# Patient Record
Sex: Female | Born: 1945 | Race: White | Hispanic: No | Marital: Married | Smoking: Former smoker
Health system: Southern US, Community
[De-identification: ages and names within clinical notes are randomized; demographics above are authoritative.]

## PROBLEM LIST (undated history)

## (undated) DIAGNOSIS — C3491 Malignant neoplasm of unspecified part of right bronchus or lung: Secondary | ICD-10-CM

## (undated) DIAGNOSIS — M545 Low back pain, unspecified: Secondary | ICD-10-CM

## (undated) DIAGNOSIS — M1990 Unspecified osteoarthritis, unspecified site: Secondary | ICD-10-CM

## (undated) HISTORY — PX: NO PAST SURGERIES: SHX2092

## (undated) HISTORY — DX: Malignant neoplasm of unspecified part of right bronchus or lung: C34.91

---

## 2011-12-09 ENCOUNTER — Encounter (HOSPITAL_COMMUNITY): Payer: Self-pay

## 2011-12-09 ENCOUNTER — Emergency Department (HOSPITAL_COMMUNITY)
Admission: EM | Admit: 2011-12-09 | Discharge: 2011-12-09 | Disposition: A | Discharge status: Home / Self Care | Payer: Medicare Other | Attending: Emergency Medicine | Admitting: Emergency Medicine

## 2011-12-09 DIAGNOSIS — F17200 Nicotine dependence, unspecified, uncomplicated: Secondary | ICD-10-CM | POA: Insufficient documentation

## 2011-12-09 DIAGNOSIS — K0889 Other specified disorders of teeth and supporting structures: Secondary | ICD-10-CM

## 2011-12-09 DIAGNOSIS — K029 Dental caries, unspecified: Secondary | ICD-10-CM | POA: Insufficient documentation

## 2011-12-09 MED ORDER — OXYCODONE-ACETAMINOPHEN 5-325 MG PO TABS: 1.0000 | ORAL_TABLET | Freq: Four times a day (QID) | ORAL | Status: AC | PRN

## 2011-12-09 MED ORDER — PENICILLIN V POTASSIUM 500 MG PO TABS: 500.0000 mg | ORAL_TABLET | Freq: Three times a day (TID) | ORAL | Status: AC

## 2011-12-09 MED ORDER — IBUPROFEN 600 MG PO TABS: 600.0000 mg | ORAL_TABLET | Freq: Four times a day (QID) | ORAL | Status: AC | PRN

## 2011-12-09 NOTE — ED Notes (Signed)
Pt complains of dental pain sts broken left frontal tooth and now infected, occurred thursdya

## 2011-12-09 NOTE — ED Provider Notes (Signed)
History  Scribed for Ethelda Chick, MD, the patient was seen in room TR06C/TR06C. This chart was scribed by Candelaria Stagers. The patient's care started at 1:19 PM   CSN: [id number]  Arrival date & time 12/09/11  1254   First MD Initiated Contact with Patient 12/09/11 1312      Chief Complaint  Patient presents with  . Dental Pain    The history is provided by the patient.   Julia Nguyen is a 66 y.o. female who presents to the Emergency Department complaining of left sided dental pain that started three days ago.  Pt reports that the tooth is broken.  She denies fevers, trouble swallowing.  Nothing seems to make the pain better or worse.  Sh has tried tylenol prior to arrival that did not help with the pain.  There are no other associated systemic symptoms, there are no other alleviating or modifying factors.  No past medical history on file.  No past surgical history on file.  No family history on file.  History  Substance Use Topics  . Smoking status: Current Everyday Smoker  . Smokeless tobacco: Not on file  . Alcohol Use: No    OB History    Grav Para Term Preterm Abortions TAB SAB Ect Mult Living                  Review of Systems  HENT: Positive for dental problem. Negative for trouble swallowing and neck pain.   All other systems reviewed and are negative.    Allergies  Review of patient's allergies indicates no known allergies.  Home Medications   Current Outpatient Rx  Name Route Sig Dispense Refill  . ACETAMINOPHEN 500 MG PO TABS Oral Take 1,000 mg by mouth every 6 (six) hours as needed. pain    . IBUPROFEN 600 MG PO TABS Oral Take 1 tablet (600 mg total) by mouth every 6 (six) hours as needed for pain. 30 tablet 0  . OXYCODONE-ACETAMINOPHEN 5-325 MG PO TABS Oral Take 1-2 tablets by mouth every 6 (six) hours as needed for pain. 15 tablet 0  . PENICILLIN V POTASSIUM 500 MG PO TABS Oral Take 1 tablet (500 mg total) by mouth 3 (three) times daily. 30  tablet 0    BP 152/71  Pulse 93  Temp 98.5 F (36.9 C) (Oral)  SpO2 94%  Physical Exam  Nursing note and vitals reviewed. Constitutional: She is oriented to person, place, and time. She appears well-developed and well-nourished. No distress.  HENT:  Head: Normocephalic and atraumatic.       Left lower premolars eroded to the gumline with gingival swelling and buckle surface.  No swelling under the tongue.  Diffuse decay.    Eyes: Conjunctivae are normal.  Musculoskeletal: Normal range of motion. She exhibits no tenderness.  Neurological: She is alert and oriented to person, place, and time.  Skin: Skin is warm and dry. She is not diaphoretic.  Psychiatric: She has a normal mood and affect. Her behavior is normal.    ED Course  Procedures  DIAGNOSTIC STUDIES: Oxygen Saturation is 94% on room air, normal by my interpretation.    COORDINATION OF CARE:    Labs Reviewed - No data to display No results found.   1. Pain, dental       MDM  Pt presenting with dental pain, she has poor dentition and tooth causing pain is broken down to the gumline.  She is overall nontoxic and well hydrated  in appearance.  She was discharged with rx for pain meds, antibiotics.  Encouraged to arrange followup with dentist for definitive care.  Discharged with strict return precautions.  Pt agreeable with plan.   I personally performed the services described in this documentation, which was scribed in my presence. The recorded information has been reviewed and considered.        Ethelda Chick, MD 12/09/11 1352

## 2017-08-19 ENCOUNTER — Other Ambulatory Visit: Payer: Self-pay

## 2017-08-19 ENCOUNTER — Emergency Department (HOSPITAL_COMMUNITY)
Admission: EM | Admit: 2017-08-19 | Discharge: 2017-08-19 | Disposition: A | Discharge status: Home / Self Care | Payer: Medicare Other | Attending: Emergency Medicine | Admitting: Emergency Medicine

## 2017-08-19 DIAGNOSIS — F17200 Nicotine dependence, unspecified, uncomplicated: Secondary | ICD-10-CM | POA: Diagnosis not present

## 2017-08-19 DIAGNOSIS — M5432 Sciatica, left side: Secondary | ICD-10-CM

## 2017-08-19 DIAGNOSIS — M545 Low back pain: Secondary | ICD-10-CM | POA: Diagnosis present

## 2017-08-19 MED ORDER — TRAMADOL HCL 50 MG PO TABS: 50.0000 mg | ORAL_TABLET | Freq: Four times a day (QID) | ORAL | 0 refills | Status: DC | PRN

## 2017-08-19 MED ORDER — TRAMADOL HCL 50 MG PO TABS
50.0000 mg | ORAL_TABLET | Freq: Once | ORAL | Status: AC
  Administered 2017-08-19: 50 mg via ORAL
  Filled 2017-08-19: qty 1

## 2017-08-19 MED ORDER — METHOCARBAMOL 500 MG PO TABS: 500.0000 mg | ORAL_TABLET | Freq: Two times a day (BID) | ORAL | 0 refills | Status: DC

## 2017-08-19 NOTE — ED Triage Notes (Signed)
Patient here with ongoing back pain for approx the last five weeks; states she first felt a pull on her lower back when giving herself a pedicure.  Here today for a particularly painful back spasm last night  Denies numbness/tingling

## 2017-08-19 NOTE — ED Provider Notes (Signed)
[facility] Provider Note   CSN: [id number] Arrival date & time: 08/19/17  1353     History   Chief Complaint Chief Complaint  Patient presents with  . Back Pain    HPI Julia Nguyen is a 72 y.o. female.  HPI   72 year old female presents today with complaints of left lower back pain.  She notes a 5-week history of ongoing left lower back pain.  She notes that she had crossed her left leg over her right and wedged under table.  She notes feeling a "tearing sensation".  She notes some soreness after this but then resolved without comp occasion.  She notes several weeks later symptoms again returned with severe left lower back pain with radiation into the buttocks.  She denies any loss of distal sensation strength and motor function, normal bowel and bladder movements.  Patient reports using over-the-counter medications without symptom medic improvement.  She denies any fever or acute trauma.  No abdominal pain.  Patient notes she has had outpatient x-rays with no significant findings.  No past medical history on file.  There are no active problems to display for this patient.   OB History   None      Home Medications    Prior to Admission medications   Medication Sig Start Date End Date Taking? Authorizing Provider  acetaminophen (TYLENOL) 500 MG tablet Take 1,000 mg by mouth every 6 (six) hours as needed. pain    [provider]  methocarbamol (ROBAXIN) 500 MG tablet Take 1 tablet (500 mg total) by mouth 2 (two) times daily. 08/19/17   Cindel Daugherty, Dellis Filbert, PA-C  traMADol (ULTRAM) 50 MG tablet Take 1 tablet (50 mg total) by mouth every 6 (six) hours as needed. 08/19/17   Okey Regal, PA-C    Family History No family history on file.  Social History Social History   Tobacco Use  . Smoking status: Current Every Day Smoker  Substance Use Topics  . Alcohol use: No  . Drug use: No     Allergies   Patient has no known  allergies.   Review of Systems Review of Systems  All other systems reviewed and are negative.    Physical Exam Updated Vital Signs BP (!) 155/72   Pulse 85   Temp 98.5 F (36.9 C)   Resp 16   Ht 5\' 5"  (1.651 m)   Wt 68 kg (150 lb)   SpO2 99%   BMI 24.96 kg/m   Physical Exam  Constitutional: She is oriented to person, place, and time. She appears well-developed and well-nourished.  HENT:  Head: Normocephalic and atraumatic.  Eyes: Pupils are equal, round, and reactive to light. Conjunctivae are normal. Right eye exhibits no discharge. Left eye exhibits no discharge. No scleral icterus.  Neck: Normal range of motion. No JVD present. No tracheal deviation present.  Pulmonary/Chest: Effort normal. No stridor.  Abdominal: Soft. She exhibits no distension and no mass. There is no tenderness. There is no rebound and no guarding. No hernia.  Musculoskeletal:  No CT or L-spine tenderness palpation, tenderness palpation left lateral musculature, left posterior hip and gluteus-bilateral lower extremity sensation strength motor function intact  Neurological: She is alert and oriented to person, place, and time. Coordination normal.  Psychiatric: She has a normal mood and affect. Her behavior is normal. Judgment and thought content normal.  Nursing note and vitals reviewed.    ED Treatments / Results  Labs (all labs ordered are listed,  but only abnormal results are displayed) Labs Reviewed - No data to display  EKG None  Radiology No results found.  Procedures Procedures (including critical care time)  Medications Ordered in ED Medications  traMADol (ULTRAM) tablet 50 mg (50 mg Oral Given 08/19/17 1750)     Initial Impression / Assessment and Plan / ED Course  I have reviewed the triage vital signs and the nursing notes.  Pertinent labs & imaging results that were available during my care of the patient were reviewed by me and considered in my medical decision making  (see chart for details).      Final Clinical Impressions(s) / ED Diagnoses   Final diagnoses:  Sciatica of left side    Labs:   Imaging:  Consults:  Therapeutics:  Discharge Meds: Robaxin, Ultram  Assessment/Plan: 72 year old female presents today with likely sciatica.  Patient has outpatient imaging with no acute findings.  Continuation of symptoms.  Patient will be started on Ultram, Robaxin, and referred to neurosurgery for ongoing evaluation and management.  No red flags here today requiring further imaging.  She is given strict return precautions, she verbalized understanding and agreement to today's plan had no further questions or concerns.   ED Discharge Orders        Ordered    traMADol (ULTRAM) 50 MG tablet  Every 6 hours PRN     08/19/17 1743    methocarbamol (ROBAXIN) 500 MG tablet  2 times daily     08/19/17 1743       Francee Gentile 08/20/17 1312    Davonna Belling, MD 08/20/17 2214

## 2017-08-19 NOTE — Discharge Instructions (Addendum)
Please read attached information. If you experience any new or worsening signs or symptoms please return to the emergency room for evaluation. Please follow-up with your primary care provider or specialist as discussed. Please use medication prescribed only as directed and discontinue taking if you have any concerning signs or symptoms.   °

## 2017-08-19 NOTE — ED Triage Notes (Signed)
States feels "hot and cold" in right leg

## 2017-09-05 ENCOUNTER — Encounter (HOSPITAL_COMMUNITY): Payer: Self-pay | Admitting: Emergency Medicine

## 2017-09-05 ENCOUNTER — Other Ambulatory Visit: Payer: Self-pay

## 2017-09-05 ENCOUNTER — Emergency Department (HOSPITAL_COMMUNITY)
Admission: EM | Admit: 2017-09-05 | Discharge: 2017-09-05 | Disposition: A | Discharge status: Home / Self Care | Payer: Medicare Other | Attending: Emergency Medicine | Admitting: Emergency Medicine

## 2017-09-05 DIAGNOSIS — M5432 Sciatica, left side: Secondary | ICD-10-CM | POA: Insufficient documentation

## 2017-09-05 DIAGNOSIS — Z79899 Other long term (current) drug therapy: Secondary | ICD-10-CM | POA: Diagnosis not present

## 2017-09-05 DIAGNOSIS — M545 Low back pain: Secondary | ICD-10-CM | POA: Diagnosis present

## 2017-09-05 DIAGNOSIS — F17200 Nicotine dependence, unspecified, uncomplicated: Secondary | ICD-10-CM | POA: Diagnosis not present

## 2017-09-05 LAB — URINALYSIS, ROUTINE W REFLEX MICROSCOPIC
Bacteria, UA: NONE SEEN
Bilirubin Urine: NEGATIVE
Glucose, UA: NEGATIVE mg/dL
Ketones, ur: 20 mg/dL — AB
Nitrite: NEGATIVE
PH: 5 (ref 5.0–8.0)
PROTEIN: NEGATIVE mg/dL
Specific Gravity, Urine: 1.019 (ref 1.005–1.030)

## 2017-09-05 MED ORDER — LIDOCAINE 5 % EX PTCH: 1.0000 | MEDICATED_PATCH | CUTANEOUS | 0 refills | Status: DC

## 2017-09-05 MED ORDER — CEPHALEXIN 500 MG PO CAPS: 500.0000 mg | ORAL_CAPSULE | Freq: Four times a day (QID) | ORAL | 0 refills | Status: DC

## 2017-09-05 MED ORDER — METHOCARBAMOL 1000 MG/10ML IJ SOLN: 1000.0000 mg | Freq: Once | INTRAMUSCULAR | Status: DC

## 2017-09-05 MED ORDER — MINERAL OIL RE ENEM
1.0000 | ENEMA | Freq: Once | RECTAL | Status: AC
  Administered 2017-09-05: 1 via RECTAL
  Filled 2017-09-05: qty 1

## 2017-09-05 MED ORDER — METHOCARBAMOL 500 MG PO TABS
750.0000 mg | ORAL_TABLET | Freq: Once | ORAL | Status: AC
  Administered 2017-09-05: 750 mg via ORAL
  Filled 2017-09-05: qty 2

## 2017-09-05 MED ORDER — KETOROLAC TROMETHAMINE 60 MG/2ML IM SOLN
30.0000 mg | Freq: Once | INTRAMUSCULAR | Status: AC
  Administered 2017-09-05: 30 mg via INTRAMUSCULAR
  Filled 2017-09-05: qty 2

## 2017-09-05 MED ORDER — METHOCARBAMOL 500 MG PO TABS: 500.0000 mg | ORAL_TABLET | Freq: Three times a day (TID) | ORAL | 0 refills | Status: DC | PRN

## 2017-09-05 MED ORDER — LIDOCAINE 5 % EX PTCH
1.0000 | MEDICATED_PATCH | CUTANEOUS | Status: DC
Start: 2017-09-05 — End: 2017-09-06
  Administered 2017-09-05: 1 via TRANSDERMAL
  Filled 2017-09-05: qty 1

## 2017-09-05 NOTE — ED Triage Notes (Signed)
Pt to ED with c/o back pain.  Pt st's unable to sit.  Pt also st's pain meds she is taking are not helping.  Pt also c/o constipation

## 2017-09-05 NOTE — ED Triage Notes (Signed)
Pt st's she has sciatica on the left and is having a lot of pain.  St's she is unable to sit down.  Pt has appointment with Neurosurgery and [facility] on Monday but can't wait that long.  Pt also c/o constipation.

## 2017-09-05 NOTE — ED Notes (Signed)
Patient verbalized understanding of discharge instructions and denies any further needs or questions at this time. VS stable. Patient ambulatory with steady gait.  

## 2017-09-05 NOTE — ED Provider Notes (Signed)
[facility]PART[facility] Provider Note   CSN: [id number] Arrival date & time: 09/05/17  1941     History   Chief Complaint Chief Complaint  Patient presents with  . Back Pain    HPI Julia Nguyen is a 72 y.o. female.  Patient is a 72 year old female with no significant past medical history who is presenting today with 8 weeks of ongoing back pain that is not getting any better.  Patient states that the pain is in the lower back and radiates off to the left side causing severe spasm.  She has pain that shoots down into her knee and numbness and weakness in her left leg.  Patient states that this is not significantly different from when the pain started.  She is also having pain that radiates over into the right side as well.  She states she is having a large bouts of urination and intermittent incontinence which she states is not terribly different than her baseline.  However now she is also feeling constipated.  Patient's last bowel movement was on Tuesday if she feels that she can just not have a bowel movement despite trying to take some stool softeners.  She is having to strain in her back is so uncomfortable she is having a hard time doing that.  She denies fever, abdominal pain.  Patient has a planned appointment with neurosurgery on Monday and has not had any imaging thus far.  She has been using Aleve and Robaxin at home with only minimal improvement.  She tried steroids when symptoms initially started without improvement of symptoms.  The history is provided by the patient and the spouse.    History reviewed. No pertinent past medical history.  There are no active problems to display for this patient.   History reviewed. No pertinent surgical history.   OB History   None      Home Medications    Prior to Admission medications   Medication Sig Start Date End Date Taking? Authorizing Provider  Meclizine HCl (BONINE) 25 MG CHEW Chew 1 tablet by  mouth as needed.   Yes [provider]  methocarbamol (ROBAXIN) 500 MG tablet Take 1 tablet (500 mg total) by mouth 2 (two) times daily. 08/19/17  Yes Hedges, Dellis Filbert, PA-C  naproxen sodium (ALEVE) 220 MG tablet Take 440 mg by mouth.   Yes [provider]  traMADol (ULTRAM) 50 MG tablet Take 1 tablet (50 mg total) by mouth every 6 (six) hours as needed. Patient not taking: Reported on 09/05/2017 08/19/17   Okey Regal, PA-C    Family History No family history on file.  Social History Social History   Tobacco Use  . Smoking status: Current Every Day Smoker  . Smokeless tobacco: Never Used  Substance Use Topics  . Alcohol use: No  . Drug use: No     Allergies   Patient has no known allergies.   Review of Systems Review of Systems  Genitourinary:       Strong smelling urine  All other systems reviewed and are negative.    Physical Exam Updated Vital Signs BP (!) 136/45 (BP Location: Left Arm)   Pulse 77   Temp 98.3 F (36.8 C) (Oral)   Resp 16   Ht 5\' 5"  (1.651 m)   Wt 69.9 kg (154 lb)   SpO2 99%   BMI 25.63 kg/m   Physical Exam  Constitutional: She is oriented to person, place, and time. She appears well-developed  and well-nourished. She appears distressed.  Looks uncomfortable  HENT:  Head: Normocephalic and atraumatic.  Mouth/Throat: Oropharynx is clear and moist.  Eyes: Pupils are equal, round, and reactive to light. Conjunctivae and EOM are normal.  Neck: Normal range of motion. Neck supple.  Cardiovascular: Normal rate, regular rhythm and intact distal pulses.  No murmur heard. Pulmonary/Chest: Effort normal and breath sounds normal. No respiratory distress. She has no wheezes. She has no rales.  Abdominal: Soft. She exhibits no distension. There is no tenderness. There is no rebound and no guarding.  Musculoskeletal: She exhibits tenderness. She exhibits no edema.       Lumbar back: She exhibits decreased range of motion, tenderness,  pain and spasm. She exhibits normal pulse.       Back:  Neurological: She is alert and oriented to person, place, and time.  Decreased sensation in the left lower ext to pin prick.  5/5 foot dorsiflex/plantar flexion, 3/5 strength in the quadricep muscle.  No foot drop but gait is slow and limp with the left leg.  Rectal tone intact and minimal urine in her bladder after voiding.  Skin: Skin is warm and dry. No rash noted. No erythema.  Psychiatric: She has a normal mood and affect. Her behavior is normal.  Nursing note and vitals reviewed.    ED Treatments / Results  Labs (all labs ordered are listed, but only abnormal results are displayed) Labs Reviewed  URINALYSIS, ROUTINE W REFLEX MICROSCOPIC - Abnormal; Notable for the following components:      Result Value   Hgb urine dipstick MODERATE (*)    Ketones, ur 20 (*)    Leukocytes, UA LARGE (*)    Squamous Epithelial / LPF 0-5 (*)    All other components within normal limits  URINE CULTURE    EKG None  Radiology No results found.  Procedures Procedures (including critical care time)  Medications Ordered in ED Medications  mineral oil enema 1 enema (has no administration in time range)  lidocaine (LIDODERM) 5 % 1 patch (has no administration in time range)  methocarbamol (ROBAXIN) injection 1,000 mg (has no administration in time range)  ketorolac (TORADOL) injection 30 mg (30 mg Intramuscular Given 09/05/17 2220)     Initial Impression / Assessment and Plan / ED Course  I have reviewed the triage vital signs and the nursing notes.  Pertinent labs & imaging results that were available during my care of the patient were reviewed by me and considered in my medical decision making (see chart for details).     Pt with gradual onset of back pain suggestive of radiculopathy.  Some weakness in the left lower leg but unchanged from the last month and no incontinence with good rectal tone.  Pt has no infectious sx, hx of CA   or other red flags concerning for pathologic back pain.  Pt is able to ambulate but is painful.   Denies trauma.  Patient has follow-up with neurosurgery on Monday however because of the pain, constipation she states she just could not take it anymore.  Offered patient an MRI but she is refusing because she is claustrophobic.  Will treat pain with Toradol and Robaxin as she is refusing narcotics because they make her feel weird and do not help with the pain.  We will also try Lidoderm patch.  Feel that patient has a bulging disc and nerve impingement but is not displaying signs of cauda equina at this time.  Patient's vital signs are  reassuring and low suspicion for AAA. Will give pt pain control and to return for developement of above sx.  Pt is requesting discharge states she can't stay any longer  11:42 PM Patient's urine returned and questionable urinary tract infection with large leukocytes, 6-30 white blood cells and moderate hemoglobin.  Patient called in a prescription for Keflex and cultures sent.  Final Clinical Impressions(s) / ED Diagnoses   Final diagnoses:  Sciatica of left side    ED Discharge Orders        Ordered    lidocaine (LIDODERM) 5 %  Every 24 hours     09/05/17 2303    methocarbamol (ROBAXIN) 500 MG tablet  Every 8 hours PRN     09/05/17 2303    cephALEXin (KEFLEX) 500 MG capsule  4 times daily     09/05/17 2344       Blanchie Dessert, MD 09/05/17 2344

## 2017-09-05 NOTE — ED Notes (Signed)
Patient assisted to restroom in wheelchair for urine sample. Patient began having muscle spasm while attempting to sit on toilet. Provided heat pack for a few minutes until spasm resolved. Patient ambulated back to room with slow, steady gait.

## 2017-09-12 ENCOUNTER — Other Ambulatory Visit: Payer: Self-pay | Admitting: Neurosurgery

## 2017-09-12 DIAGNOSIS — M549 Dorsalgia, unspecified: Secondary | ICD-10-CM

## 2017-09-15 ENCOUNTER — Ambulatory Visit
Admission: RE | Admit: 2017-09-15 | Discharge: 2017-09-15 | Disposition: A | Discharge status: Home / Self Care | Payer: Medicare Other | Source: Ambulatory Visit | Attending: Neurosurgery | Admitting: Neurosurgery

## 2017-09-15 DIAGNOSIS — M549 Dorsalgia, unspecified: Secondary | ICD-10-CM

## 2017-09-17 ENCOUNTER — Inpatient Hospital Stay (HOSPITAL_COMMUNITY)
Admission: AD | Admit: 2017-09-17 | Discharge: 2017-09-30 | DRG: 478 | Disposition: A | Discharge status: Skilled Nursing Facility | Payer: Medicare Other | Source: Ambulatory Visit | Attending: Internal Medicine | Admitting: Internal Medicine

## 2017-09-17 ENCOUNTER — Encounter (HOSPITAL_COMMUNITY): Payer: Self-pay | Admitting: *Deleted

## 2017-09-17 ENCOUNTER — Observation Stay (HOSPITAL_COMMUNITY): Payer: Medicare Other

## 2017-09-17 DIAGNOSIS — N2889 Other specified disorders of kidney and ureter: Secondary | ICD-10-CM | POA: Diagnosis not present

## 2017-09-17 DIAGNOSIS — C3491 Malignant neoplasm of unspecified part of right bronchus or lung: Secondary | ICD-10-CM | POA: Diagnosis not present

## 2017-09-17 DIAGNOSIS — C3411 Malignant neoplasm of upper lobe, right bronchus or lung: Secondary | ICD-10-CM | POA: Diagnosis present

## 2017-09-17 DIAGNOSIS — C799 Secondary malignant neoplasm of unspecified site: Secondary | ICD-10-CM

## 2017-09-17 DIAGNOSIS — D497 Neoplasm of unspecified behavior of endocrine glands and other parts of nervous system: Secondary | ICD-10-CM | POA: Diagnosis not present

## 2017-09-17 DIAGNOSIS — C729 Malignant neoplasm of central nervous system, unspecified: Secondary | ICD-10-CM | POA: Diagnosis not present

## 2017-09-17 DIAGNOSIS — K5900 Constipation, unspecified: Secondary | ICD-10-CM | POA: Diagnosis present

## 2017-09-17 DIAGNOSIS — R011 Cardiac murmur, unspecified: Secondary | ICD-10-CM | POA: Diagnosis not present

## 2017-09-17 DIAGNOSIS — Z801 Family history of malignant neoplasm of trachea, bronchus and lung: Secondary | ICD-10-CM | POA: Diagnosis not present

## 2017-09-17 DIAGNOSIS — R590 Localized enlarged lymph nodes: Secondary | ICD-10-CM | POA: Diagnosis not present

## 2017-09-17 DIAGNOSIS — G893 Neoplasm related pain (acute) (chronic): Secondary | ICD-10-CM | POA: Diagnosis present

## 2017-09-17 DIAGNOSIS — L8990 Pressure ulcer of unspecified site, unspecified stage: Secondary | ICD-10-CM | POA: Diagnosis present

## 2017-09-17 DIAGNOSIS — S32001A Stable burst fracture of unspecified lumbar vertebra, initial encounter for closed fracture: Secondary | ICD-10-CM | POA: Diagnosis present

## 2017-09-17 DIAGNOSIS — C7949 Secondary malignant neoplasm of other parts of nervous system: Secondary | ICD-10-CM | POA: Diagnosis not present

## 2017-09-17 DIAGNOSIS — K5909 Other constipation: Secondary | ICD-10-CM

## 2017-09-17 DIAGNOSIS — N289 Disorder of kidney and ureter, unspecified: Secondary | ICD-10-CM | POA: Diagnosis not present

## 2017-09-17 DIAGNOSIS — R6881 Early satiety: Secondary | ICD-10-CM | POA: Diagnosis not present

## 2017-09-17 DIAGNOSIS — J439 Emphysema, unspecified: Secondary | ICD-10-CM | POA: Diagnosis present

## 2017-09-17 DIAGNOSIS — M8448XA Pathological fracture, other site, initial encounter for fracture: Secondary | ICD-10-CM | POA: Diagnosis present

## 2017-09-17 DIAGNOSIS — R42 Dizziness and giddiness: Secondary | ICD-10-CM | POA: Diagnosis present

## 2017-09-17 DIAGNOSIS — S32001D Stable burst fracture of unspecified lumbar vertebra, subsequent encounter for fracture with routine healing: Secondary | ICD-10-CM | POA: Diagnosis not present

## 2017-09-17 DIAGNOSIS — F17210 Nicotine dependence, cigarettes, uncomplicated: Secondary | ICD-10-CM | POA: Diagnosis present

## 2017-09-17 DIAGNOSIS — C3490 Malignant neoplasm of unspecified part of unspecified bronchus or lung: Secondary | ICD-10-CM

## 2017-09-17 DIAGNOSIS — K639 Disease of intestine, unspecified: Secondary | ICD-10-CM | POA: Diagnosis present

## 2017-09-17 DIAGNOSIS — D72829 Elevated white blood cell count, unspecified: Secondary | ICD-10-CM | POA: Diagnosis present

## 2017-09-17 DIAGNOSIS — R911 Solitary pulmonary nodule: Secondary | ICD-10-CM | POA: Diagnosis not present

## 2017-09-17 DIAGNOSIS — S32001S Stable burst fracture of unspecified lumbar vertebra, sequela: Secondary | ICD-10-CM

## 2017-09-17 DIAGNOSIS — K6389 Other specified diseases of intestine: Secondary | ICD-10-CM

## 2017-09-17 DIAGNOSIS — T380X5A Adverse effect of glucocorticoids and synthetic analogues, initial encounter: Secondary | ICD-10-CM | POA: Diagnosis present

## 2017-09-17 DIAGNOSIS — C7952 Secondary malignant neoplasm of bone marrow: Secondary | ICD-10-CM

## 2017-09-17 DIAGNOSIS — M8458XA Pathological fracture in neoplastic disease, other specified site, initial encounter for fracture: Principal | ICD-10-CM | POA: Diagnosis present

## 2017-09-17 DIAGNOSIS — M8448XP Pathological fracture, other site, subsequent encounter for fracture with malunion: Secondary | ICD-10-CM | POA: Diagnosis not present

## 2017-09-17 DIAGNOSIS — C7951 Secondary malignant neoplasm of bone: Secondary | ICD-10-CM

## 2017-09-17 HISTORY — DX: Low back pain: M54.5

## 2017-09-17 HISTORY — DX: Low back pain, unspecified: M54.50

## 2017-09-17 HISTORY — DX: Unspecified osteoarthritis, unspecified site: M19.90

## 2017-09-17 LAB — COMPREHENSIVE METABOLIC PANEL
ALBUMIN: 3.1 g/dL — AB (ref 3.5–5.0)
ALK PHOS: 71 U/L (ref 38–126)
ALT: 22 U/L (ref 14–54)
AST: 19 U/L (ref 15–41)
Anion gap: 10 (ref 5–15)
BUN: 16 mg/dL (ref 6–20)
CHLORIDE: 102 mmol/L (ref 101–111)
CO2: 24 mmol/L (ref 22–32)
Calcium: 9.3 mg/dL (ref 8.9–10.3)
Creatinine, Ser: 0.76 mg/dL (ref 0.44–1.00)
GFR calc non Af Amer: >60 mL/min (ref >60)
GLUCOSE: 140 mg/dL — AB (ref 65–99)
POTASSIUM: 4.2 mmol/L (ref 3.5–5.1)
SODIUM: 136 mmol/L (ref 135–145)
Total Bilirubin: 0.4 mg/dL (ref 0.3–1.2)
Total Protein: 6 g/dL — ABNORMAL LOW (ref 6.5–8.1)

## 2017-09-17 LAB — CBC
HCT: 46.4 % — ABNORMAL HIGH (ref 36.0–46.0)
HEMOGLOBIN: 15.8 g/dL — AB (ref 12.0–15.0)
MCH: 30.3 pg (ref 26.0–34.0)
MCHC: 34.1 g/dL (ref 30.0–36.0)
MCV: 88.9 fL (ref 78.0–100.0)
PLATELETS: 257 10*3/uL (ref 150–400)
RBC: 5.22 MIL/uL — ABNORMAL HIGH (ref 3.87–5.11)
RDW: 12.6 % (ref 11.5–15.5)
WBC: 14.3 10*3/uL — ABNORMAL HIGH (ref 4.0–10.5)

## 2017-09-17 LAB — MRSA PCR SCREENING: MRSA BY PCR: NEGATIVE

## 2017-09-17 MED ORDER — CYCLOBENZAPRINE HCL 10 MG PO TABS
10.0000 mg | ORAL_TABLET | Freq: Three times a day (TID) | ORAL | Status: DC | PRN
  Administered 2017-09-17 – 2017-09-25 (×7): 10 mg via ORAL
  Filled 2017-09-17 (×7): qty 1

## 2017-09-17 MED ORDER — OXYCODONE HCL 5 MG PO TABS
15.0000 mg | ORAL_TABLET | ORAL | Status: DC | PRN
  Administered 2017-09-19 – 2017-09-23 (×5): 15 mg via ORAL
  Filled 2017-09-17 (×5): qty 3

## 2017-09-17 MED ORDER — ONDANSETRON HCL 4 MG/2ML IJ SOLN: 4.0000 mg | Freq: Four times a day (QID) | INTRAMUSCULAR | Status: DC | PRN

## 2017-09-17 MED ORDER — CEPHALEXIN 500 MG PO CAPS: 500.0000 mg | ORAL_CAPSULE | Freq: Four times a day (QID) | ORAL | Status: DC

## 2017-09-17 MED ORDER — METHOCARBAMOL 500 MG PO TABS: 500.0000 mg | ORAL_TABLET | Freq: Three times a day (TID) | ORAL | Status: DC | PRN

## 2017-09-17 MED ORDER — PANTOPRAZOLE SODIUM 40 MG PO TBEC
80.0000 mg | DELAYED_RELEASE_TABLET | Freq: Two times a day (BID) | ORAL | Status: DC
  Administered 2017-09-17 – 2017-09-30 (×26): 80 mg via ORAL
  Filled 2017-09-17 (×27): qty 2

## 2017-09-17 MED ORDER — ACETAMINOPHEN 650 MG RE SUPP: 650.0000 mg | RECTAL | Status: DC | PRN

## 2017-09-17 MED ORDER — MECLIZINE HCL 25 MG PO TABS: 25.0000 mg | ORAL_TABLET | ORAL | Status: DC | PRN

## 2017-09-17 MED ORDER — FAMOTIDINE IN NACL 20-0.9 MG/50ML-% IV SOLN
20.0000 mg | Freq: Two times a day (BID) | INTRAVENOUS | Status: DC
  Administered 2017-09-17: 20 mg via INTRAVENOUS
  Filled 2017-09-17: qty 50

## 2017-09-17 MED ORDER — PHENOL 1.4 % MT LIQD
1.0000 | OROMUCOSAL | Status: DC | PRN
  Filled 2017-09-17: qty 177

## 2017-09-17 MED ORDER — TRAMADOL HCL 50 MG PO TABS
50.0000 mg | ORAL_TABLET | Freq: Four times a day (QID) | ORAL | Status: DC | PRN
  Administered 2017-09-17 – 2017-09-24 (×14): 50 mg via ORAL
  Filled 2017-09-17 (×14): qty 1

## 2017-09-17 MED ORDER — ALUM & MAG HYDROXIDE-SIMETH 200-200-20 MG/5ML PO SUSP: 30.0000 mL | Freq: Four times a day (QID) | ORAL | Status: DC | PRN

## 2017-09-17 MED ORDER — ONDANSETRON HCL 4 MG PO TABS: 4.0000 mg | ORAL_TABLET | Freq: Four times a day (QID) | ORAL | Status: DC | PRN

## 2017-09-17 MED ORDER — SODIUM CHLORIDE 0.9% FLUSH: 3.0000 mL | INTRAVENOUS | Status: DC | PRN

## 2017-09-17 MED ORDER — LIDOCAINE 5 % EX PTCH: 1.0000 | MEDICATED_PATCH | CUTANEOUS | Status: DC

## 2017-09-17 MED ORDER — SODIUM CHLORIDE 0.9% FLUSH
3.0000 mL | Freq: Two times a day (BID) | INTRAVENOUS | Status: DC
  Administered 2017-09-17 – 2017-09-30 (×25): 3 mL via INTRAVENOUS

## 2017-09-17 MED ORDER — NICOTINE 14 MG/24HR TD PT24
14.0000 mg | MEDICATED_PATCH | Freq: Every day | TRANSDERMAL | Status: DC
  Administered 2017-09-18 – 2017-09-30 (×13): 14 mg via TRANSDERMAL
  Filled 2017-09-17 (×13): qty 1

## 2017-09-17 MED ORDER — DEXAMETHASONE SODIUM PHOSPHATE 10 MG/ML IJ SOLN
10.0000 mg | Freq: Four times a day (QID) | INTRAMUSCULAR | Status: DC
  Administered 2017-09-17 – 2017-09-19 (×7): 10 mg via INTRAVENOUS
  Filled 2017-09-17 (×7): qty 1

## 2017-09-17 MED ORDER — IOPAMIDOL (ISOVUE-300) INJECTION 61%
INTRAVENOUS | Status: AC
  Administered 2017-09-17: 23:00:00
  Filled 2017-09-17: qty 100

## 2017-09-17 MED ORDER — IOPAMIDOL (ISOVUE-300) INJECTION 61%
100.0000 mL | Freq: Once | INTRAVENOUS | Status: AC | PRN
  Administered 2017-09-17: 100 mL via INTRAVENOUS

## 2017-09-17 MED ORDER — HYDROMORPHONE HCL 1 MG/ML IJ SOLN
1.0000 mg | INTRAMUSCULAR | Status: DC | PRN
  Administered 2017-09-20: 1 mg via INTRAVENOUS
  Filled 2017-09-17 (×2): qty 1

## 2017-09-17 MED ORDER — ACETAMINOPHEN 325 MG PO TABS
650.0000 mg | ORAL_TABLET | ORAL | Status: DC | PRN
  Administered 2017-09-27 – 2017-09-30 (×3): 650 mg via ORAL
  Filled 2017-09-17 (×4): qty 2

## 2017-09-17 MED ORDER — METHOCARBAMOL 500 MG PO TABS
500.0000 mg | ORAL_TABLET | Freq: Two times a day (BID) | ORAL | Status: DC
  Administered 2017-09-17 – 2017-09-30 (×26): 500 mg via ORAL
  Filled 2017-09-17 (×26): qty 1

## 2017-09-17 MED ORDER — ENSURE ENLIVE PO LIQD
237.0000 mL | Freq: Two times a day (BID) | ORAL | Status: DC
  Administered 2017-09-18 – 2017-09-30 (×12): 237 mL via ORAL

## 2017-09-17 MED ORDER — MENTHOL 3 MG MT LOZG
1.0000 | LOZENGE | OROMUCOSAL | Status: DC | PRN
  Filled 2017-09-17: qty 9

## 2017-09-17 MED ORDER — SODIUM CHLORIDE 0.9 % IV SOLN: 250.0000 mL | INTRAVENOUS | Status: DC

## 2017-09-17 NOTE — Consult Note (Addendum)
Date: 09/17/2017               Patient Name:  Julia Nguyen MRN: [id number]  DOB: [birth year] Age / Sex: 72 y.o., female   PCP: Patient, No Pcp Per         Requesting Physician: Dr. Kary Kos, MD    Consulting Reason:  Oncology workup     Chief Complaint: lower back pain  History of Present Illness: 72 yo female with PMH of vertigo since age 61. She presents to the neurosurgery service for management of L3 pathologic fracture 2/2 metastatic disease.  She had an outpatient MRI for lower back pain and this revealed metastatic disease with epidural tumor.  She states her pain began insidiously in the middle of March.  Difficulty sleeping due to extreme pain, constipation but no urinary symptoms.  Feels weak in her legs, especially the left.  She denies any vision changes reports no pain elsewhere.  She has not had any of her cancer screenings for at least the last 20 years or more.  She has never had a colonoscopy.  She has no history of much sun exposure.  She has an extensive smoking history and continues to smoke 2 PPD for the last 25 years, has smoked since age 74.  Pt says she performs self breast exams and has never felt any lumps.   FH:  Lung cancer: father, grandfather, brother ?Ovarian vs Uterine cancer: sister at young age, Mother   Meds: Current Facility-Administered Medications  Medication Dose Route Frequency Provider Last Rate Last Dose  . 0.9 %  sodium chloride infusion  250 mL Intravenous Continuous Kary Kos, MD      . acetaminophen (TYLENOL) tablet 650 mg  650 mg Oral Q4H PRN Kary Kos, MD       Or  . acetaminophen (TYLENOL) suppository 650 mg  650 mg Rectal Q4H PRN Kary Kos, MD      . alum & mag hydroxide-simeth (MAALOX/MYLANTA) 200-200-20 MG/5ML suspension 30 mL  30 mL Oral Q6H PRN Kary Kos, MD      . cyclobenzaprine (FLEXERIL) tablet 10 mg  10 mg Oral TID PRN Kary Kos, MD      . dexamethasone (DECADRON) injection 10 mg  10 mg Intravenous Q6H Kary Kos, MD        . famotidine (PEPCID) IVPB 20 mg premix  20 mg Intravenous Q12H Kary Kos, MD      . feeding supplement (ENSURE ENLIVE) (ENSURE ENLIVE) liquid 237 mL  237 mL Oral BID BM Kary Kos, MD      . HYDROmorphone (DILAUDID) injection 1 mg  1 mg Intravenous Q2H PRN Kary Kos, MD      . meclizine (ANTIVERT) tablet 25 mg  25 mg Oral PRN Kary Kos, MD      . menthol-cetylpyridinium (CEPACOL) lozenge 3 mg  1 lozenge Oral PRN Kary Kos, MD       Or  . phenol (CHLORASEPTIC) mouth spray 1 spray  1 spray Mouth/Throat PRN Kary Kos, MD      . methocarbamol (ROBAXIN) tablet 500 mg  500 mg Oral BID Kary Kos, MD      . ondansetron [facility]) tablet 4 mg  4 mg Oral Q6H PRN Kary Kos, MD       Or  . ondansetron [facility]) injection 4 mg  4 mg Intravenous Q6H PRN Kary Kos, MD      . oxyCODONE (Oxy IR/ROXICODONE) immediate release tablet 15 mg  15 mg Oral Q3H PRN  Kary Kos, MD      . pantoprazole (PROTONIX) EC tablet 80 mg  80 mg Oral BID Kary Kos, MD      . sodium chloride flush (NS) 0.9 % injection 3 mL  3 mL Intravenous Q12H Kary Kos, MD      . sodium chloride flush (NS) 0.9 % injection 3 mL  3 mL Intravenous PRN Kary Kos, MD      . traMADol Veat[facility] Bourbon) tablet 50 mg  50 mg Oral Q6H PRN Kary Kos, MD        Allergies: Allergies as of 09/17/2017  . (No Known Allergies)   History reviewed. No pertinent past medical history. History reviewed. No pertinent surgical history. History reviewed. No pertinent family history. Social History   Socioeconomic History  . Marital status: Married    Spouse name: Not on file  . Number of children: Not on file  . Years of education: Not on file  . Highest education level: Not on file  Occupational History  . Not on file  Social Needs  . Financial resource strain: Not on file  . Food insecurity:    Worry: Not on file    Inability: Not on file  . Transportation needs:    Medical: Not on file    Non-medical: Not on file  Tobacco Use  . Smoking status:  Current Every Day Smoker  . Smokeless tobacco: Never Used  Substance and Sexual Activity  . Alcohol use: No  . Drug use: No  . Sexual activity: Not on file  Lifestyle  . Physical activity:    Days per week: Not on file    Minutes per session: Not on file  . Stress: Not on file  Relationships  . Social connections:    Talks on phone: Not on file    Gets together: Not on file    Attends religious service: Not on file    Active member of club or organization: Not on file    Attends meetings of clubs or organizations: Not on file    Relationship status: Not on file  . Intimate partner violence:    Fear of current or ex partner: Not on file    Emotionally abused: Not on file    Physically abused: Not on file    Forced sexual activity: Not on file  Other Topics Concern  . Not on file  Social History Narrative  . Not on file    Review of Systems: Pertinent items noted in HPI and remainder of comprehensive ROS otherwise negative.  Physical Exam: There were no vitals taken for this visit. Physical Exam  Constitutional: She is oriented to person, place, and time. No distress.  Cardiovascular: Normal rate and regular rhythm. Exam reveals no gallop and no friction rub.  Murmur (2/6 systolic murmur likely AS) heard. Pulmonary/Chest: Effort normal and breath sounds normal. No respiratory distress. She has no wheezes. She has no rales. She exhibits no tenderness.  Abdominal: Soft. Bowel sounds are normal. She exhibits no distension and no mass. There is no tenderness. There is no rebound and no guarding.  Musculoskeletal:       Lumbar back: She exhibits tenderness (exquisitely tender).  Neurological: She is alert and oriented to person, place, and time.  Normal sensation in bilateral LE.  Subtle decrease in strength of LLE flexion>extension compared with right   Skin: She is not diaphoretic.  Psychiatric: She has a normal mood and affect.    Lab results:  Imaging results:  No  results found.  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  She had an outpatient MRI for lower back pain and this revealed metastatic disease with epidural tumor.  Currently Lung cancer is leading differential given extensive smoking history and family history of lung cancer.  Second would be breast cancer given family history and lack of screening.  Third would be a gynecological cancer ? Of ovarian cancer family history.  Also in the differential at this age would be multiple myeloma. Never had a colon cancer screening but less likely to be colon cancer with mets to bones  -agree with CT chest with contrast as place to start will likely give [place] the answer, radiology does not perform mammograms in the hospital as a general rule but if no answer on CT will explore avenues to get this done as this is second on our differential.   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

## 2017-09-17 NOTE — H&P (Signed)
Julia Nguyen is an 72 y.o. female.   Chief Complaint:   L3 pathologic fracture consistent with metastatic disease HPI:  72 year old female presented to the office doing a partners with back pain ordered an outpatient MRI scan.  Outpatient MRI came back showing probable metastatic disease to L3 with epidural tumor.  Patient has had severe unrelenting back pain unable to take care of herself at home so came to the office today to go over the MRI and I have elected to admitted from the office.  She does have pain that radiates from her back down the backs of her legs occasionally into her calves and leg.  She has some numbness that travels down the front of her leg denies any numbness around her whole leg denies any saddle anesthesia.  She has good strength in her legs most of it has been pain limited.  Says bladder is been working fine she has been constipated but she did have a bowel movement last night.  No past medical history on file.  No past surgical history on file.  No family history on file. Social History:  reports that she has been smoking.  She has never used smokeless tobacco. She reports that she does not drink alcohol or use drugs.  Allergies: No Known Allergies  Medications Prior to Admission  Medication Sig Dispense Refill  . cephALEXin (KEFLEX) 500 MG capsule Take 1 capsule (500 mg total) by mouth 4 (four) times daily. 20 capsule 0  . lidocaine (LIDODERM) 5 % Place 1 patch onto the skin daily. Remove & Discard patch within 12 hours or as directed by MD 30 patch 0  . Meclizine HCl (BONINE) 25 MG CHEW Chew 1 tablet by mouth as needed.    . methocarbamol (ROBAXIN) 500 MG tablet Take 1 tablet (500 mg total) by mouth 2 (two) times daily. 20 tablet 0  . methocarbamol (ROBAXIN) 500 MG tablet Take 1 tablet (500 mg total) by mouth every 8 (eight) hours as needed for muscle spasms. 20 tablet 0  . naproxen sodium (ALEVE) 220 MG tablet Take 440 mg by mouth.    . traMADol (ULTRAM) 50 MG  tablet Take 1 tablet (50 mg total) by mouth every 6 (six) hours as needed. (Patient not taking: Reported on 09/05/2017) 10 tablet 0    No results found for this or any previous visit (from the past 48 hour(s)). Mr Lumbar Spine Wo Contrast  Result Date: 09/15/2017 CLINICAL DATA:  Low back pain for 5 weeks, anterior leg pain. EXAM: MRI LUMBAR SPINE WITHOUT CONTRAST TECHNIQUE: Multiplanar, multisequence MR imaging of the lumbar spine was performed. No intravenous contrast was administered. COMPARISON:  Lumbar spine radiographs September 09, 2017 FINDINGS: SEGMENTATION: For the purposes of this report, the last well-formed intervertebral disc is reported as L5-S1. ALIGNMENT: Maintained lumbar lordosis. Minimal grade 1 L1-2 retrolisthesis. No spondylolysis. VERTEBRAE:Moderate L3 fracture approximate 50% central height loss. Diffusely low T1 signal L3 vertebral body extending into bilateral pedicles and LEFT transverse process. Similarly decreased signal 11 mm lesion inferior endplate of L2, 11 mm lesion superior endplate of L4 and 14 mm lesion RIGHT iliac bone. Mild T11-12, mild L4-5 and moderate L5-S1 disc height loss with desiccation and proportional chronic discogenic endplate changes. L2-3 and L3-4 disc edema. CONUS MEDULLARIS AND CAUDA EQUINA: Conus medullaris terminates at L2 and demonstrates normal morphology and signal characteristics. Cauda equina is normal. PARASPINAL AND OTHER SOFT TISSUES: Nonacute. Abnormal low T1, intermediate T2 11 mm mass LEFT paraspinal muscles at  L3, 8 mm mass LEFT paraspinal muscles at L1. DISC LEVELS: T12-L1: No disc bulge, canal stenosis nor neural foraminal narrowing. L1-2: Retrolisthesis. Small broad-based disc bulge. No canal stenosis or neural foraminal narrowing. L2-3: Moderate broad-based disc bulge. Tumoral extension through L3 vertebral body into ventral dural space measuring to 9 mm in AP dimension. Moderate canal stenosis at L3 with lateral recess effacement likely  affecting the traversing LEFT greater than RIGHT L3 nerves. No neural foraminal narrowing. L3-4: Small broad-based disc bulge. Tumoral expansion through LEFT vertebral body. Mild canal stenosis with narrowed LEFT lateral recess likely affecting the traversing LEFT L4 nerve. Moderate bilateral neural foraminal narrowing. Exited LEFT L3 nerve impingement. L4-5: Moderate broad-based disc bulge asymmetric to LEFT. Mild facet arthropathy and ligamentum flavum redundancy. Mild canal stenosis. LEFT annular fissure. Mild RIGHT, mild to moderate LEFT neural foraminal narrowing. L5-S1: Moderate broad-based disc bulge asymmetric to LEFT versus LEFT extraforaminal disc protrusion contacting the exited LEFT L5 nerve. Moderate to severe facet arthropathy without canal stenosis. Severe LEFT neural foraminal narrowing. IMPRESSION: 1. 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 2. Moderate L3 pathologic fracture. Tumoral extends into epidural space resulting in moderate canal stenosis. 3. Mild canal stenosis L3-4 and L4-5. Neural foraminal narrowing L3-4 through L5-S1: Severe on the LEFT at L5-S1. 4. These results will be called to the ordering clinician or representative by the Radiologist Assistant, and communication documented in the PACS or zVision Dashboard. Electronically Signed   By: Elon Alas M.D.   On: 09/15/2017 19:26    Review of Systems  Musculoskeletal: Positive for back pain and joint pain.  Neurological: Positive for tingling and sensory change.    There were no vitals taken for this visit. Physical Exam  Constitutional: She is oriented to person, place, and time. She appears well-developed and well-nourished.  HENT:  Head: Normocephalic.  Eyes: Pupils are equal, round, and reactive to light.  Neck: Normal range of motion.  Cardiovascular: Normal rate.  Respiratory: Effort normal and breath sounds normal.  GI: Soft. Bowel sounds are normal.  Musculoskeletal: Normal range of  motion.  Neurological: She is alert and oriented to person, place, and time. She has normal strength. GCS eye subscore is 4. GCS verbal subscore is 5. GCS motor subscore is 6.  Patient is awake and alert strength 5/5 iliopsoas, quads, hamstrings, gastrocs, and tibialis, and EHL.  Skin: Skin is warm and dry.     Assessment/Plan   73 year old female with new diagnosis of probable metastatic cancer to L3.  I have admitted the patient will obtain CT scans chest abdomen pelvis will order UPEP/SPEP and consult Medicine for further workup  Kimo Bancroft P, MD 09/17/2017, 3:57 PM

## 2017-09-18 ENCOUNTER — Encounter (HOSPITAL_COMMUNITY): Payer: Self-pay | Admitting: Student

## 2017-09-18 ENCOUNTER — Ambulatory Visit
Admit: 2017-09-18 | Discharge: 2017-09-18 | Disposition: A | Discharge status: Home / Self Care | Payer: Medicare Other | Source: Ambulatory Visit | Attending: Radiation Oncology | Admitting: Radiation Oncology

## 2017-09-18 DIAGNOSIS — N2889 Other specified disorders of kidney and ureter: Secondary | ICD-10-CM

## 2017-09-18 DIAGNOSIS — C729 Malignant neoplasm of central nervous system, unspecified: Secondary | ICD-10-CM

## 2017-09-18 DIAGNOSIS — M8458XA Pathological fracture in neoplastic disease, other specified site, initial encounter for fracture: Principal | ICD-10-CM

## 2017-09-18 DIAGNOSIS — L8990 Pressure ulcer of unspecified site, unspecified stage: Secondary | ICD-10-CM

## 2017-09-18 DIAGNOSIS — C3491 Malignant neoplasm of unspecified part of right bronchus or lung: Secondary | ICD-10-CM | POA: Insufficient documentation

## 2017-09-18 DIAGNOSIS — R42 Dizziness and giddiness: Secondary | ICD-10-CM

## 2017-09-18 DIAGNOSIS — C7949 Secondary malignant neoplasm of other parts of nervous system: Secondary | ICD-10-CM

## 2017-09-18 DIAGNOSIS — K5900 Constipation, unspecified: Secondary | ICD-10-CM

## 2017-09-18 DIAGNOSIS — S32001A Stable burst fracture of unspecified lumbar vertebra, initial encounter for closed fracture: Secondary | ICD-10-CM

## 2017-09-18 DIAGNOSIS — R011 Cardiac murmur, unspecified: Secondary | ICD-10-CM

## 2017-09-18 DIAGNOSIS — R590 Localized enlarged lymph nodes: Secondary | ICD-10-CM

## 2017-09-18 DIAGNOSIS — Z801 Family history of malignant neoplasm of trachea, bronchus and lung: Secondary | ICD-10-CM

## 2017-09-18 DIAGNOSIS — R911 Solitary pulmonary nodule: Secondary | ICD-10-CM

## 2017-09-18 DIAGNOSIS — K6389 Other specified diseases of intestine: Secondary | ICD-10-CM

## 2017-09-18 DIAGNOSIS — F17210 Nicotine dependence, cigarettes, uncomplicated: Secondary | ICD-10-CM

## 2017-09-18 LAB — PROTEIN ELECTROPHORESIS, SERUM
A/G RATIO SPE: 1 (ref 0.7–1.7)
ALBUMIN ELP: 3 g/dL (ref 2.9–4.4)
ALPHA-2-GLOBULIN: 1 g/dL (ref 0.4–1.0)
Alpha-1-Globulin: 0.4 g/dL (ref 0.0–0.4)
Beta Globulin: 0.9 g/dL (ref 0.7–1.3)
GLOBULIN, TOTAL: 3 g/dL (ref 2.2–3.9)
Gamma Globulin: 0.8 g/dL (ref 0.4–1.8)
Total Protein ELP: 6 g/dL (ref 6.0–8.5)

## 2017-09-18 LAB — PROTIME-INR
INR: 0.93
PROTHROMBIN TIME: 12.4 s (ref 11.4–15.2)

## 2017-09-18 NOTE — Consult Note (Signed)
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 female  Hx ongoing back pain x2-3 months. Admitted to [facility] 09/17/2017 for L3 pathologic fracture consistent with metastatic disease.  CT abdomen/pelvis/chest 09/17/2017: 1. Multiple bilateral pulmonary nodules are associated with necrotic and relatively bulky mediastinal and right hilar lymphadenopathy. These imaging features are consistent with metastatic disease in a patient with recently demonstrated osseous metastatic involvement. 2. 10 mm spiculated nodule in the peripheral right upper lobe is the dominant lung nodule on today's study. This could represent a primary malignancy and account for the metastatic disease in the lungs and mediastinum, but please see below. 3. 2.6 cm irregular peripherally enhancing lesion in the cecum, highly concerning for primary colorectal neoplasm. Small para cecal lymph nodes and borderline enlarged ileocolic lymph node are concerning for metastatic disease. 4. 2 cm lesion in the lower pole of the left kidney appears to have irregular peripheral low level enhancement. A similar but smaller 12 mm lesion is identified in the upper pole the right kidney. These could potentially represent complex cysts, apparent peripheral enhancement raises concern for neoplasm. While metastatic disease to the kidneys is not common, the bilateral involvement raises this question. Bilateral primary renal neoplasm also consideration. Dedicated abdominal MRI without and with contrast would likely prove helpful to further evaluate. 5. Low-density in the medial segment left liver adjacent to the falciform ligament is in a characteristic location for focal fatty deposition but the appearance is more focal and rounded than typically seen. This could  also be further assessed at the time of MRI. Small hypervascular lesion in the posterior right liver likely vascular malformation, but could also be further assessed at MRI. 6. Probable tiny bilateral adrenal nodules. 7.  Aortic Atherosclerois (ICD10-170.0) 8.  Emphysema. ([id number])  MRI lumbar spine 09/15/2017: 1. 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 2. Moderate L3 pathologic fracture. Tumoral extends into epidural space resulting in moderate canal stenosis. 3. Mild canal stenosis L3-4 and L4-5. Neural foraminal narrowing L3-4 through L5-S1: Severe on the LEFT at L5-S1. 4. These results will be called to the ordering clinician or representative by the Radiologist Assistant, and communication documented in the PACS or zVision Dashboard.  IR consulted by Dr. Saintclair Halsted for image-guided lumbar spinal tumor biopsy. Patient alert and awake laying in bed. Complains of back pain which has improved with medications. Denies fever, headache, chest pain, or shortness of breath.  History reviewed. No pertinent past medical history.  History reviewed. No pertinent surgical history.  Allergies: Patient has no known allergies.  Medications: Prior to Admission medications   Medication Sig Start Date End Date Taking? Authorizing Provider  diclofenac (VOLTAREN) 75 MG EC tablet Take 75 mg by mouth 2 (two) times daily.   Yes [provider]  lidocaine (LIDODERM) 5 % Place 1 patch onto the skin daily. Remove & Discard patch within 12 hours or as directed by MD 09/05/17  Yes Blanchie Dessert, MD  Meclizine HCl (BONINE) 25 MG CHEW Chew 1 tablet by mouth as needed (dizziness).    Yes [provider]  naproxen sodium (ALEVE) 220 MG tablet Take 440 mg by mouth daily as needed (pain).    Yes [provider]  cephALEXin (KEFLEX) 500 MG capsule Take 1 capsule (500 mg total) by mouth 4 (four) times daily. Patient not taking:  Reported on 09/18/2017 09/05/17   Blanchie Dessert, MD  methocarbamol (ROBAXIN) 500 MG tablet Take 1 tablet (500 mg total) by mouth every 8 (eight) hours as needed for muscle spasms. 09/05/17   Blanchie Dessert, MD  omeprazole (PRILOSEC) 40 MG capsule Take 40 mg by mouth daily.    [provider]  traMADol (ULTRAM) 50 MG tablet Take 1 tablet (50 mg total) by mouth every 6 (six) hours as needed. Patient not taking: Reported on 09/05/2017 08/19/17   Okey Regal, PA-C     History reviewed. No pertinent family history.  Social History   Socioeconomic History  . Marital status: Married    Spouse name: Not on file  . Number of children: Not on file  . Years of education: Not on file  . Highest education level: Not on file  Occupational History  . Not on file  Social Needs  . Financial resource strain: Not on file  . Food insecurity:    Worry: Not on file    Inability: Not on file  . Transportation needs:    Medical: Not on file    Non-medical: Not on file  Tobacco Use  . Smoking status: Current Every Day Smoker  . Smokeless tobacco: Never Used  Substance and Sexual Activity  . Alcohol use: No  . Drug use: No  . Sexual activity: Not on file  Lifestyle  . Physical activity:    Days per week: Not on file    Minutes per session: Not on file  . Stress: Not on file  Relationships  . Social connections:    Talks on phone: Not on file    Gets together: Not on file    Attends religious service: Not on file    Active member of club or organization: Not on file    Attends meetings of clubs or organizations: Not on file    Relationship status: Not on file  Other Topics Concern  . Not on file  Social History Narrative  . Not on file     Review of Systems: A 12 point ROS discussed and pertinent positives are indicated in the HPI above.  All other systems are negative.  Review of Systems  Constitutional: Negative for chills and fever.  Respiratory: Negative for shortness of breath and wheezing.     Cardiovascular: Negative for chest pain and palpitations.  Musculoskeletal: Positive for back pain.  Neurological: Negative for headaches.  Psychiatric/Behavioral: Negative for behavioral problems and confusion.    Vital Signs: BP (!) 149/69   Pulse 64   Temp 97.8 F (36.6 C) (Oral)   Resp 15   SpO2 96%   Physical Exam   MD Evaluation Airway: WNL Heart: WNL Abdomen: WNL Chest/ Lungs: WNL ASA  Classification: 3 Mallampati/Airway Score: One   Imaging: Ct Chest W Contrast  Result Date: 09/18/2017 CLINICAL DATA:  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 Evaluate for primary malignancy. EXAM: CT CHEST, ABDOMEN, AND PELVIS WITH CONTRAST TECHNIQUE: Multidetector CT imaging of the chest, abdomen and pelvis was performed following the standard protocol during bolus administration of intravenous contrast. CONTRAST:  121mL ISOVUE-300 IOPAMIDOL (ISOVUE-300) INJECTION 61% COMPARISON:  Lumbar spine MRI 09/15/2017 FINDINGS: CT CHEST FINDINGS Cardiovascular: The heart size is normal. No pericardial effusion. Coronary artery calcification is evident. Atherosclerotic calcification is noted in the wall of the thoracic aorta. Mediastinum/Nodes: 4.2 x 2.9 cm necrotic nodal mass identified in the precarinal station. 13 mm short axis subcarinal lymph  node evident. Necrotic right hilar lymphadenopathy measures 2 cm in short axis. 9 mm short axis right hilar lymph node evident. Tiny hiatal hernia. The esophagus has normal imaging features. There is no axillary lymphadenopathy. Multiple thyroid nodules are evident bilaterally. Lungs/Pleura: Moderate changes of emphysema noted with upper lobe predominance. 10 x 9 mm spiculated nodule identified in the right upper lobe (image 29/series 4). Additional (approximately 20) smaller nodules are identified in the lungs bilaterally, ranging in size from several mm up to about 5 mm. No focal airspace consolidation. No pleural  effusion. Musculoskeletal: No lytic or sclerotic osseous abnormality identified in the thorax. CT ABDOMEN PELVIS FINDINGS Hepatobiliary: 10 mm focal area of low attenuation is identified in the medial segment left liver along the falciform ligament. This is in a characteristic location for focal fatty deposition, but more rounded and focal than typically seen in the setting of focal fat. 16 mm hypervascular lesion in the posterior right liver (61/3) is associated with apparent early filling of a right hepatic vein branch. There is no evidence for gallstones, gallbladder wall thickening, or pericholecystic fluid. No intrahepatic or extrahepatic biliary dilation. Pancreas: No focal mass lesion. No dilatation of the main duct. No intraparenchymal cyst. No peripancreatic edema. Spleen: No splenomegaly. No focal mass lesion. Adrenals/Urinary Tract: Thickening of the adrenal glands noted bilaterally with potential tiny bilateral adrenal nodules measuring in the 7-8 mm size range. 12 mm lesion upper pole right kidney (6/8) has attenuation too high to be a simple cyst and has a suggestion of enhancement. 2 cm lesion in the lower pole the left kidney (70/3) has irregular margins and apparent irregular peripheral low level enhancement (see image 21/delayed series 8). No evidence for hydroureter. The urinary bladder appears normal for the degree of distention. Stomach/Bowel: Tiny hiatal hernia. Stomach otherwise unremarkable. Duodenum is normally positioned as is the ligament of Treitz. No small bowel wall thickening. No small bowel dilatation. The terminal ileum is normal. The appendix is normal. Cecal tip is directed cranially in the right lower quadrant with the tip of the cecum up under the right liver. 2.0 x 2.6 x 2.2 cm rim enhancing mass is identified in the wall of the cecum proximal to the ileocecal valve (although positioned cranially), see image 79/3 and image 43/coronal series 6). Diverticular changes are noted in  the left colon without evidence of diverticulitis. Vascular/Lymphatic: There is abdominal aortic atherosclerosis without aneurysm. No gastrohepatic ligament lymphadenopathy. 9 mm short axis lymph node in the hepatoduodenal ligament is upper normal. No para-aortic lymphadenopathy. No pelvic sidewall lymphadenopathy. Lymph nodes are identified adjacent to the cecum (image 81/series 3 and there is an 8 mm short axis lymph node in the ileocolic mesentery (93/2) worrisome for metastatic disease. Reproductive: Uterus unremarkable. 2.4 cm benign appearing cystic lesion identified in the left ovary. Right ovary unremarkable. Other: No intraperitoneal free fluid. Musculoskeletal: Left groin hernia contains only fat. Compression fracture at L3 again noted. Epidural and paraspinal tumor was better demonstrated on the previous lumbar spine MRI. IMPRESSION: 1. Multiple bilateral pulmonary nodules are associated with necrotic and relatively bulky mediastinal and right hilar lymphadenopathy. These imaging features are consistent with metastatic disease in a patient with recently demonstrated osseous metastatic involvement. 2. 10 mm spiculated nodule in the peripheral right upper lobe is the dominant lung nodule on today's study. This could represent a primary malignancy and account for the metastatic disease in the lungs and mediastinum, but please see below. 3. 2.6 cm irregular peripherally enhancing lesion in the  cecum, highly concerning for primary colorectal neoplasm. Small para cecal lymph nodes and borderline enlarged ileocolic lymph node are concerning for metastatic disease. 4. 2 cm lesion in the lower pole of the left kidney appears to have irregular peripheral low level enhancement. A similar but smaller 12 mm lesion is identified in the upper pole the right kidney. These could potentially represent complex cysts, apparent peripheral enhancement raises concern for neoplasm. While metastatic disease to the kidneys is not  common, the bilateral involvement raises this question. Bilateral primary renal neoplasm also consideration. Dedicated abdominal MRI without and with contrast would likely prove helpful to further evaluate. 5. Low-density in the medial segment left liver adjacent to the falciform ligament is in a characteristic location for focal fatty deposition but the appearance is more focal and rounded than typically seen. This could also be further assessed at the time of MRI. Small hypervascular lesion in the posterior right liver likely vascular malformation, but could also be further assessed at MRI. 6. Probable tiny bilateral adrenal nodules. 7.  Aortic Atherosclerois (ICD10-170.0) 8.  Emphysema. ([id number]) The findings of this exam were discussed with the Internal Medicine clinical team at the time of study interpretation. Electronically Signed   By: Misty Stanley M.D.   On: 09/18/2017 10:50   Mr Lumbar Spine Wo Contrast  Result Date: 09/15/2017 CLINICAL DATA:  Low back pain for 5 weeks, anterior leg pain. EXAM: MRI LUMBAR SPINE WITHOUT CONTRAST TECHNIQUE: Multiplanar, multisequence MR imaging of the lumbar spine was performed. No intravenous contrast was administered. COMPARISON:  Lumbar spine radiographs September 09, 2017 FINDINGS: SEGMENTATION: For the purposes of this report, the last well-formed intervertebral disc is reported as L5-S1. ALIGNMENT: Maintained lumbar lordosis. Minimal grade 1 L1-2 retrolisthesis. No spondylolysis. VERTEBRAE:Moderate L3 fracture approximate 50% central height loss. Diffusely low T1 signal L3 vertebral body extending into bilateral pedicles and LEFT transverse process. Similarly decreased signal 11 mm lesion inferior endplate of L2, 11 mm lesion superior endplate of L4 and 14 mm lesion RIGHT iliac bone. Mild T11-12, mild L4-5 and moderate L5-S1 disc height loss with desiccation and proportional chronic discogenic endplate changes. L2-3 and L3-4 disc edema. CONUS MEDULLARIS AND CAUDA  EQUINA: Conus medullaris terminates at L2 and demonstrates normal morphology and signal characteristics. Cauda equina is normal. PARASPINAL AND OTHER SOFT TISSUES: Nonacute. Abnormal low T1, intermediate T2 11 mm mass LEFT paraspinal muscles at L3, 8 mm mass LEFT paraspinal muscles at L1. DISC LEVELS: T12-L1: No disc bulge, canal stenosis nor neural foraminal narrowing. L1-2: Retrolisthesis. Small broad-based disc bulge. No canal stenosis or neural foraminal narrowing. L2-3: Moderate broad-based disc bulge. Tumoral extension through L3 vertebral body into ventral dural space measuring to 9 mm in AP dimension. Moderate canal stenosis at L3 with lateral recess effacement likely affecting the traversing LEFT greater than RIGHT L3 nerves. No neural foraminal narrowing. L3-4: Small broad-based disc bulge. Tumoral expansion through LEFT vertebral body. Mild canal stenosis with narrowed LEFT lateral recess likely affecting the traversing LEFT L4 nerve. Moderate bilateral neural foraminal narrowing. Exited LEFT L3 nerve impingement. L4-5: Moderate broad-based disc bulge asymmetric to LEFT. Mild facet arthropathy and ligamentum flavum redundancy. Mild canal stenosis. LEFT annular fissure. Mild RIGHT, mild to moderate LEFT neural foraminal narrowing. L5-S1: Moderate broad-based disc bulge asymmetric to LEFT versus LEFT extraforaminal disc protrusion contacting the exited LEFT L5 nerve. Moderate to severe facet arthropathy without canal stenosis. Severe LEFT neural foraminal narrowing. IMPRESSION: 1. 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 2. Moderate L3 pathologic  fracture. Tumoral extends into epidural space resulting in moderate canal stenosis. 3. Mild canal stenosis L3-4 and L4-5. Neural foraminal narrowing L3-4 through L5-S1: Severe on the LEFT at L5-S1. 4. These results will be called to the ordering clinician or representative by the Radiologist Assistant, and communication documented in the PACS or  zVision Dashboard. Electronically Signed   By: Elon Alas M.D.   On: 09/15/2017 19:26   Ct Abdomen Pelvis W Contrast  Result Date: 09/18/2017 CLINICAL DATA:  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 Evaluate for primary malignancy. EXAM: CT CHEST, ABDOMEN, AND PELVIS WITH CONTRAST TECHNIQUE: Multidetector CT imaging of the chest, abdomen and pelvis was performed following the standard protocol during bolus administration of intravenous contrast. CONTRAST:  153mL ISOVUE-300 IOPAMIDOL (ISOVUE-300) INJECTION 61% COMPARISON:  Lumbar spine MRI 09/15/2017 FINDINGS: CT CHEST FINDINGS Cardiovascular: The heart size is normal. No pericardial effusion. Coronary artery calcification is evident. Atherosclerotic calcification is noted in the wall of the thoracic aorta. Mediastinum/Nodes: 4.2 x 2.9 cm necrotic nodal mass identified in the precarinal station. 13 mm short axis subcarinal lymph node evident. Necrotic right hilar lymphadenopathy measures 2 cm in short axis. 9 mm short axis right hilar lymph node evident. Tiny hiatal hernia. The esophagus has normal imaging features. There is no axillary lymphadenopathy. Multiple thyroid nodules are evident bilaterally. Lungs/Pleura: Moderate changes of emphysema noted with upper lobe predominance. 10 x 9 mm spiculated nodule identified in the right upper lobe (image 29/series 4). Additional (approximately 20) smaller nodules are identified in the lungs bilaterally, ranging in size from several mm up to about 5 mm. No focal airspace consolidation. No pleural effusion. Musculoskeletal: No lytic or sclerotic osseous abnormality identified in the thorax. CT ABDOMEN PELVIS FINDINGS Hepatobiliary: 10 mm focal area of low attenuation is identified in the medial segment left liver along the falciform ligament. This is in a characteristic location for focal fatty deposition, but more rounded and focal than typically seen in the setting of focal  fat. 16 mm hypervascular lesion in the posterior right liver (61/3) is associated with apparent early filling of a right hepatic vein branch. There is no evidence for gallstones, gallbladder wall thickening, or pericholecystic fluid. No intrahepatic or extrahepatic biliary dilation. Pancreas: No focal mass lesion. No dilatation of the main duct. No intraparenchymal cyst. No peripancreatic edema. Spleen: No splenomegaly. No focal mass lesion. Adrenals/Urinary Tract: Thickening of the adrenal glands noted bilaterally with potential tiny bilateral adrenal nodules measuring in the 7-8 mm size range. 12 mm lesion upper pole right kidney (6/8) has attenuation too high to be a simple cyst and has a suggestion of enhancement. 2 cm lesion in the lower pole the left kidney (70/3) has irregular margins and apparent irregular peripheral low level enhancement (see image 21/delayed series 8). No evidence for hydroureter. The urinary bladder appears normal for the degree of distention. Stomach/Bowel: Tiny hiatal hernia. Stomach otherwise unremarkable. Duodenum is normally positioned as is the ligament of Treitz. No small bowel wall thickening. No small bowel dilatation. The terminal ileum is normal. The appendix is normal. Cecal tip is directed cranially in the right lower quadrant with the tip of the cecum up under the right liver. 2.0 x 2.6 x 2.2 cm rim enhancing mass is identified in the wall of the cecum proximal to the ileocecal valve (although positioned cranially), see image 79/3 and image 43/coronal series 6). Diverticular changes are noted in the left colon without evidence of diverticulitis. Vascular/Lymphatic: There is  abdominal aortic atherosclerosis without aneurysm. No gastrohepatic ligament lymphadenopathy. 9 mm short axis lymph node in the hepatoduodenal ligament is upper normal. No para-aortic lymphadenopathy. No pelvic sidewall lymphadenopathy. Lymph nodes are identified adjacent to the cecum (image 81/series 3  and there is an 8 mm short axis lymph node in the ileocolic mesentery (58/8) worrisome for metastatic disease. Reproductive: Uterus unremarkable. 2.4 cm benign appearing cystic lesion identified in the left ovary. Right ovary unremarkable. Other: No intraperitoneal free fluid. Musculoskeletal: Left groin hernia contains only fat. Compression fracture at L3 again noted. Epidural and paraspinal tumor was better demonstrated on the previous lumbar spine MRI. IMPRESSION: 1. Multiple bilateral pulmonary nodules are associated with necrotic and relatively bulky mediastinal and right hilar lymphadenopathy. These imaging features are consistent with metastatic disease in a patient with recently demonstrated osseous metastatic involvement. 2. 10 mm spiculated nodule in the peripheral right upper lobe is the dominant lung nodule on today's study. This could represent a primary malignancy and account for the metastatic disease in the lungs and mediastinum, but please see below. 3. 2.6 cm irregular peripherally enhancing lesion in the cecum, highly concerning for primary colorectal neoplasm. Small para cecal lymph nodes and borderline enlarged ileocolic lymph node are concerning for metastatic disease. 4. 2 cm lesion in the lower pole of the left kidney appears to have irregular peripheral low level enhancement. A similar but smaller 12 mm lesion is identified in the upper pole the right kidney. These could potentially represent complex cysts, apparent peripheral enhancement raises concern for neoplasm. While metastatic disease to the kidneys is not common, the bilateral involvement raises this question. Bilateral primary renal neoplasm also consideration. Dedicated abdominal MRI without and with contrast would likely prove helpful to further evaluate. 5. Low-density in the medial segment left liver adjacent to the falciform ligament is in a characteristic location for focal fatty deposition but the appearance is more focal and  rounded than typically seen. This could also be further assessed at the time of MRI. Small hypervascular lesion in the posterior right liver likely vascular malformation, but could also be further assessed at MRI. 6. Probable tiny bilateral adrenal nodules. 7.  Aortic Atherosclerois (ICD10-170.0) 8.  Emphysema. ([id number]) The findings of this exam were discussed with the Internal Medicine clinical team at the time of study interpretation. Electronically Signed   By: Misty Stanley M.D.   On: 09/18/2017 10:50    Labs:  CBC: Recent Labs    09/17/17 1928  WBC 14.3*  HGB 15.8*  HCT 46.4*  PLT 257    COAGS: No results for input(s): INR, APTT in the last 8760 hours.  BMP: Recent Labs    09/17/17 1928  NA 136  K 4.2  CL 102  CO2 24  GLUCOSE 140*  BUN 16  CALCIUM 9.3  CREATININE 0.76  GFRNONAA >60  GFRAA >60    LIVER FUNCTION TESTS: Recent Labs    09/17/17 1928  BILITOT 0.4  AST 19  ALT 22  ALKPHOS 71  PROT 6.0*  ALBUMIN 3.1*    TUMOR MARKERS: No results for input(s): AFPTM, CEA, CA199, CHROMGRNA in the last 8760 hours.  Assessment and Plan:  Lumbar spinal tumor. Plan for image-guided lumbar spinal tumor biopsy tomorrow with Dr. Vernard Gambles. Patient at this afternoon- she will be NPO at midnight. She does not take blood thinners. INR pending.  Risks and benefits discussed with the patient including, but not limited to bleeding, infection, damage to adjacent structures or low yield requiring additional  tests. All of the patient's questions were answered, patient is agreeable to proceed. Consent signed and in chart.  Thank you for this interesting consult.  I greatly enjoyed meeting Julia Nguyen and look forward to participating in their care.  A copy of this report was sent to the requesting provider on this date.  Electronically Signed: Earley Abide, PA-C 09/18/2017, 2:10 PM   I spent a total of 30 minutes in face to face in clinical consultation, greater  than 50% of which was counseling/coordinating care for lumbar spinal tumor.

## 2017-09-18 NOTE — Progress Notes (Signed)
Orthopedic Tech Progress Note Patient Details:  Julia Nguyen [birth year] [id number] Brace completed by [employer]. Patient ID: Julia Nguyen, female   DOB: 02-25-1946, 72 y.o.   MRN: [id number]   Braulio Bosch 09/18/2017, 6:09 PM

## 2017-09-18 NOTE — Progress Notes (Signed)
Inpatient Rehabilitation  Per PT, OT recommendations patient was screened by Gunnar Fusi for appropriateness for an Inpatient Acute Rehab consult.  At this time we are recommending an Inpatient Rehab consult.  Please order if you are agreeable.    Carmelia Roller., CCC/SLP Admission Coordinator  [facility]  Cell [phone]

## 2017-09-18 NOTE — Progress Notes (Signed)
Subjective: Patient reports Patient reports pain is some better still difficult for patient aligned and positioned prone  Objective: Vital signs in last 24 hours: Temp:  [97.8 F (36.6 C)-99.1 F (37.3 C)] 97.8 F (36.6 C) (04/24 0753) Pulse Rate:  [64-88] 65 (04/24 0753) Resp:  [12-22] 15 (04/24 0753) BP: (132-161)/(61-83) 149/69 (04/24 0753) SpO2:  [92 %-99 %] 96 % (04/24 0753)  Intake/Output from previous day: 04/23 0701 - 04/24 0700 In: 170 [Nguyen.O.:120; IV Piggyback:50] Out: 1 [Urine:1] Intake/Output this shift: No intake/output data recorded.  Neurologically stable no changes in strength or legs do think it somewhat pain limited but isolate muscle testing was 5 out of 5 yesterday and stable this morning  Lab Results: Recent Labs    09/17/17 1928  WBC 14.3*  HGB 15.8*  HCT 46.4*  PLT 257   BMET Recent Labs    09/17/17 1928  NA 136  K 4.2  CL 102  CO2 24  GLUCOSE 140*  BUN 16  CREATININE 0.76  CALCIUM 9.3    Studies/Results: No results found.  Assessment/Plan: Continue metastatic workup consider CT-guided aspiration of vertebral body if no other lesions picked up. CT scans of chest abdomen pelvis have been performed they have not been officially read yet however they do suggest possibility of a right-sided lung cancer.  LOS: 1 day     Julia Nguyen 09/18/2017, 10:12 AM

## 2017-09-18 NOTE — Progress Notes (Signed)
Orthopedic Tech Progress Note Patient Details:  Quaniya Damas Quarry [birth year] [id number]  Patient ID: Milas Gain Stonesifer, female   DOB: 12/09/1945, 72 y.o.   MRN: [id number]   Hildred Priest 09/18/2017, 9:25 AM Called in bio-tech brace order; spoke with Bella Kennedy

## 2017-09-18 NOTE — Evaluation (Signed)
Occupational Therapy Evaluation Patient Details Name: Julia Nguyen MRN: [id number] DOB: 1945-06-17 Today's Date: 09/18/2017    History of Present Illness pt is a 72 y/o female with no significant medical hx except vertigo, admitted under neruosurgery serv[facility] for management of L3 pathological fracture due to metastatic disease, MRI showing epidural tumor.  Work up to determine primary site for CA pending.   Clinical Impression   Patient is s/p L 3 pathological fx surgery resulting in functional limitations due to the deficits listed below (see OT problem list). Pt currently requires prone position for all tasks due to least amount of pain. Pt positions herself in a plank like position for long periods of time and requires dressing on her elbows to prevent skin break down. Pt currently with bladder and bowel changes with this course of events. Pt requires extensive (A) for Adls at this time. Pt very engaged and attempting all task asked of her during eval. Pt does achieve upright position in standing only.   Patient will benefit from skilled OT acutely to increase independence and safety with ADLS to allow discharge CIR.     Follow Up Recommendations  CIR    Equipment Recommendations  3 in 1 bedside commode    Recommendations for Other Services Rehab consult(pastoral care)     Precautions / Restrictions Precautions Precautions: Back Required Braces or Orthoses: Spinal Brace Spinal Brace: Lumbar corset;Applied in supine position Restrictions Weight Bearing Restrictions: No      Mobility Bed Mobility Overal bed mobility: Needs Assistance Bed Mobility: Rolling Rolling: Modified independent (Device/Increase time)         General bed mobility comments: Pt supine on arrival. pt exiting the bed this session in prone position. Pt complete the transfer this same method with PT per PT KEN nad patient. Pt with brace don and sliding feet off teh bed in prone. OT elevating the bed slowly so  that patient can slide feet onto the floor and walk her UB backward on hands to a full upright position. This method avoids hip flexion position for patient. Pt able to static standing with BIL UE on bed surface and bed elevated above patients waist height  Transfers Overall transfer level: Needs assistance Equipment used: Rolling walker (2 wheeled) Transfers: Sit to/from Stand Sit to Stand: Min assist         General transfer comment: pt requires bil LE block a patients heels to prevent patient from sliding as the bed was eelvated. pt completed entire transfer posterior in prone    Balance Overall balance assessment: Needs assistance     Sitting balance - Comments: unable attempt at this time   Standing balance support: Bilateral upper extremity supported Standing balance-Leahy Scale: Poor Standing balance comment: heavy reliance on bil Ue                           ADL either performed or assessed with clinical judgement   ADL Overall ADL's : Needs assistance/impaired Eating/Feeding: Minimal assistance Eating/Feeding Details (indicate cue type and reason): pt is eating laying on her side in the bed because she not sit in a chair or lay on her back at this tim edue to spasms      Upper Body Bathing: Moderate assistance   Lower Body Bathing: Maximal assistance   Upper Body Dressing : Moderate assistance Upper Body Dressing Details (indicate cue type and reason): pt is in a prone position on arrival. pt pushing herself  up on elbos in a "plank" position to have the brace slid under her and placed.  Lower Body Dressing: Maximal assistance     Toilet Transfer Details (indicate cue type and reason): pt currently with brief and purewick in place. pt reports having incontience of bladder/ bowel with the onset of this current medical dx. pt reports "before this i went to the toilet like everyeone else. I dont want to use these diaper. I am not choosing to pee myself it just  comes out and i can't help it" Pt also reports inability to tolerate sitting on commode prior to surgery due to back pain           General ADL Comments: pt reports pain in bil thighs with static standing     Vision         Perception     Praxis      Pertinent Vitals/Pain Pain Assessment: 0-10 Pain Score: 3  Pain Location: back in standing position Pain Descriptors / Indicators: Sore Pain Intervention(s): Monitored during session;Premedicated before session;Repositioned     Hand Dominance Right   Extremity/Trunk Assessment Upper Extremity Assessment Upper Extremity Assessment: Overall WFL for tasks assessed   Lower Extremity Assessment Lower Extremity Assessment: Defer to PT evaluation;LLE deficits/detail LLE Deficits / Details: unable to left and place on bed surace. pt states " you have to pick up that L leg"   Cervical / Trunk Assessment Cervical / Trunk Assessment: Other exceptions Cervical / Trunk Exceptions: s/p surg   Communication Communication Communication: No difficulties   Cognition Arousal/Alertness: Awake/alert Behavior During Therapy: WFL for tasks assessed/performed Overall Cognitive Status: Within Functional Limits for tasks assessed                                     General Comments  educated on goals and purpose of OT pt agreeable and reports "i feel like everyday i will be able to get more done. I told my husband i can understand why a person would jump off a bridge now. I am not going to do that and i would never. I just mean living in pain and it being so constant. I just understand how they could start to think that way. " --- OT ot recommend pastoral care.     Exercises     Shoulder Instructions      Home Living Family/patient expects to be discharged to:: Private residence Living Arrangements: Spouse/significant other Available Help at Discharge: Family;Available 24 hours/day Type of Home: House Home Access: Stairs  to enter [employer] of Steps: several Entrance Stairs-Rails: Right;Left Home Layout: One level               Home Equipment: None          Prior Functioning/Environment Level of Independence: Independent        Comments: active in/around the home, drives        OT Problem List: Decreased activity tolerance;Decreased knowledge of precautions;Decreased knowledge of use of DME or AE;Decreased safety awareness;Decreased strength;Pain;Decreased range of motion;Impaired balance (sitting and/or standing)      OT Treatment/Interventions: Self-care/ADL training;Therapeutic activities;Therapeutic exercise;Neuromuscular education;DME and/or AE instruction;Energy conservation;Patient/family education;Balance training    OT Goals(Current goals can be found in the care plan section) Acute Rehab OT Goals Patient Stated Goal: pt wants to get back to being active and being able to walk without pain OT Goal Formulation: With patient  Time For Goal Achievement: 10/02/17 Potential to Achieve Goals: Good  OT Frequency: Min 3X/week   Barriers to D/C:            Co-evaluation              AM-PAC PT "6 Clicks" Daily Activity     Outcome Measure Help from another person eating meals?: A Little Help from another person taking care of personal grooming?: A Lot Help from another person toileting, which includes using toliet, bedpan, or urinal?: A Lot Help from another person bathing (including washing, rinsing, drying)?: A Lot Help from another person to put on and taking off regular upper body clothing?: A Lot Help from another person to put on and taking off regular lower body clothing?: Total 6 Click Score: 12   End of Session Equipment Utilized During Treatment: Back brace Nurse Communication: Mobility status;Precautions  Activity Tolerance: Patient tolerated treatment well Patient left: in bed;with call bell/phone within reach;with family/visitor present  OT  Visit Diagnosis: Unsteadiness on feet (R26.81);Muscle weakness (generalized) (M62.81)                Time: 1916-6060 OT Time Calculation (min): 21 min Charges:  OT General Charges $OT Visit: 1 Visit OT Evaluation $OT Eval Moderate Complexity: 1 Mod G-Codes:      Jeri Modena   OTR/L Pager: [phone] Office: [phone] .   Parke Poisson B 09/18/2017, 3:11 PM

## 2017-09-18 NOTE — Progress Notes (Signed)
Initial Nutrition Assessment  DOCUMENTATION CODES:   Not applicable  INTERVENTION:  Continue Ensure Enlive po BID, each supplement provides 350 kcal and 20 grams of protein.  Encourage adequate PO intake.   Recommend obtaining new weight to fully assess weight trends.   NUTRITION DIAGNOSIS:   Inadequate oral intake related to poor appetite as evidenced by per patient/family report.  GOAL:   Patient will meet greater than or equal to 90% of their needs  MONITOR:   PO intake, Supplement acceptance, Labs, Weight trends, I & O's, Skin  REASON FOR ASSESSMENT:   Malnutrition Screening Tool    ASSESSMENT:   72 year old female with new diagnosis of probable metastatic cancer to L3.   Meal completion has been 50%. Pt reports having a decreased appetite which has been ongoing over the past 1 month. Pt reports she still consumes small meals frequently throughout the day and tries to ensure adequate nutrition met. Pt currently has Ensure ordered and has been consuming them. RD to continue with current orders to aid in caloric and protein needs. Noted no new weight recorded. Recommend obtaining new weight to fully assess weight trends. Pt does report she feels she has lost 2 lbs over the past 4 weeks.   Labs and medications reviewed.   Limited physical exam performed as pt reports pain.    NUTRITION - FOCUSED PHYSICAL EXAM:    Most Recent Value  Orbital Region  Unable to assess  Upper Arm Region  Unable to assess  Thoracic and Lumbar Region  Unable to assess  Buccal Region  Unable to assess  Temple Region  Unable to assess  Clavicle Bone Region  Unable to assess  Clavicle and Acromion Bone Region  Unable to assess  Scapular Bone Region  Unable to assess  Dorsal Hand  Unable to assess  Patellar Region  No depletion  Anterior Thigh Region  No depletion  Posterior Calf Region  No depletion  Edema (RD Assessment)  None  Hair  Reviewed  Eyes  Reviewed  Mouth  Reviewed  Skin   Reviewed  Nails  Reviewed       Diet Order:  Precautions:  No bending, arching, or twisting Diet regular Room service appropriate? Yes; Fluid consistency: Thin Diet NPO time specified Except for: Sips with Meds  EDUCATION NEEDS:   Not appropriate for education at this time  Skin:  Skin Assessment: Skin Integrity Issues: Skin Integrity Issues:: Stage I Stage I: knees  Last BM:  4/23  Height:   Ht Readings from Last 1 Encounters:  09/05/17 5' 5"  (1.651 m)    Weight:   Wt Readings from Last 1 Encounters:  09/05/17 154 lb (69.9 kg)    Ideal Body Weight:  56.8 kg  BMI:  There is no height or weight on file to calculate BMI.  Estimated Nutritional Needs:   Kcal:  1800-2000  Protein:  80-90 grams  Fluid:  1.8 - 2 L/day    Corrin Parker, MS, RD, LDN Pager # [phone] After hours/ weekend pager # [phone]

## 2017-09-18 NOTE — Progress Notes (Signed)
   Subjective: Pt states she is feeling okay today.  Continues to have to lay on her stomach due to lower back discomfort.  Denies any SOB, n/v  Objective:  Vital signs in last 24 hours: Vitals:   09/18/17 0350 09/18/17 0400 09/18/17 0753 09/18/17 0754  BP:  137/61 (!) 149/69 (!) 149/69  Pulse:  64 65 64  Resp:  16 15 15   Temp: 98 F (36.7 C)  97.8 F (36.6 C)   TempSrc: Oral  Oral   SpO2:  92% 96% 96%   Physical Exam  Constitutional: She is oriented to person, place, and time. No distress.  Cardiovascular: Normal rate and regular rhythm. Exam reveals no gallop and no friction rub.  Murmur (2/6 systolic murmur likely AS) heard. Pulmonary/Chest: Effort normal and breath sounds normal. No respiratory distress. She has no wheezes. She has no rales. She exhibits no tenderness.  Abdominal: Soft. Bowel sounds are normal. She exhibits no distension and no mass. There is no tenderness. There is no rebound and no guarding.  Musculoskeletal:       Lumbar back: She exhibits tenderness (exquisitely tender).  Neurological: She is alert and oriented to person, place, and time.  Normal sensation in bilateral LE.  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  She had an outpatient MRI for lower back pain and this revealed metastatic disease with epidural tumor. CT results confirm suspicion of likely primary lung source.  Other potential sources found being colon and kidney but less likely.    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

## 2017-09-18 NOTE — Progress Notes (Addendum)
Physical Therapy Treatment Patient Details Name: Julia Nguyen MRN: [id number] DOB: 03/12/1946 Today's Date: 09/18/2017    History of Present Illness pt is a 72 y/o female with no significant medical hx except vertigo, admitted under neruosurgery [facility] for management of L3 pathological fracture due to metastatic disease, MRI showing epidural tumor.  Work up to determine primary site for CA pending.    PT Comments    Pt admitted with/for pain due to L3 pathological fracture.  Pt currently limited functionally due to the problems listed below.  (see problems list.)  Pt will benefit from PT to maximize function and safety to be able to get home safely with available assist .    Follow Up Recommendations  Supervision/Assistance - 24 hour;CIR     Equipment Recommendations  Other (comment)(TBA)    Recommendations for Other Services       Precautions / Restrictions Precautions Precautions: Back Required Braces or Orthoses: Spinal Brace Spinal Brace: Lumbar corset;Applied in supine position Restrictions Weight Bearing Restrictions: No    Mobility  Bed Mobility Overal bed mobility: Needs Assistance Bed Mobility: Rolling Rolling: Modified independent (Device/Increase time)         General bed mobility comments: cued to minimize the twisting, but pt otherwise moves effortlessly.  Transfers Overall transfer level: Needs assistance   Transfers: Sit to/from Stand Sit to Stand: Min guard         General transfer comment: pt stood by back off the bed in prone until her feet touched the floor then completed a push up to stand upright.  Ambulation/Gait Ambulation/Gait assistance: Min guard Ambulation Distance (Feet): 14 Feet(to extent of the purewick and limited by start of pain.) Assistive device: Rolling walker (2 wheeled) Gait Pattern/deviations: Step-through pattern     General Gait Details: generally steady considering the pt has been off her feet for 2 weeks.  It was  appropriate to use a RW today for mild weakness   Stairs             Wheelchair Mobility    Modified Rankin (Stroke Patients Only)       Balance                                            Cognition Arousal/Alertness: Awake/alert Behavior During Therapy: WFL for tasks assessed/performed Overall Cognitive Status: Within Functional Limits for tasks assessed                                        Exercises      General Comments General comments (skin integrity, edema, etc.): Initiated back care education       Pertinent Vitals/Pain Pain Assessment: 0-10 Pain Score: 5  Pain Location: back  in standing Pain Descriptors / Indicators: Sore Pain Intervention(s): Monitored during session    Home Living Family/patient expects to be discharged to:: Private residence Living Arrangements: Spouse/significant other Available Help at Discharge: Family;Available 24 hours/day Type of Home: House Home Access: Stairs to enter Entrance Stairs-Rails: Right;Left Home Layout: One level Home Equipment: None      Prior Function Level of Independence: Independent      Comments: active in/around the home, drives   PT Goals (current goals can now be found in the care plan section) Acute Rehab PT Goals Patient  Stated Goal: independent and active.  Figure out what we're going to do. PT Goal Formulation: With patient Time For Goal Achievement: 10/02/17 Potential to Achieve Goals: Good    Frequency    Min 5X/week      PT Plan      Co-evaluation              AM-PAC PT "6 Clicks" Daily Activity  Outcome Measure  Difficulty turning over in bed (including adjusting bedclothes, sheets and blankets)?: None Difficulty moving from lying on back to sitting on the side of the bed? : None Difficulty sitting down on and standing up from a chair with arms (e.g., wheelchair, bedside commode, etc,.)?: A Little Help needed moving to and from a  bed to chair (including a wheelchair)?: A Little Help needed walking in hospital room?: A Little Help needed climbing 3-5 steps with a railing? : A Lot 6 Click Score: 19    End of Session   Activity Tolerance: Patient tolerated treatment well Patient left: in bed;with call bell/phone within reach Nurse Communication: Mobility status PT Visit Diagnosis: Other abnormalities of gait and mobility (R26.89);Muscle weakness (generalized) (M62.81)     Time: 9702-6378 PT Time Calculation (min) (ACUTE ONLY): 36 min  Charges:  $Gait Training: 8-22 mins                    G Codes:       09-19-2017  Donnella Sham, PT [phone]  (pager)   Julia Nguyen 19-Sep-2017, 2:51 PM

## 2017-09-18 NOTE — Progress Notes (Signed)
OT NOTE  Pt likes to lay in prone due to pain management. Pt could benefit from staff helping to position bed at an angle to allow watch TV or the doorway instead of the wall.    Jeri Modena   OTR/L Pager: [phone] Office: [phone] .

## 2017-09-19 ENCOUNTER — Inpatient Hospital Stay (HOSPITAL_COMMUNITY): Payer: Medicare Other

## 2017-09-19 ENCOUNTER — Other Ambulatory Visit: Payer: Self-pay

## 2017-09-19 ENCOUNTER — Encounter (HOSPITAL_COMMUNITY): Payer: Self-pay | Admitting: General Practice

## 2017-09-19 DIAGNOSIS — D497 Neoplasm of unspecified behavior of endocrine glands and other parts of nervous system: Secondary | ICD-10-CM

## 2017-09-19 DIAGNOSIS — R6881 Early satiety: Secondary | ICD-10-CM

## 2017-09-19 DIAGNOSIS — C799 Secondary malignant neoplasm of unspecified site: Secondary | ICD-10-CM

## 2017-09-19 LAB — GLUCOSE, CAPILLARY: GLUCOSE-CAPILLARY: 108 mg/dL — AB (ref 65–99)

## 2017-09-19 MED ORDER — DEXAMETHASONE SODIUM PHOSPHATE 10 MG/ML IJ SOLN
6.0000 mg | Freq: Four times a day (QID) | INTRAMUSCULAR | Status: DC
  Administered 2017-09-19 – 2017-09-28 (×37): 6 mg via INTRAVENOUS
  Filled 2017-09-19 (×38): qty 1

## 2017-09-19 MED ORDER — MIDAZOLAM HCL 2 MG/2ML IJ SOLN
INTRAMUSCULAR | Status: AC
  Filled 2017-09-19: qty 4

## 2017-09-19 MED ORDER — FENTANYL CITRATE (PF) 100 MCG/2ML IJ SOLN
INTRAMUSCULAR | Status: AC
  Filled 2017-09-19: qty 2

## 2017-09-19 MED ORDER — LIDOCAINE HCL 1 % IJ SOLN
INTRAMUSCULAR | Status: AC
  Filled 2017-09-19: qty 20

## 2017-09-19 MED ORDER — FENTANYL CITRATE (PF) 100 MCG/2ML IJ SOLN
INTRAMUSCULAR | Status: AC | PRN
  Administered 2017-09-19: 25 ug via INTRAVENOUS
  Administered 2017-09-19: 50 ug via INTRAVENOUS
  Administered 2017-09-19: 25 ug via INTRAVENOUS

## 2017-09-19 MED ORDER — MIDAZOLAM HCL 2 MG/2ML IJ SOLN
INTRAMUSCULAR | Status: AC | PRN
  Administered 2017-09-19 (×2): 0.5 mg via INTRAVENOUS
  Administered 2017-09-19: 1 mg via INTRAVENOUS

## 2017-09-19 NOTE — Consult Note (Addendum)
Radiation Oncology         [id number]) [phone] ________________________________  Name: Julia Nguyen        MRN: [id number]  Date of Service: 09/19/17 DOB: [birth year]  WU:[id number], No Pcp Per  No ref. provider found     REFERRING PHYSICIAN: Dr. Saintclair Halsted  DIAGNOSIS: The primary encounter diagnosis was Closed burst fracture of lumbar vertebra, sequela. Diagnoses of Cancer associated pain and Metastatic cancer [facility]) were also pertinent to this visit.   HISTORY OF PRESENT ILLNESS: Julia Nguyen is a 72 y.o. female seen at the request of Dr. Saintclair Halsted for newly noted disease in the lumbar spine concerning for metastatic disease, and imaging pattern concerning for primarycolon cancer. The patient reports recent abrupt onset of low back pain, primarily on the left that progressed in the last two weeks, leading to weakness and inability to stand due to the weakness as well as pain. She has had progressive constipation in the last few months and had gone almost 8 days between movements prior to her hospitalization. 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 She was admitted and metastatic work up has revealed a 10 x 9 mm spiculated nodule in the right upper lobe, with multiple smaller bilateral nodules and 9 mm right hilar node. She has a 10 mm focal low attenuation lesion in the left liver, and hypervascular lesion consistent with vascular lesion, She did however have a 2 x 2.6 x 2.2 cm mass in the RLQ along the cecum which enhances proximal to the ileocecal valve.  A 9 mm hepatoduodenal ligament node was seen and there were several nodes adjacent to the cecum, worrisome for metastatic disease. She is to  undergo CT guided biopsy of her Lumbar disease today. We're asked to see the patient to consider palliative radiotherapay to the lumbar spine if she does not end up having decompression surgery.    PREVIOUS RADIATION THERAPY: No   PAST MEDICAL HISTORY:  Past Medical History:  Diagnosis Date  . Arthritis    "possibly in some joints" (09/19/2017)  . Lower back pain        PAST SURGICAL HISTORY: Past Surgical History:  Procedure Laterality Date  . NO PAST SURGERIES       FAMILY HISTORY: History reviewed. No pertinent family history.   SOCIAL HISTORY:  reports that she has been smoking cigarettes.  She has a 92.00 pack-year smoking history. She has never used smokeless tobacco. She reports that she does not drink alcohol or use drugs.   ALLERGIES: Patient has no known allergies.   MEDICATIONS:  Current Facility-Administered Medications  Medication Dose Route Frequency Provider Last Rate Last Dose  . 0.9 %  sodium chloride infusion  250 mL Intravenous Continuous Kary Kos, MD      . acetaminophen (TYLENOL) tablet 650 mg  650 mg Oral Q4H PRN Kary Kos, MD       Or  . acetaminophen (TYLENOL) suppository 650 mg  650 mg Rectal Q4H PRN Kary Kos, MD      . alum & mag hydroxide-simeth (MAALOX/MYLANTA) 200-200-20 MG/5ML suspension 30 mL  30 mL Oral Q6H PRN Kary Kos, MD      . cyclobenzaprine (FLEXERIL) tablet 10 mg  10 mg Oral TID PRN Kary Kos, MD   10 mg at 09/17/17 1719  . dexamethasone (DECADRON) injection 6 mg  6 mg Intravenous Q6H Meyran, Ocie Cornfield, NP      . feeding supplement (ENSURE ENLIVE) (ENSURE ENLIVE) liquid 237 mL  237 mL Oral BID BM Kary Kos, MD   237 mL at 09/18/17 1657  . HYDROmorphone (DILAUDID) injection 1 mg  1 mg Intravenous Q2H PRN Kary Kos, MD      . meclizine (ANTIVERT) tablet 25 mg  25 mg Oral PRN Kary Kos, MD      . menthol-cetylpyridinium (CEPACOL) lozenge 3 mg  1 lozenge Oral PRN Kary Kos, MD       Or  . phenol (CHLORASEPTIC) mouth  spray 1 spray  1 spray Mouth/Throat PRN Kary Kos, MD      . methocarbamol (ROBAXIN) tablet 500 mg  500 mg Oral BID Kary Kos, MD   500 mg at 09/19/17 1016  . nicotine (NICODERM CQ - dosed in mg/24 hours) patch 14 mg  14 mg Transdermal Daily Hoffman, Jessica Ratliff, DO   14 mg at 09/19/17 1020  . ondansetron (ZOFRAN) tablet 4 mg  4 mg Oral Q6H PRN Kary Kos, MD       Or  . ondansetron [facility]) injection 4 mg  4 mg Intravenous Q6H PRN Kary Kos, MD      . oxyCODONE (Oxy IR/ROXICODONE) immediate release tablet 15 mg  15 mg Oral Q3H PRN Kary Kos, MD   15 mg at 09/19/17 0544  . pantoprazole (PROTONIX) EC tablet 80 mg  80 mg Oral BID Kary Kos, MD   80 mg at 09/19/17 1016  . sodium chloride flush (NS) 0.9 % injection 3 mL  3 mL Intravenous Q12H Kary Kos, MD   3 mL at 09/19/17 1017  . sodium chloride flush (NS) 0.9 % injection 3 mL  3 mL Intravenous PRN Kary Kos, MD      . traMADol Veatrice Bourbon) tablet 50 mg  50 mg Oral Q6H PRN Kary Kos, MD   50 mg at 09/18/17 2122     REVIEW OF SYSTEMS: On review of systems, the patient reports that She is doing okay but states she can only get comfortable laying prone. She denies any chest pain, shortness of breath, cough, fevers, chills, night sweats, unintended weight changes. She reports her bowels haven't moved since coming to the hospital. 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 She reports her back pain is significant and keeps her from being able to sit up or lay on her back, and that walking is very diffiuclty. She feels stronger since she's been on steroids. She has previously had episodes of abdominal pain in the right abdomen but reports she thought this was due to constipation in the last two months. Today, she denies abdominal pain, nausea or vomiting. She denies any additional musculoskeletal or joint aches or pains. A complete review of systems is obtained and is otherwise  negative.     PHYSICAL EXAM:  Wt Readings from Last 3 Encounters:  09/05/17 154 lb (69.9 kg)  08/19/17 150 lb (68 kg)   Temp Readings from Last 3 Encounters:  09/19/17 98.2 F (36.8 C) (Oral)  09/05/17 98.3 F (36.8 C) (Oral)  08/19/17 98.5 F (36.9 C)   BP Readings from Last 3 Encounters:  09/19/17 (!) 150/63  09/05/17 (!) 128/59  08/19/17 (!) 155/72   Pulse Readings  from Last 3 Encounters:  09/19/17 68  09/05/17 75  08/19/17 85   Pain Assessment Pain Score: 0-No pain/10  In general this is a tired appearing caucasian female in no acute distress. She is alert and oriented x4 and appropriate throughout the examination. She is lying prone on the hospital bed. HEENT reveals that the patient is normocephalic, atraumatic. EOMs are intact. PERRLA. Skin is intact without any evidence of gross lesions. Cardiopulmonary assessment is negative for acute distress and She exhibits normal effort. Lower extremities are negative for pretibial pitting edema, deep calf tenderness, cyanosis or clubbing. She has 5/5 strength of bilateral lower extremities and her lower extremities are intact to light touch.   ECOG = 4  0 - Asymptomatic (Fully active, able to carry on all predisease activities without restriction)  1 - Symptomatic but completely ambulatory (Restricted in physically strenuous activity but ambulatory and able to carry out work of a light or sedentary nature. For example, light housework, office work)  2 - Symptomatic, <50% in bed during the day (Ambulatory and capable of all self care but unable to carry out any work activities. Up and about more than 50% of waking hours)  3 - Symptomatic, >50% in bed, but not bedbound (Capable of only limited self-care, confined to bed or chair 50% or more of waking hours)  4 - Bedbound (Completely disabled. Cannot carry on any self-care. Totally confined to bed or chair)  5 - Death   Eustace Pen MM, Creech RH, Tormey DC, et al. [phone]). "Toxicity  and response criteria of the [facility]". [place] Oncol. 5 (6): 649-55    LABORATORY DATA:  Lab Results  Component Value Date   WBC 14.3 (H) 09/17/2017   HGB 15.8 (H) 09/17/2017   HCT 46.4 (H) 09/17/2017   MCV 88.9 09/17/2017   PLT 257 09/17/2017   Lab Results  Component Value Date   NA 136 09/17/2017   K 4.2 09/17/2017   CL 102 09/17/2017   CO2 24 09/17/2017   Lab Results  Component Value Date   ALT 22 09/17/2017   AST 19 09/17/2017   ALKPHOS 71 09/17/2017   BILITOT 0.4 09/17/2017      RADIOGRAPHY: Ct Chest W Contrast  Result Date: 09/18/2017 CLINICAL DATA:  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 Evaluate for primary malignancy. EXAM: CT CHEST, ABDOMEN, AND PELVIS WITH CONTRAST TECHNIQUE: Multidetector CT imaging of the chest, abdomen and pelvis was performed following the standard protocol during bolus administration of intravenous contrast. CONTRAST:  159mL ISOVUE-300 IOPAMIDOL (ISOVUE-300) INJECTION 61% COMPARISON:  Lumbar spine MRI 09/15/2017 FINDINGS: CT CHEST FINDINGS Cardiovascular: The heart size is normal. No pericardial effusion. Coronary artery calcification is evident. Atherosclerotic calcification is noted in the wall of the thoracic aorta. Mediastinum/Nodes: 4.2 x 2.9 cm necrotic nodal mass identified in the precarinal station. 13 mm short axis subcarinal lymph node evident. Necrotic right hilar lymphadenopathy measures 2 cm in short axis. 9 mm short axis right hilar lymph node evident. Tiny hiatal hernia. The esophagus has normal imaging features. There is no axillary lymphadenopathy. Multiple thyroid nodules are evident bilaterally. Lungs/Pleura: Moderate changes of emphysema noted with upper lobe predominance. 10 x 9 mm spiculated nodule identified in the right upper lobe (image 29/series 4). Additional (approximately 20) smaller nodules are identified in the lungs bilaterally, ranging in size from  several mm up to about 5 mm. No focal airspace consolidation. No pleural effusion. Musculoskeletal: No lytic  or sclerotic osseous abnormality identified in the thorax. CT ABDOMEN PELVIS FINDINGS Hepatobiliary: 10 mm focal area of low attenuation is identified in the medial segment left liver along the falciform ligament. This is in a characteristic location for focal fatty deposition, but more rounded and focal than typically seen in the setting of focal fat. 16 mm hypervascular lesion in the posterior right liver (61/3) is associated with apparent early filling of a right hepatic vein branch. There is no evidence for gallstones, gallbladder wall thickening, or pericholecystic fluid. No intrahepatic or extrahepatic biliary dilation. Pancreas: No focal mass lesion. No dilatation of the main duct. No intraparenchymal cyst. No peripancreatic edema. Spleen: No splenomegaly. No focal mass lesion. Adrenals/Urinary Tract: Thickening of the adrenal glands noted bilaterally with potential tiny bilateral adrenal nodules measuring in the 7-8 mm size range. 12 mm lesion upper pole right kidney (6/8) has attenuation too high to be a simple cyst and has a suggestion of enhancement. 2 cm lesion in the lower pole the left kidney (70/3) has irregular margins and apparent irregular peripheral low level enhancement (see image 21/delayed series 8). No evidence for hydroureter. The urinary bladder appears normal for the degree of distention. Stomach/Bowel: Tiny hiatal hernia. Stomach otherwise unremarkable. Duodenum is normally positioned as is the ligament of Treitz. No small bowel wall thickening. No small bowel dilatation. The terminal ileum is normal. The appendix is normal. Cecal tip is directed cranially in the right lower quadrant with the tip of the cecum up under the right liver. 2.0 x 2.6 x 2.2 cm rim enhancing mass is identified in the wall of the cecum proximal to the ileocecal valve (although positioned cranially), see  image 79/3 and image 43/coronal series 6). Diverticular changes are noted in the left colon without evidence of diverticulitis. Vascular/Lymphatic: There is abdominal aortic atherosclerosis without aneurysm. No gastrohepatic ligament lymphadenopathy. 9 mm short axis lymph node in the hepatoduodenal ligament is upper normal. No para-aortic lymphadenopathy. No pelvic sidewall lymphadenopathy. Lymph nodes are identified adjacent to the cecum (image 81/series 3 and there is an 8 mm short axis lymph node in the ileocolic mesentery (07/3) worrisome for metastatic disease. Reproductive: Uterus unremarkable. 2.4 cm benign appearing cystic lesion identified in the left ovary. Right ovary unremarkable. Other: No intraperitoneal free fluid. Musculoskeletal: Left groin hernia contains only fat. Compression fracture at L3 again noted. Epidural and paraspinal tumor was better demonstrated on the previous lumbar spine MRI. IMPRESSION: 1. Multiple bilateral pulmonary nodules are associated with necrotic and relatively bulky mediastinal and right hilar lymphadenopathy. These imaging features are consistent with metastatic disease in a patient with recently demonstrated osseous metastatic involvement. 2. 10 mm spiculated nodule in the peripheral right upper lobe is the dominant lung nodule on today's study. This could represent a primary malignancy and account for the metastatic disease in the lungs and mediastinum, but please see below. 3. 2.6 cm irregular peripherally enhancing lesion in the cecum, highly concerning for primary colorectal neoplasm. Small para cecal lymph nodes and borderline enlarged ileocolic lymph node are concerning for metastatic disease. 4. 2 cm lesion in the lower pole of the left kidney appears to have irregular peripheral low level enhancement. A similar but smaller 12 mm lesion is identified in the upper pole the right kidney. These could potentially represent complex cysts, apparent peripheral enhancement  raises concern for neoplasm. While metastatic disease to the kidneys is not common, the bilateral involvement raises this question. Bilateral primary renal neoplasm also consideration. Dedicated abdominal MRI without and  with contrast would likely prove helpful to further evaluate. 5. Low-density in the medial segment left liver adjacent to the falciform ligament is in a characteristic location for focal fatty deposition but the appearance is more focal and rounded than typically seen. This could also be further assessed at the time of MRI. Small hypervascular lesion in the posterior right liver likely vascular malformation, but could also be further assessed at MRI. 6. Probable tiny bilateral adrenal nodules. 7.  Aortic Atherosclerois (ICD10-170.0) 8.  Emphysema. ([id number]-W10.9) The findings of this exam were discussed with the Internal Medicine clinical team at the time of study interpretation. Electronically Signed   By: Misty Stanley M.D.   On: 09/18/2017 10:50   Mr Lumbar Spine Wo Contrast  Result Date: 09/15/2017 CLINICAL DATA:  Low back pain for 5 weeks, anterior leg pain. EXAM: MRI LUMBAR SPINE WITHOUT CONTRAST TECHNIQUE: Multiplanar, multisequence MR imaging of the lumbar spine was performed. No intravenous contrast was administered. COMPARISON:  Lumbar spine radiographs September 09, 2017 FINDINGS: SEGMENTATION: For the purposes of this report, the last well-formed intervertebral disc is reported as L5-S1. ALIGNMENT: Maintained lumbar lordosis. Minimal grade 1 L1-2 retrolisthesis. No spondylolysis. VERTEBRAE:Moderate L3 fracture approximate 50% central height loss. Diffusely low T1 signal L3 vertebral body extending into bilateral pedicles and LEFT transverse process. Similarly decreased signal 11 mm lesion inferior endplate of L2, 11 mm lesion superior endplate of L4 and 14 mm lesion RIGHT iliac bone. Mild T11-12, mild L4-5 and moderate L5-S1 disc height loss with desiccation and proportional chronic  discogenic endplate changes. L2-3 and L3-4 disc edema. CONUS MEDULLARIS AND CAUDA EQUINA: Conus medullaris terminates at L2 and demonstrates normal morphology and signal characteristics. Cauda equina is normal. PARASPINAL AND OTHER SOFT TISSUES: Nonacute. Abnormal low T1, intermediate T2 11 mm mass LEFT paraspinal muscles at L3, 8 mm mass LEFT paraspinal muscles at L1. DISC LEVELS: T12-L1: No disc bulge, canal stenosis nor neural foraminal narrowing. L1-2: Retrolisthesis. Small broad-based disc bulge. No canal stenosis or neural foraminal narrowing. L2-3: Moderate broad-based disc bulge. Tumoral extension through L3 vertebral body into ventral dural space measuring to 9 mm in AP dimension. Moderate canal stenosis at L3 with lateral recess effacement likely affecting the traversing LEFT greater than RIGHT L3 nerves. No neural foraminal narrowing. L3-4: Small broad-based disc bulge. Tumoral expansion through LEFT vertebral body. Mild canal stenosis with narrowed LEFT lateral recess likely affecting the traversing LEFT L4 nerve. Moderate bilateral neural foraminal narrowing. Exited LEFT L3 nerve impingement. L4-5: Moderate broad-based disc bulge asymmetric to LEFT. Mild facet arthropathy and ligamentum flavum redundancy. Mild canal stenosis. LEFT annular fissure. Mild RIGHT, mild to moderate LEFT neural foraminal narrowing. L5-S1: Moderate broad-based disc bulge asymmetric to LEFT versus LEFT extraforaminal disc protrusion contacting the exited LEFT L5 nerve. Moderate to severe facet arthropathy without canal stenosis. Severe LEFT neural foraminal narrowing. IMPRESSION: 1. 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 2. Moderate L3 pathologic fracture. Tumoral extends into epidural space resulting in moderate canal stenosis. 3. Mild canal stenosis L3-4 and L4-5. Neural foraminal narrowing L3-4 through L5-S1: Severe on the LEFT at L5-S1. 4. These results will be called to the ordering clinician or  representative by the Radiologist Assistant, and communication documented in the PACS or zVision Dashboard. Electronically Signed   By: Elon Alas M.D.   On: 09/15/2017 19:26   Ct Abdomen Pelvis W Contrast  Result Date: 09/18/2017 CLINICAL DATA:  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 Evaluate for primary  malignancy. EXAM: CT CHEST, ABDOMEN, AND PELVIS WITH CONTRAST TECHNIQUE: Multidetector CT imaging of the chest, abdomen and pelvis was performed following the standard protocol during bolus administration of intravenous contrast. CONTRAST:  139mL ISOVUE-300 IOPAMIDOL (ISOVUE-300) INJECTION 61% COMPARISON:  Lumbar spine MRI 09/15/2017 FINDINGS: CT CHEST FINDINGS Cardiovascular: The heart size is normal. No pericardial effusion. Coronary artery calcification is evident. Atherosclerotic calcification is noted in the wall of the thoracic aorta. Mediastinum/Nodes: 4.2 x 2.9 cm necrotic nodal mass identified in the precarinal station. 13 mm short axis subcarinal lymph node evident. Necrotic right hilar lymphadenopathy measures 2 cm in short axis. 9 mm short axis right hilar lymph node evident. Tiny hiatal hernia. The esophagus has normal imaging features. There is no axillary lymphadenopathy. Multiple thyroid nodules are evident bilaterally. Lungs/Pleura: Moderate changes of emphysema noted with upper lobe predominance. 10 x 9 mm spiculated nodule identified in the right upper lobe (image 29/series 4). Additional (approximately 20) smaller nodules are identified in the lungs bilaterally, ranging in size from several mm up to about 5 mm. No focal airspace consolidation. No pleural effusion. Musculoskeletal: No lytic or sclerotic osseous abnormality identified in the thorax. CT ABDOMEN PELVIS FINDINGS Hepatobiliary: 10 mm focal area of low attenuation is identified in the medial segment left liver along the falciform ligament. This is in a characteristic location for focal  fatty deposition, but more rounded and focal than typically seen in the setting of focal fat. 16 mm hypervascular lesion in the posterior right liver (61/3) is associated with apparent early filling of a right hepatic vein branch. There is no evidence for gallstones, gallbladder wall thickening, or pericholecystic fluid. No intrahepatic or extrahepatic biliary dilation. Pancreas: No focal mass lesion. No dilatation of the main duct. No intraparenchymal cyst. No peripancreatic edema. Spleen: No splenomegaly. No focal mass lesion. Adrenals/Urinary Tract: Thickening of the adrenal glands noted bilaterally with potential tiny bilateral adrenal nodules measuring in the 7-8 mm size range. 12 mm lesion upper pole right kidney (6/8) has attenuation too high to be a simple cyst and has a suggestion of enhancement. 2 cm lesion in the lower pole the left kidney (70/3) has irregular margins and apparent irregular peripheral low level enhancement (see image 21/delayed series 8). No evidence for hydroureter. The urinary bladder appears normal for the degree of distention. Stomach/Bowel: Tiny hiatal hernia. Stomach otherwise unremarkable. Duodenum is normally positioned as is the ligament of Treitz. No small bowel wall thickening. No small bowel dilatation. The terminal ileum is normal. The appendix is normal. Cecal tip is directed cranially in the right lower quadrant with the tip of the cecum up under the right liver. 2.0 x 2.6 x 2.2 cm rim enhancing mass is identified in the wall of the cecum proximal to the ileocecal valve (although positioned cranially), see image 79/3 and image 43/coronal series 6). Diverticular changes are noted in the left colon without evidence of diverticulitis. Vascular/Lymphatic: There is abdominal aortic atherosclerosis without aneurysm. No gastrohepatic ligament lymphadenopathy. 9 mm short axis lymph node in the hepatoduodenal ligament is upper normal. No para-aortic lymphadenopathy. No pelvic  sidewall lymphadenopathy. Lymph nodes are identified adjacent to the cecum (image 81/series 3 and there is an 8 mm short axis lymph node in the ileocolic mesentery (16/9) worrisome for metastatic disease. Reproductive: Uterus unremarkable. 2.4 cm benign appearing cystic lesion identified in the left ovary. Right ovary unremarkable. Other: No intraperitoneal free fluid. Musculoskeletal: Left groin hernia contains only fat. Compression fracture at L3 again noted. Epidural and paraspinal tumor  was better demonstrated on the previous lumbar spine MRI. IMPRESSION: 1. Multiple bilateral pulmonary nodules are associated with necrotic and relatively bulky mediastinal and right hilar lymphadenopathy. These imaging features are consistent with metastatic disease in a patient with recently demonstrated osseous metastatic involvement. 2. 10 mm spiculated nodule in the peripheral right upper lobe is the dominant lung nodule on today's study. This could represent a primary malignancy and account for the metastatic disease in the lungs and mediastinum, but please see below. 3. 2.6 cm irregular peripherally enhancing lesion in the cecum, highly concerning for primary colorectal neoplasm. Small para cecal lymph nodes and borderline enlarged ileocolic lymph node are concerning for metastatic disease. 4. 2 cm lesion in the lower pole of the left kidney appears to have irregular peripheral low level enhancement. A similar but smaller 12 mm lesion is identified in the upper pole the right kidney. These could potentially represent complex cysts, apparent peripheral enhancement raises concern for neoplasm. While metastatic disease to the kidneys is not common, the bilateral involvement raises this question. Bilateral primary renal neoplasm also consideration. Dedicated abdominal MRI without and with contrast would likely prove helpful to further evaluate. 5. Low-density in the medial segment left liver adjacent to the falciform ligament  is in a characteristic location for focal fatty deposition but the appearance is more focal and rounded than typically seen. This could also be further assessed at the time of MRI. Small hypervascular lesion in the posterior right liver likely vascular malformation, but could also be further assessed at MRI. 6. Probable tiny bilateral adrenal nodules. 7.  Aortic Atherosclerois (ICD10-170.0) 8.  Emphysema. ([id number]-S06.9) The findings of this exam were discussed with the Internal Medicine clinical team at the time of study interpretation. Electronically Signed   By: Misty Stanley M.D.   On: 09/18/2017 10:50       IMPRESSION/PLAN: 1. Metastatic lesions to the lumbar spine with clinical picture concerning for metastatic carcinoma of either lung or GI primary. I spent time meeting with the patient today to review her MRI results. Dr. Windy Carina plan is to proceed with IR biopsy of the vertebral body today and I've asked pathology to put a rush on the read. We hope to know more about the histology by tomorrow afternoon. If she does not have a radiosensitive tumor, Dr. Saintclair Halsted [place] consider decompression and stabilization, however if she does have a radiosensitive cancer, we would anticipate a course of palliative radiotherapy to the lower lumbar spine. Her symptoms appear to be confined mainly to the left L4 site, and she continues to lay prone for comfort. She continues to be neurologically intact to sensory and motor function assessment. She will continue her steroids and pain medication. Hopefully her biopsy will identify the source of her presumed cancer diagnosis. If not, she is aware that this could lead to additional tissue sampling. Regarding radiotherapy, we discussed risks, benefits, short, and long term effects of treatment and would anticipate a course of 2 weeks to the lumbar spine. She will meet with medical oncology as well once her primary is known. I anticipate simulation tomorrow and we will bring her by  [facility]. She is tentatively scheduled for this at 10 am tomorrow. If she will proceed with radiotherapy, we would anticipate this starting next Monday at [facility]. She could certainly transfer to [facility] at that point if she is not having a neursurgical procedure. She will continue her current steroid regimen per Dr. Saintclair Halsted.      Carola Rhine,  PAC

## 2017-09-19 NOTE — Procedures (Signed)
  Procedure: CT core bx L3 lesion 18g x4 EBL:   minimal Complications:  none immediate  See full dictation in [facility].  Dillard Cannon MD Main # [phone] Pager  [phone]

## 2017-09-19 NOTE — Progress Notes (Signed)
Subjective: Patient reports pain has improved some, still lying prone.  Objective: Vital signs in last 24 hours: Temp:  [97.7 F (36.5 C)-98.2 F (36.8 C)] 98.2 F (36.8 C) (04/25 0750) Pulse Rate:  [65-82] 68 (04/25 0800) Resp:  [11-21] 21 (04/25 0800) BP: (130-160)/(63-72) 150/63 (04/25 0750) SpO2:  [93 %-96 %] 93 % (04/25 0800)  Intake/Output from previous day: 04/24 0701 - 04/25 0700 In: 480 [P.O.:480] Out: 200 [Urine:200] Intake/Output this shift: Total I/O In: 3 [I.V.:3] Out: 550 [Urine:550]  Neurologic: Grossly normal  Lab Results: Lab Results  Component Value Date   WBC 14.3 (H) 09/17/2017   HGB 15.8 (H) 09/17/2017   HCT 46.4 (H) 09/17/2017   MCV 88.9 09/17/2017   PLT 257 09/17/2017   Lab Results  Component Value Date   INR 0.93 09/18/2017   BMET Lab Results  Component Value Date   NA 136 09/17/2017   K 4.2 09/17/2017   CL 102 09/17/2017   CO2 24 09/17/2017   GLUCOSE 140 (H) 09/17/2017   BUN 16 09/17/2017   CREATININE 0.76 09/17/2017   CALCIUM 9.3 09/17/2017    Studies/Results: Ct Chest W Contrast  Result Date: 09/18/2017 CLINICAL DATA:  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 Evaluate for primary malignancy. EXAM: CT CHEST, ABDOMEN, AND PELVIS WITH CONTRAST TECHNIQUE: Multidetector CT imaging of the chest, abdomen and pelvis was performed following the standard protocol during bolus administration of intravenous contrast. CONTRAST:  180mL ISOVUE-300 IOPAMIDOL (ISOVUE-300) INJECTION 61% COMPARISON:  Lumbar spine MRI 09/15/2017 FINDINGS: CT CHEST FINDINGS Cardiovascular: The heart size is normal. No pericardial effusion. Coronary artery calcification is evident. Atherosclerotic calcification is noted in the wall of the thoracic aorta. Mediastinum/Nodes: 4.2 x 2.9 cm necrotic nodal mass identified in the precarinal station. 13 mm short axis subcarinal lymph node evident. Necrotic right hilar lymphadenopathy measures 2  cm in short axis. 9 mm short axis right hilar lymph node evident. Tiny hiatal hernia. The esophagus has normal imaging features. There is no axillary lymphadenopathy. Multiple thyroid nodules are evident bilaterally. Lungs/Pleura: Moderate changes of emphysema noted with upper lobe predominance. 10 x 9 mm spiculated nodule identified in the right upper lobe (image 29/series 4). Additional (approximately 20) smaller nodules are identified in the lungs bilaterally, ranging in size from several mm up to about 5 mm. No focal airspace consolidation. No pleural effusion. Musculoskeletal: No lytic or sclerotic osseous abnormality identified in the thorax. CT ABDOMEN PELVIS FINDINGS Hepatobiliary: 10 mm focal area of low attenuation is identified in the medial segment left liver along the falciform ligament. This is in a characteristic location for focal fatty deposition, but more rounded and focal than typically seen in the setting of focal fat. 16 mm hypervascular lesion in the posterior right liver (61/3) is associated with apparent early filling of a right hepatic vein branch. There is no evidence for gallstones, gallbladder wall thickening, or pericholecystic fluid. No intrahepatic or extrahepatic biliary dilation. Pancreas: No focal mass lesion. No dilatation of the main duct. No intraparenchymal cyst. No peripancreatic edema. Spleen: No splenomegaly. No focal mass lesion. Adrenals/Urinary Tract: Thickening of the adrenal glands noted bilaterally with potential tiny bilateral adrenal nodules measuring in the 7-8 mm size range. 12 mm lesion upper pole right kidney (6/8) has attenuation too high to be a simple cyst and has a suggestion of enhancement. 2 cm lesion in the lower pole the left kidney (70/3) has irregular margins and apparent irregular peripheral low level enhancement (see  image 21/delayed series 8). No evidence for hydroureter. The urinary bladder appears normal for the degree of distention. Stomach/Bowel:  Tiny hiatal hernia. Stomach otherwise unremarkable. Duodenum is normally positioned as is the ligament of Treitz. No small bowel wall thickening. No small bowel dilatation. The terminal ileum is normal. The appendix is normal. Cecal tip is directed cranially in the right lower quadrant with the tip of the cecum up under the right liver. 2.0 x 2.6 x 2.2 cm rim enhancing mass is identified in the wall of the cecum proximal to the ileocecal valve (although positioned cranially), see image 79/3 and image 43/coronal series 6). Diverticular changes are noted in the left colon without evidence of diverticulitis. Vascular/Lymphatic: There is abdominal aortic atherosclerosis without aneurysm. No gastrohepatic ligament lymphadenopathy. 9 mm short axis lymph node in the hepatoduodenal ligament is upper normal. No para-aortic lymphadenopathy. No pelvic sidewall lymphadenopathy. Lymph nodes are identified adjacent to the cecum (image 81/series 3 and there is an 8 mm short axis lymph node in the ileocolic mesentery (19/3) worrisome for metastatic disease. Reproductive: Uterus unremarkable. 2.4 cm benign appearing cystic lesion identified in the left ovary. Right ovary unremarkable. Other: No intraperitoneal free fluid. Musculoskeletal: Left groin hernia contains only fat. Compression fracture at L3 again noted. Epidural and paraspinal tumor was better demonstrated on the previous lumbar spine MRI. IMPRESSION: 1. Multiple bilateral pulmonary nodules are associated with necrotic and relatively bulky mediastinal and right hilar lymphadenopathy. These imaging features are consistent with metastatic disease in a patient with recently demonstrated osseous metastatic involvement. 2. 10 mm spiculated nodule in the peripheral right upper lobe is the dominant lung nodule on today's study. This could represent a primary malignancy and account for the metastatic disease in the lungs and mediastinum, but please see below. 3. 2.6 cm irregular  peripherally enhancing lesion in the cecum, highly concerning for primary colorectal neoplasm. Small para cecal lymph nodes and borderline enlarged ileocolic lymph node are concerning for metastatic disease. 4. 2 cm lesion in the lower pole of the left kidney appears to have irregular peripheral low level enhancement. A similar but smaller 12 mm lesion is identified in the upper pole the right kidney. These could potentially represent complex cysts, apparent peripheral enhancement raises concern for neoplasm. While metastatic disease to the kidneys is not common, the bilateral involvement raises this question. Bilateral primary renal neoplasm also consideration. Dedicated abdominal MRI without and with contrast would likely prove helpful to further evaluate. 5. Low-density in the medial segment left liver adjacent to the falciform ligament is in a characteristic location for focal fatty deposition but the appearance is more focal and rounded than typically seen. This could also be further assessed at the time of MRI. Small hypervascular lesion in the posterior right liver likely vascular malformation, but could also be further assessed at MRI. 6. Probable tiny bilateral adrenal nodules. 7.  Aortic Atherosclerois (ICD10-170.0) 8.  Emphysema. ([id number]-O97.9) The findings of this exam were discussed with the Internal Medicine clinical team at the time of study interpretation. Electronically Signed   By: Misty Stanley M.D.   On: 09/18/2017 10:50   Ct Abdomen Pelvis W Contrast  Result Date: 09/18/2017 CLINICAL DATA:  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 Evaluate for primary malignancy. EXAM: CT CHEST, ABDOMEN, AND PELVIS WITH CONTRAST TECHNIQUE: Multidetector CT imaging of the chest, abdomen and pelvis was performed following the standard protocol during bolus administration of intravenous contrast. CONTRAST:  167mL ISOVUE-300 IOPAMIDOL (ISOVUE-300) INJECTION  61% COMPARISON:   Lumbar spine MRI 09/15/2017 FINDINGS: CT CHEST FINDINGS Cardiovascular: The heart size is normal. No pericardial effusion. Coronary artery calcification is evident. Atherosclerotic calcification is noted in the wall of the thoracic aorta. Mediastinum/Nodes: 4.2 x 2.9 cm necrotic nodal mass identified in the precarinal station. 13 mm short axis subcarinal lymph node evident. Necrotic right hilar lymphadenopathy measures 2 cm in short axis. 9 mm short axis right hilar lymph node evident. Tiny hiatal hernia. The esophagus has normal imaging features. There is no axillary lymphadenopathy. Multiple thyroid nodules are evident bilaterally. Lungs/Pleura: Moderate changes of emphysema noted with upper lobe predominance. 10 x 9 mm spiculated nodule identified in the right upper lobe (image 29/series 4). Additional (approximately 20) smaller nodules are identified in the lungs bilaterally, ranging in size from several mm up to about 5 mm. No focal airspace consolidation. No pleural effusion. Musculoskeletal: No lytic or sclerotic osseous abnormality identified in the thorax. CT ABDOMEN PELVIS FINDINGS Hepatobiliary: 10 mm focal area of low attenuation is identified in the medial segment left liver along the falciform ligament. This is in a characteristic location for focal fatty deposition, but more rounded and focal than typically seen in the setting of focal fat. 16 mm hypervascular lesion in the posterior right liver (61/3) is associated with apparent early filling of a right hepatic vein branch. There is no evidence for gallstones, gallbladder wall thickening, or pericholecystic fluid. No intrahepatic or extrahepatic biliary dilation. Pancreas: No focal mass lesion. No dilatation of the main duct. No intraparenchymal cyst. No peripancreatic edema. Spleen: No splenomegaly. No focal mass lesion. Adrenals/Urinary Tract: Thickening of the adrenal glands noted bilaterally with potential tiny bilateral adrenal nodules measuring  in the 7-8 mm size range. 12 mm lesion upper pole right kidney (6/8) has attenuation too high to be a simple cyst and has a suggestion of enhancement. 2 cm lesion in the lower pole the left kidney (70/3) has irregular margins and apparent irregular peripheral low level enhancement (see image 21/delayed series 8). No evidence for hydroureter. The urinary bladder appears normal for the degree of distention. Stomach/Bowel: Tiny hiatal hernia. Stomach otherwise unremarkable. Duodenum is normally positioned as is the ligament of Treitz. No small bowel wall thickening. No small bowel dilatation. The terminal ileum is normal. The appendix is normal. Cecal tip is directed cranially in the right lower quadrant with the tip of the cecum up under the right liver. 2.0 x 2.6 x 2.2 cm rim enhancing mass is identified in the wall of the cecum proximal to the ileocecal valve (although positioned cranially), see image 79/3 and image 43/coronal series 6). Diverticular changes are noted in the left colon without evidence of diverticulitis. Vascular/Lymphatic: There is abdominal aortic atherosclerosis without aneurysm. No gastrohepatic ligament lymphadenopathy. 9 mm short axis lymph node in the hepatoduodenal ligament is upper normal. No para-aortic lymphadenopathy. No pelvic sidewall lymphadenopathy. Lymph nodes are identified adjacent to the cecum (image 81/series 3 and there is an 8 mm short axis lymph node in the ileocolic mesentery (67/2) worrisome for metastatic disease. Reproductive: Uterus unremarkable. 2.4 cm benign appearing cystic lesion identified in the left ovary. Right ovary unremarkable. Other: No intraperitoneal free fluid. Musculoskeletal: Left groin hernia contains only fat. Compression fracture at L3 again noted. Epidural and paraspinal tumor was better demonstrated on the previous lumbar spine MRI. IMPRESSION: 1. Multiple bilateral pulmonary nodules are associated with necrotic and relatively bulky mediastinal and  right hilar lymphadenopathy. These imaging features are consistent with metastatic disease in a  patient with recently demonstrated osseous metastatic involvement. 2. 10 mm spiculated nodule in the peripheral right upper lobe is the dominant lung nodule on today's study. This could represent a primary malignancy and account for the metastatic disease in the lungs and mediastinum, but please see below. 3. 2.6 cm irregular peripherally enhancing lesion in the cecum, highly concerning for primary colorectal neoplasm. Small para cecal lymph nodes and borderline enlarged ileocolic lymph node are concerning for metastatic disease. 4. 2 cm lesion in the lower pole of the left kidney appears to have irregular peripheral low level enhancement. A similar but smaller 12 mm lesion is identified in the upper pole the right kidney. These could potentially represent complex cysts, apparent peripheral enhancement raises concern for neoplasm. While metastatic disease to the kidneys is not common, the bilateral involvement raises this question. Bilateral primary renal neoplasm also consideration. Dedicated abdominal MRI without and with contrast would likely prove helpful to further evaluate. 5. Low-density in the medial segment left liver adjacent to the falciform ligament is in a characteristic location for focal fatty deposition but the appearance is more focal and rounded than typically seen. This could also be further assessed at the time of MRI. Small hypervascular lesion in the posterior right liver likely vascular malformation, but could also be further assessed at MRI. 6. Probable tiny bilateral adrenal nodules. 7.  Aortic Atherosclerois (ICD10-170.0) 8.  Emphysema. ([id number]-L24.9) The findings of this exam were discussed with the Internal Medicine clinical team at the time of study interpretation. Electronically Signed   By: Misty Stanley M.D.   On: 09/18/2017 10:50    Assessment/Plan: Continue pain management and  therapies. Plan for biopsy in IR today   LOS: 2 days    Ocie Cornfield [facility] 09/19/2017, 11:11 AM

## 2017-09-19 NOTE — Progress Notes (Signed)
   Subjective: Pt reports improvement in pain.  She reports she is not eating well due to early satiety.  No shortness of breath, nausea or vomiting. Has not had a bowel movement yet but does not feel discomfort, having flatus.    Objective:  Vital signs in last 24 hours: Vitals:   09/19/17 0407 09/19/17 0750 09/19/17 0800 09/19/17 1150  BP: (!) 158/68 (!) 150/63  134/62  Pulse: 71 65 68 64  Resp: 11 16 (!) 21 12  Temp: 97.8 F (36.6 C) 98.2 F (36.8 C)  97.9 F (36.6 C)  TempSrc: Oral Oral  Oral  SpO2: 94% 94% 93% 96%   Physical Exam  Constitutional: She is oriented to person, place, and time. No distress.  Cardiovascular: Normal rate and regular rhythm. Exam reveals no gallop and no friction rub.  Murmur (2/6 systolic murmur likely AS) heard. Pulmonary/Chest: Effort normal and breath sounds normal. No respiratory distress. She has no wheezes. She has no rales. She exhibits no tenderness.  Abdominal: Soft. Bowel sounds are normal. She exhibits no distension and no mass. There is no tenderness. There is no rebound and no guarding.  Musculoskeletal:       Lumbar back: She exhibits tenderness (exquisitely tender).  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  She had an outpatient MRI for lower back pain and this revealed metastatic disease with epidural tumor. CT results confirm suspicion of likely primary lung source. Other potential sources found being colon and kidney but less likely.     -IR CT guided biopsy today -radiation oncology consulted  -will need biopsy results to determine surgical vs radiation for palliation -will check daily cbg and keep an eye on blood glucose as pt on decadron   Dispo: Anticipated discharge pending results  Katherine Roan, MD 09/19/2017, 11:55 AM Vickki Muff  MD PGY-1 Internal Medicine Pager # [phone]

## 2017-09-19 NOTE — Progress Notes (Signed)
PT Cancellation Note  Patient Details Name: Julia Nguyen MRN: [id number] DOB: 1946-01-22   Cancelled Treatment:    Reason Eval/Treat Not Completed: Patient declined, no reason specified.  Pt going sometime for biopsy, she is not in the right frame of mind to do anything now.  Will check back as able. 09/19/2017  Julia Nguyen, Ruth [phone]  (pager)   Julia Nguyen 09/19/2017, 10:20 AM

## 2017-09-19 NOTE — Progress Notes (Signed)
Spoke with RN Olivia Mackie to coordinate the patients visit to [facility] on 09/20/2017.  I informed the RN that the patient needs to be here at 9:45 am for a 10:00 am CT simulation.  She voiced understanding and she will pass the information on to the Education officer, museum.  I left a call back number of [phone] should any questions or concerns arise.  Will continue to follow as necessary.  Cori Razor, RN

## 2017-09-20 ENCOUNTER — Ambulatory Visit
Admit: 2017-09-20 | Discharge: 2017-09-20 | Disposition: A | Discharge status: Home / Self Care | Payer: Medicare Other | Attending: Radiation Oncology | Admitting: Radiation Oncology

## 2017-09-20 DIAGNOSIS — C7951 Secondary malignant neoplasm of bone: Secondary | ICD-10-CM

## 2017-09-20 DIAGNOSIS — C7952 Secondary malignant neoplasm of bone marrow: Principal | ICD-10-CM

## 2017-09-20 LAB — BASIC METABOLIC PANEL
Anion gap: 9 (ref 5–15)
BUN: 27 mg/dL — AB (ref 6–20)
CHLORIDE: 103 mmol/L (ref 101–111)
CO2: 26 mmol/L (ref 22–32)
CREATININE: 0.74 mg/dL (ref 0.44–1.00)
Calcium: 9.1 mg/dL (ref 8.9–10.3)
GFR calc Af Amer: >60 mL/min (ref >60)
GFR calc non Af Amer: >60 mL/min (ref >60)
Glucose, Bld: 110 mg/dL — ABNORMAL HIGH (ref 65–99)
POTASSIUM: 4.3 mmol/L (ref 3.5–5.1)
Sodium: 138 mmol/L (ref 135–145)

## 2017-09-20 LAB — CBC
HCT: 46.8 % — ABNORMAL HIGH (ref 36.0–46.0)
HEMOGLOBIN: 15.6 g/dL — AB (ref 12.0–15.0)
MCH: 30 pg (ref 26.0–34.0)
MCHC: 33.3 g/dL (ref 30.0–36.0)
MCV: 90 fL (ref 78.0–100.0)
Platelets: 304 10*3/uL (ref 150–400)
RBC: 5.2 MIL/uL — AB (ref 3.87–5.11)
RDW: 12.8 % (ref 11.5–15.5)
WBC: 13.4 10*3/uL — ABNORMAL HIGH (ref 4.0–10.5)

## 2017-09-20 NOTE — Progress Notes (Signed)
Physical Therapy Treatment Patient Details Name: Julia Nguyen MRN: [id number] DOB: Pelissier 12, 1947 Today's Date: 09/20/2017    History of Present Illness pt is a 72 y/o female with no significant medical hx except vertigo, admitted under neruosurgery servica for management of L3 pathological fracture due to metastatic disease, MRI showing epidural tumor.  Work up to determine primary site for CA pending.    PT Comments    Pt more limited by pain in standing today.  Unable to ambulate and the burning pain, radicular to L LE remained after she returned to prone.   Follow Up Recommendations  Supervision/Assistance - 24 hour;CIR     Equipment Recommendations  Other (comment)(TBA)    Recommendations for Other Services       Precautions / Restrictions Precautions Precautions: Back Required Braces or Orthoses: Spinal Brace Spinal Brace: Lumbar corset;Applied in sitting position    Mobility  Bed Mobility Overal bed mobility: Needs Assistance Bed Mobility: Rolling Rolling: Modified independent (Device/Increase time)         General bed mobility comments: Pt in prone position,  Requires min A to back off EOB and move into standing with +2 for safety.   Pt requires assist to lift LEs back onto bed when returning to prone   Transfers Overall transfer level: Needs assistance   Transfers: (prone <> standing )           General transfer comment: requires min A to move backwards into standing.  Attained full upright stance, but unable to talerate pain to try ambulation  Ambulation/Gait                 Stairs             Wheelchair Mobility    Modified Rankin (Stroke Patients Only)       Balance Overall balance assessment: Needs assistance     Sitting balance - Comments: unable attempt at this time   Standing balance support: Bilateral upper extremity supported Standing balance-Leahy Scale: Poor Standing balance comment: reliant on bil. UE support due to  increased pain                             Cognition Arousal/Alertness: Awake/alert Behavior During Therapy: Anxious Overall Cognitive Status: Within Functional Limits for tasks assessed                                        Exercises      General Comments General comments (skin integrity, edema, etc.): total A to don/doff brace.  Pt very anxious due to increased pain       Pertinent Vitals/Pain Pain Assessment: 0-10 Pain Score: 10-Worst pain ever Pain Location: back radiating anteriorly and into thigh  Pain Descriptors / Indicators: Burning;Grimacing;Guarding;Restless;Spasm Pain Intervention(s): Monitored during session;Repositioned;Premedicated before session;Heat applied;Limited activity within patient's tolerance    Home Living                      Prior Function            PT Goals (current goals can now be found in the care plan section) Acute Rehab PT Goals Patient Stated Goal: pt wants to get back to being active and being able to walk without pain PT Goal Formulation: With patient Time For Goal Achievement: 10/02/17 Potential to Achieve Goals: Good Progress towards  PT goals: Not progressing toward goals - comment(pain very limiting to mobility)    Frequency    Min 5X/week      PT Plan Current plan remains appropriate    Co-evaluation PT/OT/SLP Co-Evaluation/Treatment: Yes Reason for Co-Treatment: For patient/therapist safety PT goals addressed during session: Mobility/safety with mobility OT goals addressed during session: Strengthening/ROM;ADL's and self-care      AM-PAC PT "6 Clicks" Daily Activity  Outcome Measure  Difficulty turning over in bed (including adjusting bedclothes, sheets and blankets)?: None Difficulty moving from lying on back to sitting on the side of the bed? : None Difficulty sitting down on and standing up from a chair with arms (e.g., wheelchair, bedside commode, etc,.)?: A Little Help  needed moving to and from a bed to chair (including a wheelchair)?: A Little Help needed walking in hospital room?: A Little Help needed climbing 3-5 steps with a railing? : A Lot 6 Click Score: 19    End of Session Equipment Utilized During Treatment: Back brace Activity Tolerance: Patient limited by pain Patient left: in bed;with call bell/phone within reach Nurse Communication: Mobility status PT Visit Diagnosis: Other abnormalities of gait and mobility (R26.89);Muscle weakness (generalized) (M62.81)     Time: 2761-4709 PT Time Calculation (min) (ACUTE ONLY): 14 min  Charges:  $Therapeutic Activity: 8-22 mins                    G Codes:       10-04-17  Donnella Sham, PT [phone]  (pager)   Julia Nguyen 10, 2019, 5:34 PM

## 2017-09-20 NOTE — Progress Notes (Signed)
   Subjective: Pt reports improvement in pain. Discomfort overnight but pain regimen overall working well.  No shortness of breath, no chest pain  Objective:  Vital signs in last 24 hours: Vitals:   09/20/17 0750 09/20/17 0900 09/20/17 1000 09/20/17 1213  BP:  105/70 118/73 (!) 132/58  Pulse:  75 80 78  Resp:  11 18   Temp: 98 F (36.7 C)   98.8 F (37.1 C)  TempSrc: Oral   Oral  SpO2:  96% 96% 92%   Physical Exam  Constitutional: She is oriented to person, place, and time. No distress.  Cardiovascular: Normal rate and regular rhythm. Exam reveals no gallop and no friction rub.  Murmur (2/6 systolic murmur likely AS) heard. Pulmonary/Chest: Effort normal and breath sounds normal. No respiratory distress. She has no wheezes. She has no rales. She exhibits no tenderness.  Abdominal: Soft. Bowel sounds are normal. She exhibits no distension and no mass. There is no tenderness. There is no rebound and no guarding.  Musculoskeletal:       Lumbar back: She exhibits tenderness (exquisitely tender).  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  She had an outpatient MRI for lower back pain and this revealed metastatic disease with epidural tumor. CT results confirm suspicion of likely primary lung source. Other potential sources found being colon and kidney but less likely.     -biopsy results pending -will need biopsy results to determine surgical vs radiation for palliation -pt will likely be transferred to [facility] -blood glucose, hgb, renal function and electrolytes stable  Dispo: Anticipated discharge pending results  Katherine Roan, MD 09/20/2017, 2:15 PM Vickki Muff MD PGY-1 Internal Medicine Pager # [phone]

## 2017-09-20 NOTE — Progress Notes (Signed)
Occupational Therapy Treatment Patient Details Name: Julia Nguyen MRN: [id number] DOB: 11-04-1945 Today's Date: 09/20/2017    History of present illness pt is a 72 y/o female with no significant medical hx except vertigo, admitted under neruosurgery servica for management of L3 pathological fracture due to metastatic disease, MRI showing epidural tumor.  Work up to determine primary site for CA pending.   OT comments  Pt seen with PT today as pt requires +2 assist for safety due to severity of pain.  She was only able to tolerate moving prone to standing.  Attempted to adjust brace in standing, but pt unable to tolerate continued standing due to radiating back pain and returned to prone with min A +2.   Once pain is controlled and she is better able to tolerate activity , Aul benefit from CIR.   Follow Up Recommendations  CIR    Equipment Recommendations  3 in 1 bedside commode    Recommendations for Other Services Rehab consult    Precautions / Restrictions Precautions Precautions: Back Required Braces or Orthoses: Spinal Brace Spinal Brace: Lumbar corset;Applied in sitting position       Mobility Bed Mobility Overal bed mobility: Needs Assistance Bed Mobility: Rolling Rolling: Modified independent (Device/Increase time)         General bed mobility comments: Pt in prone position,  Requires min A to back off EOB and move into standing with +2 for safety.   Pt requires assist to lift LEs back onto bed when returning to prone   Transfers Overall transfer level: Needs assistance   Transfers: (prone <> standing )           General transfer comment: requires min A to move backwards into standing.      Balance Overall balance assessment: Needs assistance     Sitting balance - Comments: unable attempt at this time   Standing balance support: Bilateral upper extremity supported Standing balance-Leahy Scale: Poor Standing balance comment: reliant on bil. UE support  due to increased pain                            ADL either performed or assessed with clinical judgement   ADL Overall ADL's : Needs assistance/impaired                     Lower Body Dressing: Total assistance;Sit to/from stand Lower Body Dressing Details (indicate cue type and reason): Pt unable to attempt due to severity of back pain              Functional mobility during ADLs: Minimal assistance;+2 for physical assistance;+2 for safety/equipment General ADL Comments: limited to prone <>  standing only due to severity of pain      Vision       Perception     Praxis      Cognition Arousal/Alertness: Awake/alert Behavior During Therapy: Anxious Overall Cognitive Status: Within Functional Limits for tasks assessed                                          Exercises     Shoulder Instructions       General Comments total A to don/doff brace.  Pt very anxious due to increased pain     Pertinent Vitals/ Pain       Pain Assessment: 0-10 Pain  Score: 10-Worst pain ever Pain Location: back radiating anteriorly and into thigh  Pain Descriptors / Indicators: Burning;Grimacing;Guarding;Restless;Spasm Pain Intervention(s): Repositioned;Heat applied;Premedicated before session;Limited activity within patient's tolerance  Home Living                                          Prior Functioning/Environment              Frequency           Progress Toward Goals  OT Goals(current goals can now be found in the care plan section)  Progress towards OT goals: Not progressing toward goals - comment(due to increased pain )     Plan Discharge plan remains appropriate    Co-evaluation    PT/OT/SLP Co-Evaluation/Treatment: Yes Reason for Co-Treatment: For patient/therapist safety;To address functional/ADL transfers   OT goals addressed during session: Strengthening/ROM;ADL's and self-care      AM-PAC PT "6  Clicks" Daily Activity     Outcome Measure   Help from another person eating meals?: A Little Help from another person taking care of personal grooming?: A Lot Help from another person toileting, which includes using toliet, bedpan, or urinal?: Total Help from another person bathing (including washing, rinsing, drying)?: A Lot Help from another person to put on and taking off regular upper body clothing?: Total Help from another person to put on and taking off regular lower body clothing?: Total 6 Click Score: 10    End of Session Equipment Utilized During Treatment: Back brace  OT Visit Diagnosis: Unsteadiness on feet (R26.81);Muscle weakness (generalized) (M62.81)   Activity Tolerance Patient limited by pain   Patient Left in bed;with call bell/phone within reach   Nurse Communication Mobility status;Patient requests pain meds        Time: 1443-1457 OT Time Calculation (min): 14 min  Charges: OT General Charges $OT Visit: 1 Visit OT Evaluation $OT Eval Moderate Complexity: (no OT charge due to pt required +2 assist)  Lucille Passy, OTR/L [phone]    Lucille Passy M 09/20/2017, 3:40 PM

## 2017-09-20 NOTE — Progress Notes (Signed)
Subjective: Patient reports Overall patient doing better radicular pain much better already and is centralized to her back now she did have the biopsy yesterday and tolerated procedure well. She had a rough night with pain management but seems her back on track this morning.  Objective: Vital signs in last 24 hours: Temp:  [97.9 F (36.6 C)-99.3 F (37.4 C)] 98 F (36.7 C) (04/26 0750) Pulse Rate:  [59-78] 75 (04/26 0900) Resp:  [11-18] 11 (04/26 0900) BP: (105-166)/(57-83) 105/70 (04/26 0900) SpO2:  [91 %-99 %] 96 % (04/26 0900)  Intake/Output from previous day: 04/25 0701 - 04/26 0700 In: 363 [P.O.:360; I.V.:3] Out: 1150 [Urine:1150] Intake/Output this shift: Total I/O In: 360 [P.O.:360] Out: -   Neurologically nonfocal  Lab Results: Recent Labs    09/17/17 1928 09/20/17 0409  WBC 14.3* 13.4*  HGB 15.8* 15.6*  HCT 46.4* 46.8*  PLT 257 304   BMET Recent Labs    09/17/17 1928 09/20/17 0409  NA 136 138  K 4.2 4.3  CL 102 103  CO2 24 26  GLUCOSE 140* 110*  BUN 16 27*  CREATININE 0.76 0.74  CALCIUM 9.3 9.1    Studies/Results: No results found.  Assessment/Plan: Status post biopsy await luminary results to see if this is a chemotherapy or radiosensitive mass. If patient's back pain persists certainly could be considered for a kyphotic vertebroplasty however I would hold off until we have tissue diagnosis to ensure that we do not need to do a more aggressive operation. Patient should be continue to mobilize with physical outpatient therapy I think it's okay to transition the patient to [facility] at any point, with improvement of radicular pain I think is looking less likely that we'll have to do a decompressive operation.  LOS: 3 days     Damarri Rampy P 09/20/2017, 9:25 AM

## 2017-09-21 MED ORDER — SENNOSIDES-DOCUSATE SODIUM 8.6-50 MG PO TABS
1.0000 | ORAL_TABLET | Freq: Every evening | ORAL | Status: DC | PRN
  Administered 2017-09-22: 1 via ORAL
  Filled 2017-09-21: qty 1

## 2017-09-21 MED ORDER — LACTULOSE 10 GM/15ML PO SOLN
30.0000 g | Freq: Once | ORAL | Status: AC | PRN
  Administered 2017-09-24: 30 g via ORAL
  Filled 2017-09-21: qty 45

## 2017-09-21 MED ORDER — POLYETHYLENE GLYCOL 3350 17 G PO PACK
17.0000 g | PACK | Freq: Every day | ORAL | Status: DC | PRN
  Administered 2017-09-23: 17 g via ORAL
  Filled 2017-09-21 (×2): qty 1

## 2017-09-21 NOTE — Progress Notes (Signed)
   Subjective: Pt reports good pain control this am.  Still lying in prone position. Difficult night overnight but pain regimen overall working well.  No shortness of breath, no chest pain.  Spent some time expressing her frustrations with the medical system as a whole.  Objective:  Vital signs in last 24 hours: Vitals:   09/21/17 0000 09/21/17 0345 09/21/17 0755 09/21/17 1134  BP:  98/69 (!) 152/65 132/72  Pulse:  69 (!) 59 77  Resp:  19    Temp:  98.4 F (36.9 C) 97.8 F (36.6 C) 98.1 F (36.7 C)  TempSrc: Oral Oral Oral Oral  SpO2:  90% 94% 94%   Physical Exam  Constitutional: She is oriented to person, place, and time. No distress.  Cardiovascular: Normal rate and regular rhythm. Exam reveals no gallop and no friction rub.  Murmur (2/6 systolic murmur likely AS) heard. Pulmonary/Chest: Effort normal and breath sounds normal. No respiratory distress. She has no wheezes. She has no rales. She exhibits no tenderness.  Abdominal: Soft. Bowel sounds are normal. She exhibits no distension and no mass. There is no tenderness. There is no rebound and no guarding.  Musculoskeletal:       Lumbar back: She exhibits tenderness (exquisitely tender).  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  She had an outpatient MRI for lower back pain and this revealed metastatic disease with epidural tumor. CT results confirm suspicion of likely primary lung source. Other potential sources found being colon and kidney but less likely.     -biopsy results pending -will need biopsy results to determine surgical vs radiation for palliation -pt will need to be transferred to [facility] for further management.  Will help in any way we can.   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

## 2017-09-21 NOTE — Progress Notes (Signed)
Patient ID: Julia Nguyen, female   DOB: Jan 28, 1946, 72 y.o.   MRN: [id number] Overall stable, much less pain, lying on her stomach, moves her legs well, transferred for XRT to start next week. Will ask medicine to take on their service to help arrange transfer to [facility] ( nursing suggests this is the appropriate measure)

## 2017-09-21 NOTE — Progress Notes (Signed)
Asked by Dr. Ronnald Ramp, Neurosurgery, to help assist with transfer of patient to [facility] for further care in radiation treatment. Hospitalist at [facility] accepted the patient earlier today.  Bed request was made. This was discussed with the patient and patient's nurse. Transfer orders were placed.  Nurse to call [facility] when bed available.

## 2017-09-21 NOTE — Discharge Summary (Signed)
Name: Julia Nguyen MRN: [id number] DOB: Swopes 28, 1947 72 y.o. PCP: Patient, No Pcp Per  Date of Admission: 09/17/2017  3:42 PM Date of Discharge: transferred to [facility] on 09/21/17 Attending Physician: Eugenie Filler, MD  Discharge Diagnosis:  Principal Problem:   Pathological fracture of lumbar vertebra Active Problems:   Lumbar burst fracture (HCC)   Pressure injury of skin   Metastatic cancer ([place])   Constipation   Leukocytosis   Colonic mass   Kidney lesion   Discharge Medications:   Disposition and follow-up:   Julia Nguyen was discharged from [facility] in stable condition.  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  Labs / imaging needed at time of follow-up: none  3.  Pending labs/ test needing follow-up: none  Follow-up Appointments:   Hospital Course by problem list: Principal Problem:   Pathological fracture of lumbar vertebra Active Problems:   Lumbar burst fracture (HCC)   Pressure injury of skin   Metastatic cancer ([place])   Constipation   Leukocytosis   Colonic mass   Kidney lesion   Patient was initially admitted by Dr. Saintclair Halsted her neurosurgeon after finding a spinal mass.  We were consulted to help identify the primary source as the spinal tumor was felt to be metastatic disease.  Given the patient's history there is a strong suspicion for a lung primary.  Other considerations were breast, colon, multiple myeloma.  CT chest abdomen pelvis with contrast demonstrated a small circular nodule consistent with a lung primary tumor.  Several other incidental neoplasms were found on the scans including a colon mass, renal well-circumscribed density.  Ultimately he was still felt that the lung was the most likely source of the patient's primary cancer.  Interventional radiology was consulted to perform a biopsy of  the spinal tumor.  Neurosurgery felt that this tumor would be best palliated by radiation.  It was then that we were asked to help transfer the patient [facility] and continue her treatment with radiation oncology, continuing to further identify the tumor and what chemotherapy it [place] best respond to.  The patient was transferred to [facility] long for further treatment.   Discharge Vitals:   BP (!) 136/56 (BP Location: Left Arm)   Pulse 65   Temp 98.4 F (36.9 C) (Oral)   Resp 20   Ht 5' 5"  (1.651 m)   Wt 150 lb (68 kg)   SpO2 96%   BMI 24.96 kg/m   Pertinent Labs, Studies, and Procedures:  CLINICAL DATA:  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 Evaluate for primary malignancy.   EXAM: CT CHEST, ABDOMEN, AND PELVIS WITH CONTRAST   TECHNIQUE: Multidetector CT imaging of the chest, abdomen and pelvis was performed following the standard protocol during bolus administration of intravenous contrast.   CONTRAST:  139m ISOVUE-300 IOPAMIDOL (ISOVUE-300) INJECTION 61%   COMPARISON:  Lumbar spine MRI 09/15/2017   FINDINGS: CT CHEST FINDINGS   Cardiovascular: The heart size is normal. No pericardial effusion. Coronary artery calcification is evident. Atherosclerotic calcification is noted in the wall of the thoracic aorta.   Mediastinum/Nodes: 4.2 x 2.9 cm necrotic nodal mass identified in the precarinal station. 13 mm short axis subcarinal lymph node evident. Necrotic right hilar lymphadenopathy measures 2 cm in short axis. 9 mm short axis right hilar lymph node evident. Tiny hiatal  hernia. The esophagus has normal imaging features. There is no axillary lymphadenopathy. Multiple thyroid nodules are evident bilaterally.   Lungs/Pleura: Moderate changes of emphysema noted with upper lobe predominance. 10 x 9 mm spiculated nodule identified in the right upper lobe (image 29/series 4). Additional (approximately 20) smaller nodules are identified  in the lungs bilaterally, ranging in size from several mm up to about 5 mm. No focal airspace consolidation. No pleural effusion.   Musculoskeletal: No lytic or sclerotic osseous abnormality identified in the thorax.   CT ABDOMEN PELVIS FINDINGS   Hepatobiliary: 10 mm focal area of low attenuation is identified in the medial segment left liver along the falciform ligament. This is in a characteristic location for focal fatty deposition, but more rounded and focal than typically seen in the setting of focal fat. 16 mm hypervascular lesion in the posterior right liver (61/3) is associated with apparent early filling of a right hepatic vein branch. There is no evidence for gallstones, gallbladder wall thickening, or pericholecystic fluid. No intrahepatic or extrahepatic biliary dilation.   Pancreas: No focal mass lesion. No dilatation of the main duct. No intraparenchymal cyst. No peripancreatic edema.   Spleen: No splenomegaly. No focal mass lesion.   Adrenals/Urinary Tract: Thickening of the adrenal glands noted bilaterally with potential tiny bilateral adrenal nodules measuring in the 7-8 mm size range.   12 mm lesion upper pole right kidney (6/8) has attenuation too high to be a simple cyst and has a suggestion of enhancement. 2 cm lesion in the lower pole the left kidney (70/3) has irregular margins and apparent irregular peripheral low level enhancement (see image 21/delayed series 8). No evidence for hydroureter. The urinary bladder appears normal for the degree of distention.   Stomach/Bowel: Tiny hiatal hernia. Stomach otherwise unremarkable. Duodenum is normally positioned as is the ligament of Treitz. No small bowel wall thickening. No small bowel dilatation. The terminal ileum is normal. The appendix is normal. Cecal tip is directed cranially in the right lower quadrant with the tip of the cecum up under the right liver. 2.0 x 2.6 x 2.2 cm rim enhancing mass  is identified in the wall of the cecum proximal to the ileocecal valve (although positioned cranially), see image 79/3 and image 43/coronal series 6). Diverticular changes are noted in the left colon without evidence of diverticulitis.   Vascular/Lymphatic: There is abdominal aortic atherosclerosis without aneurysm. No gastrohepatic ligament lymphadenopathy. 9 mm short axis lymph node in the hepatoduodenal ligament is upper normal. No para-aortic lymphadenopathy. No pelvic sidewall lymphadenopathy. Lymph nodes are identified adjacent to the cecum (image 81/series 3 and there is an 8 mm short axis lymph node in the ileocolic mesentery (54/6) worrisome for metastatic disease.   Reproductive: Uterus unremarkable. 2.4 cm benign appearing cystic lesion identified in the left ovary. Right ovary unremarkable.   Other: No intraperitoneal free fluid.   Musculoskeletal: Left groin hernia contains only fat. Compression fracture at L3 again noted. Epidural and paraspinal tumor was better demonstrated on the previous lumbar spine MRI.   IMPRESSION: 1. Multiple bilateral pulmonary nodules are associated with necrotic and relatively bulky mediastinal and right hilar lymphadenopathy. These imaging features are consistent with metastatic disease in a patient with recently demonstrated osseous metastatic involvement. 2. 10 mm spiculated nodule in the peripheral right upper lobe is the dominant lung nodule on today's study. This could represent a primary malignancy and account for the metastatic disease in the lungs and mediastinum, but please see below. 3. 2.6 cm irregular  peripherally enhancing lesion in the cecum, highly concerning for primary colorectal neoplasm. Small para cecal lymph nodes and borderline enlarged ileocolic lymph node are concerning for metastatic disease. 4. 2 cm lesion in the lower pole of the left kidney appears to have irregular peripheral low level enhancement. A similar  but smaller 12 mm lesion is identified in the upper pole the right kidney. These could potentially represent complex cysts, apparent peripheral enhancement raises concern for neoplasm. While metastatic disease to the kidneys is not common, the bilateral involvement raises this question. Bilateral primary renal neoplasm also consideration. Dedicated abdominal MRI without and with contrast would likely prove helpful to further evaluate. 5. Low-density in the medial segment left liver adjacent to the falciform ligament is in a characteristic location for focal fatty deposition but the appearance is more focal and rounded than typically seen. This could also be further assessed at the time of MRI. Small hypervascular lesion in the posterior right liver likely vascular malformation, but could also be further assessed at MRI. 6. Probable tiny bilateral adrenal nodules. 7.  Aortic Atherosclerois (ICD10-170.0) 8.  Emphysema. ([id number]-E32.9)   The findings of this exam were discussed with the Internal Medicine clinical team at the time of study interpretation.     Electronically Signed   By: Misty Stanley M.D.   On: 09/18/2017 10:50   Discharge Instructions:   Signed: Katherine Roan, MD 09/25/2017, 8:31 PM

## 2017-09-22 DIAGNOSIS — S32001S Stable burst fracture of unspecified lumbar vertebra, sequela: Secondary | ICD-10-CM

## 2017-09-22 NOTE — Progress Notes (Signed)
[facility] Progress Note  Patient: Julia Nguyen XLK:[id number]   PCP: Patient, No Pcp Per DOB: 04/16/1946   DOA: 09/17/2017   DOS: 09/22/2017   Date of Service: the patient was seen and examined on 09/22/2017  Subjective: Pain is well controlled.  No nausea no vomiting.  No fever no chills.  Constipated  Brief hospital course: Pt. with PMH of vertigo; admitted on 09/17/2017, presented with complaint of back pain, was found to have L3 pathological fracture secondary to metastatic disease with epidural tumor.  Underwent surgical biopsy and after consultation with radiation oncology patient was transferred to [facility] for radiation. Currently further plan is follow-up on recommendation from radiation oncology.  Assessment and Plan: 1.  Metastatic disease of the lumbar spine. L3 pathological fracture. IR guided CT core biopsy of the L3 lesion. Surgical pathology currently pending. The abdomen pelvis chest shows multiple bilateral pulmonary nodules with possibly primary lung malignancy on the right upper lobe. Also possibility of primary colorectal neoplasm in the cecum. Radiation oncology was consulted for palliative radiotherapy to the lumbar spine. We will also consult hematology for the patient tomorrow. Currently performing prone position only due to severe back pain in any other position. Currently on pain regimen with IV Dilaudid, Decadron, Flexeril, Robaxin, Hoxie. We will try to minimize medication after receiving radiation.  2.  Constipation. Continue bowel regimen.  Diet: regular diet DVT Prophylaxis: subcutaneous Heparin  Advance goals of care discussion: full code  Family Communication: no family was present at bedside, at the time of interview.  Disposition:  Discharge to be determined.  Consultants: Primary admission with neurosurgery, IR, and IM residency, radiation oncology Procedures: CT-guided core biopsy of the L3  lesion  Antibiotics: Anti-infectives (From admission, onward)   Start     Dose/Rate Route Frequency Ordered Stop   09/17/17 1800  cephALEXin (KEFLEX) capsule 500 mg  Status:  Discontinued     500 mg Oral 4 times daily 09/17/17 1609 09/17/17 1621       Objective: Physical Exam: Vitals:   09/21/17 1629 09/21/17 2040 09/22/17 0552 09/22/17 1407  BP: (!) 141/63 128/68 127/67 (!) 146/65  Pulse: (!) 44 64 (!) 40 71  Resp:   14   Temp: 97.8 F (36.6 C) 98 F (36.7 C) 98.7 F (37.1 C) 98.4 F (36.9 C)  TempSrc: Oral Oral Oral Oral  SpO2: 92% 94% 95% 93%  Weight:  68 kg (150 lb)    Height:  5\' 5"  (1.651 m)      Intake/Output Summary (Last 24 hours) at 09/22/2017 1932 Last data filed at 09/22/2017 1835 Gross per 24 hour  Intake 504 ml  Output 1495 ml  Net -991 ml   Filed Weights   09/21/17 2040  Weight: 68 kg (150 lb)   General: Alert, Awake and Oriented to Time, Place and Person. Appear in mild distress, affect appropriate Eyes: PERRL, Conjunctiva normal ENT: ral Mucosa clear moist Neck: difficult to assess JVD, no Abnormal Mass Or lumps Cardiovascular: S1 and S2 Present, no Murmur, Peripheral Pulses Present Respiratory: normal respiratory effort, Bilateral Air entry equal and Decreased, no use of accessory muscle, Clear to Auscultation, no Crackles, no wheezes Abdomen: Bowel Sound present, Soft and no tenderness, no hernia Skin: no redness, no Rash, no induration Extremities: no Pedal edema, no calf tenderness Neurologic: Grossly no focal neuro deficit. Bilaterally Equal motor strength  Data Reviewed: CBC: Recent Labs  Lab 09/17/17 1928 09/20/17 0409  WBC 14.3* 13.4*  HGB 15.8* 15.6*  HCT 46.4* 46.8*  MCV 88.9 90.0  PLT 257 983   Basic Metabolic Panel: Recent Labs  Lab 09/17/17 1928 09/20/17 0409  NA 136 138  K 4.2 4.3  CL 102 103  CO2 24 26  GLUCOSE 140* 110*  BUN 16 27*  CREATININE 0.76 0.74  CALCIUM 9.3 9.1    Liver Function Tests: Recent Labs   Lab 09/17/17 1928  AST 19  ALT 22  ALKPHOS 71  BILITOT 0.4  PROT 6.0*  ALBUMIN 3.1*   No results for input(s): LIPASE, AMYLASE in the last 168 hours. No results for input(s): AMMONIA in the last 168 hours. Coagulation Profile: Recent Labs  Lab 09/18/17 1450  INR 0.93   Cardiac Enzymes: No results for input(s): CKTOTAL, CKMB, CKMBINDEX, TROPONINI in the last 168 hours. BNP (last 3 results) No results for input(s): PROBNP in the last 8760 hours. CBG: Recent Labs  Lab 09/19/17 1650  GLUCAP 108*   Studies: No results found.  Scheduled Meds: . dexamethasone  6 mg Intravenous Q6H  . feeding supplement (ENSURE ENLIVE)  237 mL Oral BID BM  . methocarbamol  500 mg Oral BID  . nicotine  14 mg Transdermal Daily  . pantoprazole  80 mg Oral BID  . sodium chloride flush  3 mL Intravenous Q12H   Continuous Infusions: . sodium chloride     PRN Meds: acetaminophen **OR** acetaminophen, alum & mag hydroxide-simeth, cyclobenzaprine, HYDROmorphone (DILAUDID) injection, lactulose, meclizine, menthol-cetylpyridinium **OR** phenol, ondansetron **OR** ondansetron (ZOFRAN) IV, oxyCODONE, polyethylene glycol, senna-docusate, sodium chloride flush, traMADol  Time spent: 30 minutes  Author: Berle Mull, MD [facility] Pager: [phone] 09/22/2017 7:32 PM  If 7PM-7AM, please contact night-coverage at [email or web address], password [facility]

## 2017-09-23 ENCOUNTER — Encounter: Payer: Self-pay | Admitting: Internal Medicine

## 2017-09-23 ENCOUNTER — Ambulatory Visit
Admit: 2017-09-23 | Discharge: 2017-09-23 | Disposition: A | Discharge status: Home / Self Care | Payer: Medicare Other | Attending: Radiation Oncology | Admitting: Radiation Oncology

## 2017-09-23 LAB — CBC WITH DIFFERENTIAL/PLATELET
Basophils Absolute: 0 10*3/uL (ref 0.0–0.1)
Basophils Relative: 0 %
Eosinophils Absolute: 0 10*3/uL (ref 0.0–0.7)
Eosinophils Relative: 0 %
HEMATOCRIT: 50.6 % — AB (ref 36.0–46.0)
HEMOGLOBIN: 16.9 g/dL — AB (ref 12.0–15.0)
LYMPHS ABS: 1.6 10*3/uL (ref 0.7–4.0)
Lymphocytes Relative: 8 %
MCH: 30 pg (ref 26.0–34.0)
MCHC: 33.4 g/dL (ref 30.0–36.0)
MCV: 89.9 fL (ref 78.0–100.0)
MONO ABS: 0.6 10*3/uL (ref 0.1–1.0)
Monocytes Relative: 3 %
NEUTROS ABS: 17.6 10*3/uL — AB (ref 1.7–7.7)
NEUTROS PCT: 89 %
Platelets: 305 10*3/uL (ref 150–400)
RBC: 5.63 MIL/uL — ABNORMAL HIGH (ref 3.87–5.11)
RDW: 12.5 % (ref 11.5–15.5)
WBC: 19.8 10*3/uL — ABNORMAL HIGH (ref 4.0–10.5)

## 2017-09-23 LAB — COMPREHENSIVE METABOLIC PANEL
ALK PHOS: 69 U/L (ref 38–126)
ALT: 41 U/L (ref 14–54)
ANION GAP: 10 (ref 5–15)
AST: 21 U/L (ref 15–41)
Albumin: 3 g/dL — ABNORMAL LOW (ref 3.5–5.0)
BILIRUBIN TOTAL: 0.6 mg/dL (ref 0.3–1.2)
BUN: 26 mg/dL — ABNORMAL HIGH (ref 6–20)
CALCIUM: 9.4 mg/dL (ref 8.9–10.3)
CO2: 26 mmol/L (ref 22–32)
Chloride: 101 mmol/L (ref 101–111)
Creatinine, Ser: 0.7 mg/dL (ref 0.44–1.00)
GLUCOSE: 164 mg/dL — AB (ref 65–99)
POTASSIUM: 4.2 mmol/L (ref 3.5–5.1)
Sodium: 137 mmol/L (ref 135–145)
Total Protein: 6 g/dL — ABNORMAL LOW (ref 6.5–8.1)

## 2017-09-23 NOTE — Progress Notes (Signed)
OT Cancellation Note  Patient Details Name: Emiliana Blaize Kaiser MRN: [id number] DOB: [birth year]   Cancelled Treatment:    Reason Eval/Treat Not Completed: Pain limiting ability to participate  Pt agreed to therapy returning next day. Kari Baars, [place]  Payton Mccallum D 09/23/2017, 10:26 AM

## 2017-09-23 NOTE — Progress Notes (Signed)
PT Cancellation Note  Patient Details Name: Daneka Lantigua Labarge MRN: [id number] DOB: 06-Sokolowski-1947   Cancelled Treatment:    Reason Eval/Treat Not Completed: Pain limiting ability to participate, requesting to start radiation and to try OOB tomorrow,   Claretha Cooper 09/23/2017, 9:29 AM Tresa Endo PT [phone]

## 2017-09-23 NOTE — Care Management Important Message (Signed)
Important Message  Patient Details  Name: Julia Nguyen MRN: [id number] Date of Birth: 1945-09-23   Medicare Important Message Given:  Yes    Kerin Salen 09/23/2017, 11:05 AMImportant Message  Patient Details  Name: Julia Nguyen MRN: [id number] Date of Birth: Jul 11, 1945   Medicare Important Message Given:  Yes    Kerin Salen 09/23/2017, 11:05 AM

## 2017-09-23 NOTE — Progress Notes (Signed)
I spoke with the patient today and reviewed her case with her attending and consulting MDs. She will proceed with radiotherapy as outlined previously and is aware of her histology confirming Lung primary.     Julia Nguyen, PAC

## 2017-09-23 NOTE — Progress Notes (Signed)
[facility] Progress Note  Patient: Julia Nguyen URK:[id number]   PCP: Patient, No Pcp Per DOB: [birth year]   DOA: 09/17/2017   DOS: 09/23/2017   Date of Service: the patient was seen and examined on 09/23/2017  Subjective: Patient's pain is well controlled.  Still remains constipated.  No nausea no vomiting.  Complain about having muscle sprain in her neck and shoulder area  Brief hospital course: Pt. with PMH of vertigo; admitted on 09/17/2017, presented with complaint of back pain, was found to have L3 pathological fracture secondary to metastatic disease with epidural tumor.  Underwent surgical biopsy and after consultation with radiation oncology patient was transferred to [facility] for radiation. Currently further plan is follow-up on recommendation from radiation oncology.  Assessment and Plan: 1.  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 L3 pathological fracture. IR guided CT core biopsy of the L3 lesion. Surgical pathology suggest poorly differentiated adenocarcinoma with lung primary. The abdomen pelvis chest shows multiple bilateral pulmonary nodules with possibly primary lung malignancy on the right upper lobe. Also possibility of primary colorectal neoplasm in the cecum. Radiation oncology was consulted for palliative radiotherapy to the lumbar spine.  oncology consulted, recommended outpatient establishing care. Currently performing prone position only due to severe back pain in any other position. Currently on pain regimen with IV Dilaudid, Decadron, Flexeril, Robaxin, Hoxie. We will try to minimize medication after receiving radiation. Patient complaining of having significant pain while wearing the brace.  Will relate to physical therapy.  2.  Constipation. Continue bowel regimen.  Diet: regular diet DVT Prophylaxis: subcutaneous Heparin  Advance goals of care discussion: full code  Family  Communication: no family was present at bedside, at the time of interview.  Disposition:  Discharge to be determined.  Consultants: Primary admission with neurosurgery, IR, and IM residency, radiation oncology Procedures: CT-guided core biopsy of the L3 lesion  Antibiotics: Anti-infectives (From admission, onward)   Start     Dose/Rate Route Frequency Ordered Stop   09/17/17 1800  cephALEXin (KEFLEX) capsule 500 mg  Status:  Discontinued     500 mg Oral 4 times daily 09/17/17 1609 09/17/17 1621       Objective: Physical Exam: Vitals:   09/22/17 1407 09/22/17 2044 09/23/17 0607 09/23/17 1255  BP: (!) 146/65 (!) 143/69 118/64 122/61  Pulse: 71 63 (!) 57 69  Resp:  16 20   Temp: 98.4 F (36.9 C) 98.1 F (36.7 C) 97.9 F (36.6 C) 98.3 F (36.8 C)  TempSrc: Oral Oral Oral Oral  SpO2: 93% 93% 96% 94%  Weight:      Height:        Intake/Output Summary (Last 24 hours) at 09/23/2017 1735 Last data filed at 09/23/2017 8315 Gross per 24 hour  Intake 240 ml  Output 1550 ml  Net -1310 ml   Filed Weights   09/21/17 2040  Weight: 68 kg (150 lb)   General: Alert, Awake and Oriented to Time, Place and Person. Appear in mild distress, affect appropriate Eyes: PERRL, Conjunctiva normal ENT: ral Mucosa clear moist Neck: difficult to assess JVD, no Abnormal Mass Or lumps Cardiovascular: S1 and S2 Present, no Murmur, Peripheral Pulses Present Respiratory: normal respiratory effort, Bilateral Air entry equal and Decreased, no use of accessory muscle, Clear to Auscultation, no Crackles, no wheezes Abdomen: Bowel Sound present, Soft and no tenderness, no hernia Skin: no redness, no Rash, no induration Extremities: no Pedal edema, no calf tenderness Neurologic: Grossly  no focal neuro deficit. Bilaterally Equal motor strength  Data Reviewed: CBC: Recent Labs  Lab 09/17/17 1928 09/20/17 0409 09/23/17 0833  WBC 14.3* 13.4* 19.8*  NEUTROABS  --   --  17.6*  HGB 15.8* 15.6* 16.9*  HCT  46.4* 46.8* 50.6*  MCV 88.9 90.0 89.9  PLT 257 304 643   Basic Metabolic Panel: Recent Labs  Lab 09/17/17 1928 09/20/17 0409 09/23/17 0833  NA 136 138 137  K 4.2 4.3 4.2  CL 102 103 101  CO2 24 26 26   GLUCOSE 140* 110* 164*  BUN 16 27* 26*  CREATININE 0.76 0.74 0.70  CALCIUM 9.3 9.1 9.4    Liver Function Tests: Recent Labs  Lab 09/17/17 1928 09/23/17 0833  AST 19 21  ALT 22 41  ALKPHOS 71 69  BILITOT 0.4 0.6  PROT 6.0* 6.0*  ALBUMIN 3.1* 3.0*   No results for input(s): LIPASE, AMYLASE in the last 168 hours. No results for input(s): AMMONIA in the last 168 hours. Coagulation Profile: Recent Labs  Lab 09/18/17 1450  INR 0.93   Cardiac Enzymes: No results for input(s): CKTOTAL, CKMB, CKMBINDEX, TROPONINI in the last 168 hours. BNP (last 3 results) No results for input(s): PROBNP in the last 8760 hours. CBG: Recent Labs  Lab 09/19/17 1650  GLUCAP 108*   Studies: No results found.  Scheduled Meds: . dexamethasone  6 mg Intravenous Q6H  . feeding supplement (ENSURE ENLIVE)  237 mL Oral BID BM  . methocarbamol  500 mg Oral BID  . nicotine  14 mg Transdermal Daily  . pantoprazole  80 mg Oral BID  . sodium chloride flush  3 mL Intravenous Q12H   Continuous Infusions: . sodium chloride     PRN Meds: acetaminophen **OR** acetaminophen, alum & mag hydroxide-simeth, cyclobenzaprine, HYDROmorphone (DILAUDID) injection, lactulose, meclizine, menthol-cetylpyridinium **OR** phenol, ondansetron **OR** ondansetron (ZOFRAN) IV, oxyCODONE, polyethylene glycol, senna-docusate, sodium chloride flush, traMADol  Time spent: 30 minutes  Author: Berle Mull, MD [facility] Pager: [phone] 09/23/2017 5:35 PM  If 7PM-7AM, please contact night-coverage at [email or web address], password [facility]

## 2017-09-23 NOTE — Progress Notes (Signed)
Inpatient Rehabilitation  Per MD request, patient was screened by Gunnar Fusi for appropriateness for an Inpatient Acute Rehab consult.  At this time there are no current therapy notes to review.  Will plan to follow at a distance until therapy assesses.  Call if questions.   Carmelia Roller., CCC/SLP Admission Coordinator  [facility]  Cell [phone]

## 2017-09-23 NOTE — Progress Notes (Signed)
The patient's case was reviewed at [facility] oncology conference and is getting ready to proceed with radiation. Her pathology from her lumbar spine biopsy indicates metastatic carcinoma, but histology has not been finalized. Her MRI is concerning of the Lumbar spine, and it was recommended that we document her Spinal Instability Neoplastic Score:   Spine Instability Neoplastic Score (SINS): SINS Component Description Score  Location Junctional (Occ-C2, C7-T2, T11-L1, L5-S1) Mobile (C3-6, L2-4) Semirigid (T3-10) Rigid (S2-5) 3 2 1  0  Pain Yes Occasional, non-mechanical No 3 1 0  Bone Lesion Lytic Mixed Blastic 2 1 0  Alignment Subluxation/Translation De Novo deformity Normal 4 2 0  Vertebral Body >50% collapse <50% collapse No collapse >50% VB involved None of above 3 2 1  0  Posterolateral Involvment Bilateral Unilateral 3 1   Tallied Score from 6 Components: Stable Potentially Unstable Unstable  0-6 7-12 13-18   SINS Score: 11   Fisher CG, et al. A novel classification system for spinal instability in neoplastic disease: an evidence-based approach and expert consensus from the Spine Oncology Study Group. Spine  95(09):T2671-2, 2010   I will reach out to Dr. Saintclair Halsted and make sure he is in agreement with our plans to proceed with radiotherapy.    Carola Rhine, PAC

## 2017-09-24 ENCOUNTER — Ambulatory Visit
Admit: 2017-09-24 | Discharge: 2017-09-24 | Disposition: A | Discharge status: Home / Self Care | Payer: Medicare Other | Attending: Radiation Oncology | Admitting: Radiation Oncology

## 2017-09-24 ENCOUNTER — Encounter: Payer: Self-pay | Admitting: *Deleted

## 2017-09-24 NOTE — Progress Notes (Signed)
Occupational Therapy Treatment Patient Details Name: Julia Nguyen MRN: [id number] DOB: [birth year] Today's Date: 09/24/2017    History of present illness pt is a 72 y/o female with no significant medical hx except vertigo, admitted under neruosurgery servica for management of L3 pathological fracture due to metastatic disease, MRI showing epidural tumor.  Work up to determine primary site for CA pending.   OT comments  Pt making small progress with adls at a bed level in prone.  Pt did come to EOB today in standing by coming off prone and slowly raising height of bed for pt to prop elbows on.  Pt with significant spasm in LLE during this transfer. Pt tolerated standing for appx 2 minutes before stating it was unbearable and getting back into bed prone. Talked to pt about having OT/PT come together to attempt to go from holding to bed in standing to a platform walker of some sort to attempt to make her mobile. Pt agreed 2 therapists would give her piece of mind to attempt this.  Pain is the biggest limiting factor with progress at this point.  Follow Up Recommendations  CIR    Equipment Recommendations  3 in 1 bedside commode    Recommendations for Other Services Rehab consult    Precautions / Restrictions Precautions Precautions: Back Required Braces or Orthoses: Spinal Brace Spinal Brace: Lumbar corset Restrictions Weight Bearing Restrictions: No Other Position/Activity Restrictions: pt unable to tolerate sitting or supine.  talked to pt at length about attempting to increase the length of time she attempts to stand next time and possibly attempt to side step down the bed.        Mobility Bed Mobility Overal bed mobility: Needs Assistance Bed Mobility: Rolling Rolling: Modified independent (Device/Increase time)         General bed mobility comments: Pt in prone position,  Requires min A to back off EOB and move into standing with +2 for safety.   Pt requires assist to lift LEs  back onto bed when returning to prone   Transfers Overall transfer level: Needs assistance   Transfers: Sit to/from Stand Sit to Stand: Min assist         General transfer comment: requires min A to move backwards into standing.  Attained full upright stance, but unable to talerate pain to try ambulation    Balance Overall balance assessment: Needs assistance     Sitting balance - Comments: unable attempt at this time   Standing balance support: Bilateral upper extremity supported Standing balance-Leahy Scale: Poor Standing balance comment: reliant on bil. UE support due to increased pain                            ADL either performed or assessed with clinical judgement   ADL Overall ADL's : Needs assistance/impaired Eating/Feeding: Set up;Bed level(prone in bed) Eating/Feeding Details (indicate cue type and reason): Pt has been eating prone propped on elbows.  Feel her elbows are red and irritated. Has pads on them but nursing notified. Grooming: Oral care;Wash/dry hands;Wash/dry face;Brushing hair;Set up;Bed level Grooming Details (indicate cue type and reason): prone in bed Upper Body Bathing: Moderate assistance   Lower Body Bathing: Maximal assistance   Upper Body Dressing : Moderate assistance Upper Body Dressing Details (indicate cue type and reason): pt is in a prone position on arrival. pt pushing herself up on elbos in a "plank" position to have the brace slid under her and  placed.  Lower Body Dressing: Total assistance;Sit to/from stand Lower Body Dressing Details (indicate cue type and reason): Pt unable to attempt due to severity of back pain    Toilet Transfer Details (indicate cue type and reason): pt currently with brief and purewick in place. pt reports having incontience of bladder/ bowel with the onset of this current medical dx. pt reports "before this i went to the toilet like everyeone else. I dont want to use these diaper. I am not choosing  to pee myself it just comes out and i can't help it" Pt also reports inability to tolerate sitting on commode prior to surgery due to back pain Toileting- Clothing Manipulation and Hygiene: Maximal assistance;Bed level       Functional mobility during ADLs: Minimal assistance General ADL Comments: limited to prone <>  standing only due to severity of pain      Vision   Vision Assessment?: No apparent visual deficits   Perception     Praxis      Cognition Arousal/Alertness: Awake/alert Behavior During Therapy: WFL for tasks assessed/performed Overall Cognitive Status: Within Functional Limits for tasks assessed                                 General Comments: Pt in a lot of pain but did convey that she would be more comfortable with two people if she were to attempt to walk.Taked to PT about a cotx next time and feel this pt needs a consistent therapist each time she is seen so she can trust that person with her pain and her mobility.        Exercises General Exercises - Lower Extremity Ankle Circles/Pumps: AROM;Both;10 reps(prone) Gluteal Sets: AROM;Both;5 reps(prone, with abdominal isometric as well)   Shoulder Instructions       General Comments Pt with very red elbows from propping onto them.    Pertinent Vitals/ Pain       Pain Assessment: 0-10 Pain Score: 10-Worst pain ever Pain Location: back radiating anteriorly and into thigh  Pain Descriptors / Indicators: Burning;Grimacing;Guarding;Restless;Spasm Pain Intervention(s): Limited activity within patient's tolerance;Monitored during session;Repositioned;Heat applied  Home Living                                          Prior Functioning/Environment              Frequency  Min 3X/week        Progress Toward Goals  OT Goals(current goals can now be found in the care plan section)  Progress towards OT goals: Progressing toward goals  Acute Rehab OT Goals Patient  Stated Goal: pt wants to get back to being active and being able to walk without pain OT Goal Formulation: With patient Time For Goal Achievement: 10/02/17 Potential to Achieve Goals: Good ADL Goals Pt Will Perform Lower Body Dressing: with min guard assist;sit to/from stand;with adaptive equipment Pt Will Transfer to Toilet: with min assist;ambulating;bedside commode Additional ADL Goal #1: pt will complete bed mobility supervision level as precursor for adls ( using proper log roll techniques)  Plan Discharge plan remains appropriate    Co-evaluation                 AM-PAC PT "6 Clicks" Daily Activity     Outcome Measure   Help from another person  eating meals?: A Little Help from another person taking care of personal grooming?: A Little Help from another person toileting, which includes using toliet, bedpan, or urinal?: Total Help from another person bathing (including washing, rinsing, drying)?: A Lot Help from another person to put on and taking off regular upper body clothing?: A Lot Help from another person to put on and taking off regular lower body clothing?: A Lot 6 Click Score: 13    End of Session Equipment Utilized During Treatment: Back brace  OT Visit Diagnosis: Unsteadiness on feet (R26.81);Muscle weakness (generalized) (M62.81)   Activity Tolerance Patient limited by pain   Patient Left in bed;with call bell/phone within reach   Nurse Communication Mobility status;Patient requests pain meds        Time: 1112-1140 OT Time Calculation (min): 28 min  Charges: OT General Charges $OT Visit: 1 Visit OT Treatments $Self Care/Home Management : 23-37 mins  Jinger Neighbors, OTR/L [phone]   Glenford Peers 09/24/2017, 12:02 PM

## 2017-09-24 NOTE — Progress Notes (Signed)
Nutrition Follow-up  INTERVENTION:   Continue Ensure Enlive po BID, each supplement provides 350 kcal and 20 grams of protein  NUTRITION DIAGNOSIS:   Inadequate oral intake related to poor appetite as evidenced by per patient/family report.  Ongoing.  GOAL:   Patient will meet greater than or equal to 90% of their needs  Progressing.  MONITOR:   PO intake, Supplement acceptance, Labs, Weight trends, I & O's, Skin  ASSESSMENT:   72 year old female with new diagnosis of probable metastatic cancer to L3.   Pt transferred from [facility] on 4/27 to begin radiation therapy. Pt has been consuming 50-100% of meals but states her meals have been "horrible". She states that all her food "tastes the same". Pt is drinking Ensure supplements, so will continue those.  At the end of visit, pt informed RD that she needed a nurse and that she was about to have a BM. Alerted RN for assistance.  Per chart review, pt's weight is stable.   Labs reviewed. Medications reviewed.  NUTRITION - FOCUSED PHYSICAL EXAM:  Unable to perform, given pt's position in bed. Pt also had to urgently move her bowels at end of visit.  Diet Order:   Diet Order           Diet regular Room service appropriate? Yes; Fluid consistency: Thin  Diet effective now          EDUCATION NEEDS:   Not appropriate for education at this time  Skin:  Skin Assessment: Skin Integrity Issues: Skin Integrity Issues:: Stage I Stage I: knees  Last BM:  4/23  Height:   Ht Readings from Last 1 Encounters:  09/21/17 5\' 5"  (1.651 m)    Weight:   Wt Readings from Last 1 Encounters:  09/21/17 150 lb (68 kg)    Ideal Body Weight:  56.8 kg  BMI:  Body mass index is 24.96 kg/m.  Estimated Nutritional Needs:   Kcal:  1800-2000  Protein:  80-90 grams  Fluid:  1.8 - 2 L/day  Clayton Bibles, MS, RD, LDN [facility] Pager: [phone] After Hours Pager: [phone]

## 2017-09-24 NOTE — Progress Notes (Signed)
[facility] On[facility] Dept Therapy Treatment Record Phone [phone]   Radiation Therapy was administered to [facility] Julia Nguyen on: 09/24/2017  2:58 PM and was treatment # 2 out of Julia planned course of 10 treatments.  Radiation Treatment  1). Beam photons with 6-10 energy  2). Brachytherapy None  3). Stereotactic Radiosurgery None  4). Other Radiation None     Maudie Shingledecker, RT (T)

## 2017-09-24 NOTE — Progress Notes (Signed)
[facility] Progress Note  Patient: Julia Nguyen YHC:[id number]   PCP: Patient, No Pcp Per DOB: Julia Nguyen 14, 1947   DOA: 09/17/2017   DOS: 09/24/2017   Date of Service: the patient was seen and examined on 09/24/2017  Subjective: Pain remains well controlled.  Tolerated first session of therapy.  No nausea or vomiting.  Finally had a BM today.  Brief hospital course: Pt. with PMH of vertigo; admitted on 09/17/2017, presented with complaint of back pain, was found to have L3 pathological fracture secondary to metastatic disease with epidural tumor.  Underwent surgical biopsy and after consultation with radiation oncology patient was transferred to [facility] for radiation. Currently further plan is monitor improvement of radiation therapy and pain control and  Assessment and Plan: 1.  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 L3 pathological fracture. IR guided CT core biopsy of the L3 lesion. Surgical pathology suggest poorly differentiated adenocarcinoma with lung primary. The abdomen pelvis chest shows multiple bilateral pulmonary nodules with possibly primary lung malignancy on the right upper lobe. Also possibility of primary colorectal neoplasm in the cecum. Radiation oncology was consulted for palliative radiotherapy to the lumbar spine. oncology consulted, D/W Dr. Julien Nordmann, recommended outpatient establishing care.  He will discuss with his navigator. Currently preferring prone position only due to severe back pain in any other position. Currently on pain regimen with IV Dilaudid, Decadron, Flexeril, Robaxin, OxyIR. We will try to minimize medication after receiving radiation.  2.  Constipation. Continue bowel regimen.  Appears to have improved.  3.  Leukocytosis. This is due to being on steroids.  No further work-up at present.  4.  2 cm rim-enhancing mass in proximal cecal wall concerning for a primary colon cancer, will  need work-up as an outpatient once better.  5.  2 cm lesion in left kidney, 12 mm lesion in the right kidney. MRI without and with contrast as an outpatient once stable from current pain  Diet: regular diet DVT Prophylaxis: subcutaneous Heparin  Advance goals of care discussion: full code  Family Communication: no family was present at bedside, at the time of interview.  Disposition:  Discharge to be determined.  Consultants: Primary admission with neurosurgery, IR, and IM residency, radiation oncology Procedures: CT-guided core biopsy of the L3 lesion  Antibiotics: Anti-infectives (From admission, onward)   Start     Dose/Rate Route Frequency Ordered Stop   09/17/17 1800  cephALEXin (KEFLEX) capsule 500 mg  Status:  Discontinued     500 mg Oral 4 times daily 09/17/17 1609 09/17/17 1621       Objective: Physical Exam: Vitals:   09/23/17 1255 09/23/17 2057 09/24/17 0540 09/24/17 1311  BP: 122/61 134/66 116/67 125/81  Pulse: 69 63 61 63  Resp:  18 20 14   Temp: 98.3 F (36.8 C) 98.5 F (36.9 C) 98.2 F (36.8 C) 97.7 F (36.5 C)  TempSrc: Oral Oral Oral Oral  SpO2: 94% 97% 97% 96%  Weight:      Height:        Intake/Output Summary (Last 24 hours) at 09/24/2017 1601 Last data filed at 09/24/2017 1309 Gross per 24 hour  Intake 300 ml  Output 950 ml  Net -650 ml   Filed Weights   09/21/17 2040  Weight: 68 kg (150 lb)   General: Alert, Awake and Oriented to Time, Place and Person. Appear in mild distress, affect appropriate Eyes: PERRL, Conjunctiva normal ENT: ral Mucosa clear moist Neck: difficult to assess  JVD, no Abnormal Mass Or lumps Cardiovascular: S1 and S2 Present, no Murmur, Peripheral Pulses Present Respiratory: normal respiratory effort, Bilateral Air entry equal and Decreased, no use of accessory muscle, Clear to Auscultation, no Crackles, no wheezes Abdomen: Bowel Sound present, Soft and no tenderness, no hernia Skin: no redness, no Rash, no  induration Extremities: no Pedal edema, no calf tenderness Neurologic: Grossly no focal neuro deficit. Bilaterally Equal motor strength  Data Reviewed: CBC: Recent Labs  Lab 09/17/17 1928 09/20/17 0409 09/23/17 0833  WBC 14.3* 13.4* 19.8*  NEUTROABS  --   --  17.6*  HGB 15.8* 15.6* 16.9*  HCT 46.4* 46.8* 50.6*  MCV 88.9 90.0 89.9  PLT 257 304 412   Basic Metabolic Panel: Recent Labs  Lab 09/17/17 1928 09/20/17 0409 09/23/17 0833  NA 136 138 137  K 4.2 4.3 4.2  CL 102 103 101  CO2 24 26 26   GLUCOSE 140* 110* 164*  BUN 16 27* 26*  CREATININE 0.76 0.74 0.70  CALCIUM 9.3 9.1 9.4    Liver Function Tests: Recent Labs  Lab 09/17/17 1928 09/23/17 0833  AST 19 21  ALT 22 41  ALKPHOS 71 69  BILITOT 0.4 0.6  PROT 6.0* 6.0*  ALBUMIN 3.1* 3.0*   No results for input(s): LIPASE, AMYLASE in the last 168 hours. No results for input(s): AMMONIA in the last 168 hours. Coagulation Profile: Recent Labs  Lab 09/18/17 1450  INR 0.93   Cardiac Enzymes: No results for input(s): CKTOTAL, CKMB, CKMBINDEX, TROPONINI in the last 168 hours. BNP (last 3 results) No results for input(s): PROBNP in the last 8760 hours. CBG: Recent Labs  Lab 09/19/17 1650  GLUCAP 108*   Studies: No results found.  Scheduled Meds: . dexamethasone  6 mg Intravenous Q6H  . feeding supplement (ENSURE ENLIVE)  237 mL Oral BID BM  . methocarbamol  500 mg Oral BID  . nicotine  14 mg Transdermal Daily  . pantoprazole  80 mg Oral BID  . sodium chloride flush  3 mL Intravenous Q12H   Continuous Infusions: . sodium chloride     PRN Meds: acetaminophen **OR** acetaminophen, alum & mag hydroxide-simeth, cyclobenzaprine, HYDROmorphone (DILAUDID) injection, meclizine, menthol-cetylpyridinium **OR** phenol, ondansetron **OR** ondansetron (ZOFRAN) IV, oxyCODONE, polyethylene glycol, senna-docusate, sodium chloride flush, traMADol  Time spent: 35 minutes  Author: Berle Mull, MD [facility] Pager: [phone] 09/24/2017 4:01 PM  If 7PM-7AM, please contact night-coverage at [email or web address], password [facility]

## 2017-09-24 NOTE — Progress Notes (Signed)
Physical Therapy Treatment Patient Details Name: Julia Nguyen MRN: [id number] DOB: May 13, 1946 Today's Date: 09/24/2017    History of Present Illness pt is a 72 y/o female with no significant medical hx except vertigo, admitted under neruosurgery servica for management of L3 pathological fracture due to metastatic disease, MRI showing epidural tumor.  Work up to determine primary site for CA pending.    PT Comments    Pt had just been up standing for 2 minutes with OT, she was not ready to attempt further mobility 2* pain. Instructed pt in LE and abdominal exercises for strengthening. Pt maintains prone position.   Follow Up Recommendations  Supervision/Assistance - 24 hour;CIR     Equipment Recommendations  Other (comment)(TBA)    Recommendations for Other Services       Precautions / Restrictions Precautions Precautions: Back Required Braces or Orthoses: Spinal Brace Spinal Brace: Lumbar corset Restrictions Weight Bearing Restrictions: No Other Position/Activity Restrictions: pt unable to tolerate sitting or supine.  talked to pt at length about attempting to increase the length of time she attempts to stand next time and possibly attempt to side step down the bed.     Mobility  Bed Mobility Overal bed mobility: Needs Assistance Bed Mobility: Rolling Rolling: Modified independent (Device/Increase time)         General bed mobility comments: pt had just been up out of bed with OT and was not ready to attempt standing again, instructed pt in independent exercises.   Transfers Overall transfer level: Needs assistance   Transfers: Sit to/from Stand Sit to Stand: Min assist         General transfer comment: requires min A to move backwards into standing.  Attained full upright stance, but unable to talerate pain to try ambulation  Ambulation/Gait                 Stairs             Wheelchair Mobility    Modified Rankin (Stroke Patients Only)        Balance Overall balance assessment: Needs assistance     Sitting balance - Comments: unable attempt at this time   Standing balance support: Bilateral upper extremity supported Standing balance-Leahy Scale: Poor Standing balance comment: reliant on bil. UE support due to increased pain                             Cognition Arousal/Alertness: Awake/alert Behavior During Therapy: WFL for tasks assessed/performed Overall Cognitive Status: Within Functional Limits for tasks assessed                                 General Comments: Pt in a lot of pain but did convey that she would be more comfortable with two people if she were to attempt to walk.Taked to PT about a cotx next time and feel this pt needs a consistent therapist each time she is seen so she can trust that person with her pain and her mobility.      Exercises General Exercises - Lower Extremity Ankle Circles/Pumps: AROM;Both;10 reps(prone) Gluteal Sets: AROM;Both;5 reps(prone, with abdominal isometric as well)    General Comments General comments (skin integrity, edema, etc.): Pt with very red elbows from propping onto them.      Pertinent Vitals/Pain Pain Assessment: 0-10 Pain Score: 10-Worst pain ever Pain Location: back radiating anteriorly and  into thigh  Pain Descriptors / Indicators: Burning;Grimacing;Guarding;Restless;Spasm Pain Intervention(s): Limited activity within patient's tolerance;Monitored during session;Repositioned;Heat applied    Home Living                      Prior Function            PT Goals (current goals can now be found in the care plan section) Acute Rehab PT Goals Patient Stated Goal: pt wants to get back to being active and being able to walk without pain PT Goal Formulation: With patient Time For Goal Achievement: 10/02/17 Potential to Achieve Goals: Good Progress towards PT goals: Not progressing toward goals - comment(pain limiting  progress)    Frequency    Min 3X/week      PT Plan Frequency needs to be updated    Co-evaluation              AM-PAC PT "6 Clicks" Daily Activity  Outcome Measure  Difficulty turning over in bed (including adjusting bedclothes, sheets and blankets)?: None Difficulty moving from lying on back to sitting on the side of the bed? : None Difficulty sitting down on and standing up from a chair with arms (e.g., wheelchair, bedside commode, etc,.)?: A Little Help needed moving to and from a bed to chair (including a wheelchair)?: A Little Help needed walking in hospital room?: A Lot Help needed climbing 3-5 steps with a railing? : A Lot 6 Click Score: 18    End of Session Equipment Utilized During Treatment: Back brace Activity Tolerance: Patient limited by pain Patient left: in bed;with call bell/phone within reach Nurse Communication: Mobility status PT Visit Diagnosis: Other abnormalities of gait and mobility (R26.89);Muscle weakness (generalized) (M62.81)     Time: 1610-9604 PT Time Calculation (min) (ACUTE ONLY): 16 min  Charges:  $Therapeutic Exercise: 8-22 mins                    G Codes:       Philomena Doheny 09/24/2017, 12:03 PM [phone]

## 2017-09-24 NOTE — Progress Notes (Signed)
Oncology Nurse Navigator Documentation  Oncology Nurse Navigator Flowsheets 09/24/2017  Navigator Location [facility]  Navigator Encounter Type Other/per Dr. Julien Nordmann, I requested foundation one and PDL 1 from pathology dept  Barriers/Navigation Needs Coordination of Care  Interventions Coordination of Care;Other  Coordination of Care Other  Acuity Level 2  Time Spent with Patient 15

## 2017-09-25 ENCOUNTER — Ambulatory Visit
Admission: RE | Admit: 2017-09-25 | Discharge: 2017-09-25 | Disposition: A | Discharge status: Home / Self Care | Payer: Medicare Other | Source: Ambulatory Visit | Attending: Radiation Oncology | Admitting: Radiation Oncology

## 2017-09-25 DIAGNOSIS — C3491 Malignant neoplasm of unspecified part of right bronchus or lung: Secondary | ICD-10-CM | POA: Insufficient documentation

## 2017-09-25 DIAGNOSIS — C7952 Secondary malignant neoplasm of bone marrow: Secondary | ICD-10-CM

## 2017-09-25 DIAGNOSIS — C7951 Secondary malignant neoplasm of bone: Secondary | ICD-10-CM

## 2017-09-25 DIAGNOSIS — D72829 Elevated white blood cell count, unspecified: Secondary | ICD-10-CM

## 2017-09-25 DIAGNOSIS — S32001D Stable burst fracture of unspecified lumbar vertebra, subsequent encounter for fracture with routine healing: Secondary | ICD-10-CM

## 2017-09-25 DIAGNOSIS — Z510 Encounter for antineoplastic radiation therapy: Secondary | ICD-10-CM | POA: Insufficient documentation

## 2017-09-25 DIAGNOSIS — K5909 Other constipation: Secondary | ICD-10-CM

## 2017-09-25 DIAGNOSIS — K6389 Other specified diseases of intestine: Secondary | ICD-10-CM

## 2017-09-25 DIAGNOSIS — M8448XA Pathological fracture, other site, initial encounter for fracture: Secondary | ICD-10-CM | POA: Diagnosis present

## 2017-09-25 DIAGNOSIS — N289 Disorder of kidney and ureter, unspecified: Secondary | ICD-10-CM

## 2017-09-25 LAB — BASIC METABOLIC PANEL
Anion gap: 14 (ref 5–15)
BUN: 30 mg/dL — AB (ref 6–20)
CALCIUM: 9.5 mg/dL (ref 8.9–10.3)
CO2: 22 mmol/L (ref 22–32)
CREATININE: 0.66 mg/dL (ref 0.44–1.00)
Chloride: 103 mmol/L (ref 101–111)
GFR calc Af Amer: >60 mL/min (ref >60)
GFR calc non Af Amer: >60 mL/min (ref >60)
GLUCOSE: 151 mg/dL — AB (ref 65–99)
Potassium: 4.5 mmol/L (ref 3.5–5.1)
Sodium: 139 mmol/L (ref 135–145)

## 2017-09-25 LAB — CBC WITH DIFFERENTIAL/PLATELET
BASOS PCT: 0 %
Basophils Absolute: 0 10*3/uL (ref 0.0–0.1)
EOS ABS: 0 10*3/uL (ref 0.0–0.7)
Eosinophils Relative: 0 %
HCT: 53.3 % — ABNORMAL HIGH (ref 36.0–46.0)
Hemoglobin: 17.9 g/dL — ABNORMAL HIGH (ref 12.0–15.0)
Lymphocytes Relative: 5 %
Lymphs Abs: 1 10*3/uL (ref 0.7–4.0)
MCH: 30.3 pg (ref 26.0–34.0)
MCHC: 33.6 g/dL (ref 30.0–36.0)
MCV: 90.2 fL (ref 78.0–100.0)
MONO ABS: 0.6 10*3/uL (ref 0.1–1.0)
Monocytes Relative: 3 %
Neutro Abs: 17.8 10*3/uL — ABNORMAL HIGH (ref 1.7–7.7)
Neutrophils Relative %: 92 %
Platelets: 290 10*3/uL (ref 150–400)
RBC: 5.91 MIL/uL — ABNORMAL HIGH (ref 3.87–5.11)
RDW: 12.7 % (ref 11.5–15.5)
WBC: 19.4 10*3/uL — ABNORMAL HIGH (ref 4.0–10.5)

## 2017-09-25 LAB — PROTEIN ELECTROPHORESIS, SERUM
A/G Ratio: 1 (ref 0.7–1.7)
Albumin ELP: 2.7 g/dL — ABNORMAL LOW (ref 2.9–4.4)
Alpha-1-Globulin: 0.2 g/dL (ref 0.0–0.4)
Alpha-2-Globulin: 0.9 g/dL (ref 0.4–1.0)
Beta Globulin: 0.8 g/dL (ref 0.7–1.3)
GLOBULIN, TOTAL: 2.6 g/dL (ref 2.2–3.9)
Gamma Globulin: 0.6 g/dL (ref 0.4–1.8)
TOTAL PROTEIN ELP: 5.3 g/dL — AB (ref 6.0–8.5)

## 2017-09-25 LAB — KAPPA/LAMBDA LIGHT CHAINS
KAPPA FREE LGHT CHN: 6.5 mg/L (ref 3.3–19.4)
Kappa, lambda light chain ratio: 1.05 (ref 0.26–1.65)
LAMDA FREE LIGHT CHAINS: 6.2 mg/L (ref 5.7–26.3)

## 2017-09-25 LAB — MAGNESIUM: MAGNESIUM: 2.4 mg/dL (ref 1.7–2.4)

## 2017-09-25 MED ORDER — SENNOSIDES-DOCUSATE SODIUM 8.6-50 MG PO TABS
1.0000 | ORAL_TABLET | Freq: Two times a day (BID) | ORAL | Status: DC
  Administered 2017-09-25 – 2017-09-30 (×8): 1 via ORAL
  Filled 2017-09-25 (×9): qty 1

## 2017-09-25 NOTE — Progress Notes (Signed)
Inpatient Rehabilitation  Met with patient at bedside to discuss team's recommendation for IP Rehab.  Shared booklets, reviewed insurance, and answered initial questions.  Patient states that her goals are to go home and stand and walk again.  Attempted to discuss anticipated need for 24/7 assist and if ramping entrance was a possibility; however, patient stated that she would be able to walk up the stairs without a problem.  Discussed her need to increase therapy tolerance with pain management in order to be able to participate in our 3 hours of therapy 5 days of the week.  Patient states that her first priority is completing her radiation treatments and is then unsure of the plan from there.  Will continue to follow along for timing of medical readiness, therapy tolerance, and discharge plans for potential IP Rehab admission.  Call if questions.    Melissa Bowie, M.A., CCC/SLP Admission Coordinator  [facility]  Cell [phone]  

## 2017-09-25 NOTE — Progress Notes (Signed)
Physical Therapy Treatment Patient Details Name: Julia Nguyen MRN: [id number] DOB: Jul 01, 1945 Today's Date: 09/25/2017    History of Present Illness pt is a 72 y/o female with no significant medical hx except vertigo, admitted under neruosurgery servica for management of L3 pathological fracture due to metastatic disease, MRI showing epidural tumor.  Work up to determine primary site for CA pending.    PT Comments    Pt very motivated and progressing but slowly 2* pain with OOB activity.  Pt would greatly benefit from follow up rehab at CIR level to maximize IND and safety prior to return home.   Follow Up Recommendations  CIR     Equipment Recommendations       Recommendations for Other Services OT consult     Precautions / Restrictions Precautions Precautions: Back Required Braces or Orthoses: Spinal Brace Restrictions Weight Bearing Restrictions: No Other Position/Activity Restrictions: Pt unable to tolerate sitting or standing and backs out of bed in prone.  Pt tolerated ltd walking in place but unable to attempt actual steps    Mobility  Bed Mobility Overal bed mobility: Needs Assistance Bed Mobility: Rolling Rolling: Modified independent (Device/Increase time)         General bed mobility comments: rolling to assist CNA with peerwick.  Pt unwilling to attempt rolling OOB as it would require sitting position  Transfers Overall transfer level: Needs assistance Equipment used: Rolling walker (2 wheeled) Transfers: Sit to/from Stand Sit to Stand: Min assist;+2 safety/equipment         General transfer comment: requires min A to move backwards into standing.  Attained full upright stance, but unable to talerate pain to try ambulation  Ambulation/Gait Ambulation/Gait assistance: Min guard;+2 safety/equipment           General Gait Details: Pt stood only and with ltd stepping in place.  Pt stood 3x for 15, 40 and 110 seconds before c/o increasing pressure and  cramping   Stairs             Wheelchair Mobility    Modified Rankin (Stroke Patients Only)       Balance Overall balance assessment: Needs assistance     Sitting balance - Comments: unable attempt at this time   Standing balance support: Bilateral upper extremity supported Standing balance-Leahy Scale: Poor Standing balance comment: reliant on bil. UE support due to increased pain                             Cognition Arousal/Alertness: Awake/alert Behavior During Therapy: WFL for tasks assessed/performed Overall Cognitive Status: Within Functional Limits for tasks assessed                                        Exercises General Exercises - Lower Extremity Ankle Circles/Pumps: AROM;Both;15 reps    General Comments        Pertinent Vitals/Pain Pain Assessment: 0-10 Pain Score: 8  Pain Location: back radiating anteriorly and into thigh L>R Pain Descriptors / Indicators: Burning;Grimacing;Guarding;Restless;Spasm Pain Intervention(s): Limited activity within patient's tolerance;Monitored during session;Premedicated before session;Heat applied    Home Living                      Prior Function            PT Goals (current goals can now be found in the  care plan section) Acute Rehab PT Goals Patient Stated Goal: pt wants to get back to being active and being able to walk without pain PT Goal Formulation: With patient Time For Goal Achievement: 10/02/17 Potential to Achieve Goals: Good Progress towards PT goals: Progressing toward goals    Frequency    Min 3X/week      PT Plan Current plan remains appropriate    Co-evaluation              AM-PAC PT "6 Clicks" Daily Activity  Outcome Measure  Difficulty turning over in bed (including adjusting bedclothes, sheets and blankets)?: None Difficulty moving from lying on back to sitting on the side of the bed? : Unable Difficulty sitting down on and  standing up from a chair with arms (e.g., wheelchair, bedside commode, etc,.)?: Unable Help needed moving to and from a bed to chair (including a wheelchair)?: A Little Help needed walking in hospital room?: A Lot Help needed climbing 3-5 steps with a railing? : A Lot 6 Click Score: 13    End of Session Equipment Utilized During Treatment: Back brace Activity Tolerance: Patient limited by pain Patient left: in bed;with call bell/phone within reach Nurse Communication: Mobility status PT Visit Diagnosis: Other abnormalities of gait and mobility (R26.89);Muscle weakness (generalized) (M62.81)     Time: 2637-8588 PT Time Calculation (min) (ACUTE ONLY): 36 min  Charges:  $Therapeutic Activity: 23-37 mins                    G Codes:       Pg [phone]    Vitaliy Eisenhour 09/25/2017, 12:38 PM

## 2017-09-25 NOTE — Progress Notes (Signed)
PROGRESS NOTE    Julia Nguyen  YIF:[id number] DOB: 05-30-1945 DOA: 09/17/2017 PCP: Patient, No Pcp Per   Brief Narrative:  Pt. with PMH of vertigo; admitted on 09/17/2017, presented with complaint of back pain, was found to have L3 pathological fracture secondary to metastatic disease with epidural tumor.  Underwent surgical biopsy and after consultation with radiation oncology patient was transferred to [facility] for radiation. Currently further plan is monitor improvement of radiation therapy and pain control and     Assessment & Plan:   Principal Problem:   Pathological fracture of lumbar vertebra Active Problems:   Metastatic cancer (HCC)   Lumbar burst fracture (HCC)   Pressure injury of skin   Constipation   Leukocytosis   Colonic mass   Kidney lesion  1.  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 L3 pathological fracture. S/p IR guided CT core biopsy of the L3 lesion. Surgical pathology consistent with poorly differentiated adenocarcinoma with lung primary. CT chest, abdomen and pelvis showing multiple bilateral pulmonary nodules concerning for possibly primary lung malignancy in the right upper lobe.  Also concern for possible colorectal mass in the cecum.  Patient has been consulted by radiation oncology and patient has been started on palliative radiotherapy to the lumbar spine with some clinical improvement.  Dr. Posey Pronto discussed findings on CT chest abdomen and pelvis as well as CT core biopsy findings with Dr. [name] of oncology who recommended outpatient establishing care.  He will discuss with his navigator. Currently  patient laying mostly in the prone position due to his severe back pain.  Continue current pain regimen with Robaxin, IV Dilaudid as needed, Flexeril as needed, OxyIR as needed.  Continue IV Decadron.  Will likely need to transition to oral Decadron in the next few days if continued  improvement.   2.  Constipation. Improved with laxative.  Senokot-S twice daily.  Place on MiraLAX daily.  Follow.   3.  Leukocytosis. Secondary to steroids.  Patient afebrile.  No signs or symptoms of infection.  Patient without any urinary symptoms.  Patient with no respiratory symptoms.  No need for antibiotics at this time.   4.  2 cm rim-enhancing mass in proximal cecal wall concerning for a primary colon cancer, Will need outpatient follow-up with oncology in the outpatient setting and possibly GI evaluation in the outpatient setting.   5.  2 cm lesion in left kidney, 12 mm lesion in the right kidney. MRI without and with contrast as an outpatient once stable from current pain and per outpatient follow-up with oncology.     DVT prophylaxis: SCDs Code Status: Full Family Communication: Updated patient.  No family at bedside. Disposition Plan: To be determined.  CIR hopefully.   Consultants:   Radiation oncology: [name], PA 09/19/2017  Interventional radiology: Dr. [name] 09/18/2017  Neurosurgery  Internal medicine residency    Procedures:   CT-guided core biopsy of left L3 paravertebral mass per Dr. Vernard Gambles 09/20/2017  CT chest/CT abdomen and pelvis 09/17/2017    Antimicrobials:   None   Subjective: Patient lying in the prone position due to significant back pain.  Denies any chest pain no shortness of breath.  Patient stated had bowel movement yesterday.  Patient states lower back pain improving on pain management and starting radiation treatments.  Objective: Vitals:   09/24/17 1311 09/24/17 2148 09/25/17 0453 09/25/17 1744  BP: 125/81 134/75 (!) 141/71 (!) 136/56  Pulse: 63 62 61 65  Resp: 14  16 20   Temp: 97.7 F (36.5 C) 97.7 F (36.5 C)  98.4 F (36.9 C)  TempSrc: Oral Oral Oral Oral  SpO2: 96% 95% 94% 96%  Weight:      Height:        Intake/Output Summary (Last 24 hours) at 09/25/2017 1815 Last data filed at 09/25/2017 1038 Gross per  24 hour  Intake 310 ml  Output 400 ml  Net -90 ml   Filed Weights   09/21/17 2040  Weight: 68 kg (150 lb)    Examination:  General exam: Appears calm and comfortable  Respiratory system: Clear to auscultation. Respiratory effort normal. Cardiovascular system: S1 & S2 heard, RRR. No JVD, murmurs, rubs, gallops or clicks. No pedal edema. Gastrointestinal system: Abdomen is nondistended, soft and nontender. No organomegaly or masses felt. Normal bowel sounds heard. Central nervous system: Alert and oriented. No focal neurological deficits. Extremities: Symmetric 5 x 5 power. Skin: No rashes, lesions or ulcers Psychiatry: Judgement and insight appear normal. Mood & affect appropriate.     Data Reviewed: I have personally reviewed following labs and imaging studies  CBC: Recent Labs  Lab 09/20/17 0409 09/23/17 0833 09/25/17 0927  WBC 13.4* 19.8* 19.4*  NEUTROABS  --  17.6* 17.8*  HGB 15.6* 16.9* 17.9*  HCT 46.8* 50.6* 53.3*  MCV 90.0 89.9 90.2  PLT 304 305 710   Basic Metabolic Panel: Recent Labs  Lab 09/20/17 0409 09/23/17 0833 09/25/17 0927  NA 138 137 139  K 4.3 4.2 4.5  CL 103 101 103  CO2 26 26 22   GLUCOSE 110* 164* 151*  BUN 27* 26* 30*  CREATININE 0.74 0.70 0.66  CALCIUM 9.1 9.4 9.5  MG  --   --  2.4   GFR: Estimated Creatinine Clearance: 57.2 mL/min (by C-G formula based on SCr of 0.66 mg/dL). Liver Function Tests: Recent Labs  Lab 09/23/17 0833  AST 21  ALT 41  ALKPHOS 69  BILITOT 0.6  PROT 6.0*  ALBUMIN 3.0*   No results for input(s): LIPASE, AMYLASE in the last 168 hours. No results for input(s): AMMONIA in the last 168 hours. Coagulation Profile: No results for input(s): INR, PROTIME in the last 168 hours. Cardiac Enzymes: No results for input(s): CKTOTAL, CKMB, CKMBINDEX, TROPONINI in the last 168 hours. BNP (last 3 results) No results for input(s): PROBNP in the last 8760 hours. HbA1C: No results for input(s): HGBA1C in the last 72  hours. CBG: Recent Labs  Lab 09/19/17 1650  GLUCAP 108*   Lipid Profile: No results for input(s): CHOL, HDL, LDLCALC, TRIG, CHOLHDL, LDLDIRECT in the last 72 hours. Thyroid Function Tests: No results for input(s): TSH, T4TOTAL, FREET4, T3FREE, THYROIDAB in the last 72 hours. Anemia Panel: No results for input(s): VITAMINB12, FOLATE, FERRITIN, TIBC, IRON, RETICCTPCT in the last 72 hours. Sepsis Labs: No results for input(s): PROCALCITON, LATICACIDVEN in the last 168 hours.  Recent Results (from the past 240 hour(s))  MRSA PCR Screening     Status: None   Collection Time: 09/17/17  4:10 PM  Result Value Ref Range Status   MRSA by PCR NEGATIVE NEGATIVE Final    Comment:        The GeneXpert MRSA Assay (FDA approved for NASAL specimens only), is one component of a comprehensive MRSA colonization surveillance program. It is not intended to diagnose MRSA infection nor to guide or monitor treatment for MRSA infections. Performed at [facility], [place] [address] [place], [place] [address]  Radiology Studies: No results found.      Scheduled Meds: . dexamethasone  6 mg Intravenous Q6H  . feeding supplement (ENSURE ENLIVE)  237 mL Oral BID BM  . methocarbamol  500 mg Oral BID  . nicotine  14 mg Transdermal Daily  . pantoprazole  80 mg Oral BID  . senna-docusate  1 tablet Oral BID  . sodium chloride flush  3 mL Intravenous Q12H   Continuous Infusions: . sodium chloride       LOS: 8 days    Time spent: 35 minutes    Irine Seal, MD [facility] Pager [phone]  If 7PM-7AM, please contact night-coverage [email or web address] Password [facility] 09/25/2017, 6:15 PM

## 2017-09-26 ENCOUNTER — Ambulatory Visit
Admit: 2017-09-26 | Discharge: 2017-09-26 | Disposition: A | Discharge status: Home / Self Care | Payer: Medicare Other | Attending: Radiation Oncology | Admitting: Radiation Oncology

## 2017-09-26 DIAGNOSIS — C3491 Malignant neoplasm of unspecified part of right bronchus or lung: Secondary | ICD-10-CM

## 2017-09-26 DIAGNOSIS — C7951 Secondary malignant neoplasm of bone: Secondary | ICD-10-CM

## 2017-09-26 DIAGNOSIS — K639 Disease of intestine, unspecified: Secondary | ICD-10-CM

## 2017-09-26 LAB — BASIC METABOLIC PANEL
Anion gap: 9 (ref 5–15)
BUN: 31 mg/dL — ABNORMAL HIGH (ref 6–20)
CO2: 25 mmol/L (ref 22–32)
CREATININE: 0.6 mg/dL (ref 0.44–1.00)
Calcium: 9.4 mg/dL (ref 8.9–10.3)
Chloride: 106 mmol/L (ref 101–111)
GFR calc Af Amer: >60 mL/min (ref >60)
Glucose, Bld: 111 mg/dL — ABNORMAL HIGH (ref 65–99)
Potassium: 4.5 mmol/L (ref 3.5–5.1)
SODIUM: 140 mmol/L (ref 135–145)

## 2017-09-26 LAB — CBC
HCT: 48.2 % — ABNORMAL HIGH (ref 36.0–46.0)
Hemoglobin: 16.5 g/dL — ABNORMAL HIGH (ref 12.0–15.0)
MCH: 30.4 pg (ref 26.0–34.0)
MCHC: 34.2 g/dL (ref 30.0–36.0)
MCV: 88.9 fL (ref 78.0–100.0)
PLATELETS: 285 10*3/uL (ref 150–400)
RBC: 5.42 MIL/uL — ABNORMAL HIGH (ref 3.87–5.11)
RDW: 12.6 % (ref 11.5–15.5)
WBC: 18 10*3/uL — ABNORMAL HIGH (ref 4.0–10.5)

## 2017-09-26 NOTE — NC FL2 (Addendum)
[place] LEVEL OF CARE SCREENING TOOL     IDENTIFICATION  Patient Name: Julia Nguyen Birthdate: 1946-05-07 Sex: female Admission Date (Current Location): 09/17/2017  [facility] and [place] Number:  Herbalist and Address:  [facility],  [place], [place]      Provider Number: [id number]  Attending Physician Name and Address:  Eugenie Filler, MD  Relative Name and Phone Number:       Current Level of Care: Hospital Recommended Level of Care: [place] Prior Approval Number:    Date Approved/Denied:   PASRR Number: [id number]  Discharge Plan: SNF    Current Diagnoses: Patient Active Problem List   Diagnosis Date Noted  . Pathological fracture of lumbar vertebra 09/25/2017  . Metastatic cancer ([place])   . Constipation   . Leukocytosis   . Colonic mass   . Kidney lesion   . Pressure injury of skin 09/18/2017  . Lumbar burst fracture ([place]) 09/17/2017    Orientation RESPIRATION BLADDER Height & Weight     Self, Time, Situation, Place  Normal Continent Weight: 150 lb (68 kg) Height:  5\' 5"  (165.1 cm)  BEHAVIORAL SYMPTOMS/MOOD NEUROLOGICAL BOWEL NUTRITION STATUS      Continent Diet(regular diet)  AMBULATORY STATUS COMMUNICATION OF NEEDS Skin    Extensive Assist Verbally PU Stage and Appropriate Care     Stage 1 pressure injury abdomen, foam dressings changes PRN  Stage 1 pressure injury knee, foam dressing changes PRN  Closed incision lumbar, adhesive bandage                      Personal Care Assistance Level of Assistance  Bathing, Feeding, Dressing Bathing Assistance: Limited assistance Feeding assistance: Independent Dressing Assistance: Limited assistance     Functional Limitations Info  Sight, Hearing, Speech Sight Info: Adequate Hearing Info: Adequate Speech Info: Adequate    SPECIAL CARE FACTORS FREQUENCY  PT (By licensed PT), OT (By licensed OT)     PT Frequency:  5x OT Frequency: 5x            Contractures Contractures Info: Not present    Additional Factors Info  Code Status, Allergies Code Status Info: full code Allergies Info: nka           Current Medications (09/26/2017):  This is the current hospital active medication list Current Facility-Administered Medications  Medication Dose Route Frequency Provider Last Rate Last Dose  . 0.9 %  sodium chloride infusion  250 mL Intravenous Continuous Katherine Roan, MD      . acetaminophen (TYLENOL) tablet 650 mg  650 mg Oral Q4H PRN Katherine Roan, MD       Or  . acetaminophen (TYLENOL) suppository 650 mg  650 mg Rectal Q4H PRN Katherine Roan, MD      . alum & mag hydroxide-simeth (MAALOX/MYLANTA) 200-200-20 MG/5ML suspension 30 mL  30 mL Oral Q6H PRN Katherine Roan, MD      . cyclobenzaprine (FLEXERIL) tablet 10 mg  10 mg Oral TID PRN Katherine Roan, MD   10 mg at 09/25/17 1835  . dexamethasone (DECADRON) injection 6 mg  6 mg Intravenous Q6H Katherine Roan, MD   6 mg at 09/26/17 4193  . feeding supplement (ENSURE ENLIVE) (ENSURE ENLIVE) liquid 237 mL  237 mL Oral BID BM Katherine Roan, MD   237 mL at 09/25/17 1015  . HYDROmorphone (DILAUDID) injection 1 mg  1 mg  Intravenous Q2H PRN Katherine Roan, MD   1 mg at 09/20/17 0300  . meclizine (ANTIVERT) tablet 25 mg  25 mg Oral PRN Katherine Roan, MD      . menthol-cetylpyridinium (CEPACOL) lozenge 3 mg  1 lozenge Oral PRN Katherine Roan, MD       Or  . phenol (CHLORASEPTIC) mouth spray 1 spray  1 spray Mouth/Throat PRN Katherine Roan, MD      . methocarbamol (ROBAXIN) tablet 500 mg  500 mg Oral BID Katherine Roan, MD   500 mg at 09/25/17 2143  . nicotine (NICODERM CQ - dosed in mg/24 hours) patch 14 mg  14 mg Transdermal Daily Katherine Roan, MD   14 mg at 09/25/17 1015  . ondansetron (ZOFRAN) tablet 4 mg  4 mg Oral Q6H PRN Katherine Roan, MD       Or  . ondansetron (ZOFRAN) injection 4 mg   4 mg Intravenous Q6H PRN Katherine Roan, MD      . oxyCODONE (Oxy IR/ROXICODONE) immediate release tablet 15 mg  15 mg Oral Q3H PRN Katherine Roan, MD   15 mg at 09/23/17 8675  . pantoprazole (PROTONIX) EC tablet 80 mg  80 mg Oral BID Katherine Roan, MD   80 mg at 09/25/17 2143  . polyethylene glycol (MIRALAX / GLYCOLAX) packet 17 g  17 g Oral Daily PRN Katherine Roan, MD   17 g at 09/23/17 1749  . senna-docusate (Senokot-S) tablet 1 tablet  1 tablet Oral BID Eugenie Filler, MD   1 tablet at 09/25/17 2143  . sodium chloride flush (NS) 0.9 % injection 3 mL  3 mL Intravenous Q12H Katherine Roan, MD   3 mL at 09/25/17 2144  . sodium chloride flush (NS) 0.9 % injection 3 mL  3 mL Intravenous PRN Katherine Roan, MD      . traMADol Veatrice Bourbon) tablet 50 mg  50 mg Oral Q6H PRN Katherine Roan, MD   50 mg at 09/24/17 2122     Discharge Medications: Please see discharge summary for a list of discharge medications.  Relevant Imaging Results:  Relevant Lab Results:   Additional Information SS# [id number]. Radiation appointments at [facility] until 10/04/17  Nila Nephew, LCSW

## 2017-09-26 NOTE — Clinical Social Work Note (Signed)
Clinical Social Work Assessment  Patient Details  Name: Julia Nguyen Roderick MRN: [id number] Date of Birth: [birth year]  Date of referral:  09/26/17               Reason for consult:  Discharge Planning(no formal consult)                Permission sought to share information with:    Permission granted to share information::     Name::        Agency::     Relationship::     Contact Information:     Housing/Transportation Living arrangements for the past 2 months:  Single Family Home Source of Information:  Patient, Medical Team Patient Interpreter Needed:  None Criminal Activity/Legal Involvement Pertinent to Current Situation/Hospitalization:  No - Comment as needed Significant Relationships:  Spouse Lives with:  Spouse Do you feel safe going back to the place where you live?  Yes Need for family participation in patient care:  No (Coment)  Care giving concerns:  Pt admitted from home where she resides with her husband. Reports lately at home due to pain she has not been able to ambulate much and has primarily been lying on her stomach.  Currently inpatient for treatment of metastatic cancer- has pathological lumbar fracture, also has wounds/wound care. Receiving radiation treatments and reports her primary focus is to "find out the plan for my cancer treatment."   Social Worker assessment / plan:  CSW met with pt at bedside to discuss disposition plan. Pt reports her goal at DC is "to be able to walk again. I'd like to go home but my husband is 37 years old and works so he isn't able to give all the help I would need." Pt reports if her pain lessens she feels she could walk/participate more with therapy. CIR following and pt agreeable to CIR admission of appropriate. CSW discussed with her possible back up plans- pt reports she is highly hesitant to agree to go to SNF "because I really want to be able to be at home and I'm afraid if I get to a nursing home I'll never get out." CSW validated  her fear and processed with her that goals of SNF or other rehab placement would be short term to help her gain the ambulation ability to DC to next venue. Pt agreeable. Obtained Pas[place] and completed FL2 - referrals to area SNFs made. Pt states she wants to understand more about what her cancer treatment /prognosis [place] entail before making final disposition decision. CSW will follow to assist as needed.  Plan: TBD- potential for CIR versus SNF. If much improved with independence prior to DC would prefer home.   Employment status:  Retired Health visitor PT Recommendations:  Inpatient [place] / Referral to community resources:  [place]  Patient/Family's Response to care:  Pt reports she is grateful for care but frustrated in feeling that her potential treatment plans are not being discussed clearly with her  Patient/Family's Understanding of and Emotional Response to Diagnosis, Current Treatment, and Prognosis:  Pt shows understanding of her current treatment but, as noted above, states she still feels she does not understand where her treatment is headed and "what we are still testing and looking into in order to make treatment decisions." Pt states she feels comfortable advocating for herself as a patient and trusts providers but wants candid conversations. Emotionally pt admits to being very frustrated. States she wants to be  able to walk again and needs to understand if this will be possible  Emotional Assessment Appearance:  Appears stated age Attitude/Demeanor/Rapport:  Engaged(frustrated) Affect (typically observed):  Irritable, Adaptable Orientation:  Oriented to Self, Oriented to Place, Oriented to  Time, Oriented to Situation Alcohol / Substance use:  Not Applicable Psych involvement (Current and /or in the community):  No (Comment)  Discharge Needs  Concerns to be addressed:  Decision making concerns, Discharge Planning  Concerns Readmission within the last 30 days:  No Current discharge risk:  Dependent with Mobility Barriers to Discharge:  Continued Medical Work up   Meghan R Stout, LCSW 09/26/2017, 10:26 AM  [phone]  

## 2017-09-26 NOTE — Progress Notes (Signed)
Physical Therapy Treatment Patient Details Name: Julia Nguyen MRN: [id number] DOB: 05/02/1946 Today's Date: 09/26/2017    History of Present Illness pt is a 72 y/o female with no significant medical hx except vertigo, admitted under neruosurgery servica for management of L3 pathological fracture due to metastatic disease, MRI showing epidural tumor.  Work up to determine primary site for CA pending.    PT Comments    Pt made significant progress with mobility today, she ambulated 22' with a platform walker, distance limited by pain/fatigue. Overall increased tolerance to activity today. Encouraged pt to attempt to position herself in sidelying at times do minimize pressure to her elbows, she spends most of her time in prone on elbows.   Follow Up Recommendations  CIR     Equipment Recommendations       Recommendations for Other Services OT consult     Precautions / Restrictions Precautions Precautions: Back Required Braces or Orthoses: Spinal Brace Spinal Brace: Lumbar corset Restrictions Weight Bearing Restrictions: No Other Position/Activity Restrictions: Pt unable to tolerate sitting backs out of bed in prone.      Mobility  Bed Mobility Overal bed mobility: Needs Assistance Bed Mobility: Rolling Rolling: Modified independent (Device/Increase time)         General bed mobility comments: pt mod I with rolling with prone to stand she requires min A to block LEs (she backs off edge of bed in prone with bed elevated)  Transfers Overall transfer level: Needs assistance Equipment used: (EVA walker (platform)) Transfers: Sit to/from Stand Sit to Stand: Min assist;+2 safety/equipment         General transfer comment: requires min A to move backwards into standing.  Attained full upright stance, but unable to talerate pain to try ambulation  Ambulation/Gait Ambulation/Gait assistance: Min guard;+2 safety/equipment Ambulation Distance (Feet): 24 Feet Assistive device:  (Eva walker) Gait Pattern/deviations: Step-through pattern;Decreased stride length     General Gait Details: heavy reliance on BUE support on platforms of eva walker, distance limited by fatigue/LLE pain   Stairs             Wheelchair Mobility    Modified Rankin (Stroke Patients Only)       Balance Overall balance assessment: Needs assistance     Sitting balance - Comments: unable to attempt at this time   Standing balance support: Bilateral upper extremity supported Standing balance-Leahy Scale: Poor Standing balance comment: reliant on bil. UE support due to increased pain                             Cognition Arousal/Alertness: Awake/alert Behavior During Therapy: WFL for tasks assessed/performed Overall Cognitive Status: Within Functional Limits for tasks assessed                                        Exercises      General Comments        Pertinent Vitals/Pain Pain Score: 8  Pain Location: back and LLE down to knee in standing Pain Descriptors / Indicators: Spasm Pain Intervention(s): Limited activity within patient's tolerance;Monitored during session;Repositioned    Home Living                      Prior Function            PT Goals (current goals can now be  found in the care plan section) Acute Rehab PT Goals Patient Stated Goal: pt likes to paint and decorate her home PT Goal Formulation: With patient Time For Goal Achievement: 10/02/17 Potential to Achieve Goals: Good Progress towards PT goals: Progressing toward goals    Frequency    Min 3X/week      PT Plan Current plan remains appropriate    Co-evaluation   Reason for Co-Treatment: For patient/therapist safety PT goals addressed during session: Mobility/safety with mobility OT goals addressed during session: Strengthening/ROM      AM-PAC PT "6 Clicks" Daily Activity  Outcome Measure  Difficulty turning over in bed (including  adjusting bedclothes, sheets and blankets)?: None Difficulty moving from lying on back to sitting on the side of the bed? : A Lot Difficulty sitting down on and standing up from a chair with arms (e.g., wheelchair, bedside commode, etc,.)?: Unable Help needed moving to and from a bed to chair (including a wheelchair)?: A Little Help needed walking in hospital room?: A Lot Help needed climbing 3-5 steps with a railing? : A Lot 6 Click Score: 14    End of Session Equipment Utilized During Treatment: Back brace Activity Tolerance: Patient limited by pain Patient left: in bed;with call bell/phone within reach Nurse Communication: Mobility status PT Visit Diagnosis: Other abnormalities of gait and mobility (R26.89);Muscle weakness (generalized) (M62.81)     Time: 6962-9528 PT Time Calculation (min) (ACUTE ONLY): 25 min  Charges:  $Gait Training: 8-22 mins                    G Codes:          Philomena Doheny 09/26/2017, 12:18 PM [phone]

## 2017-09-26 NOTE — Progress Notes (Signed)
Occupational Therapy Treatment Patient Details Name: Julia Nguyen MRN: [id number] DOB: 08/01/1945 Today's Date: 09/26/2017    History of present illness pt is a 72 y/o female with no significant medical hx except vertigo, admitted under neruosurgery servica for management of L3 pathological fracture due to metastatic disease, MRI showing epidural tumor.  Work up to determine primary site for CA pending.   OT comments  Pt is very motivated. She was able to stand statically for 90 seconds with min +2 assistance for safety.  Used EVA platform walker and ambulated just beyond door and back with the same level of assistance. She log rolls with supervision.  Revised goals; pt unable to sit due to pain at this time  Follow Up Recommendations  CIR    Equipment Recommendations  3 in 1 bedside commode    Recommendations for Other Services      Precautions / Restrictions Precautions Precautions: Back Required Braces or Orthoses: Spinal Brace Spinal Brace: Lumbar corset Restrictions Weight Bearing Restrictions: No Other Position/Activity Restrictions: Pt unable to tolerate sitting backs out of bed in prone.         Mobility Bed Mobility Overal bed mobility: Needs Assistance Bed Mobility: Rolling Rolling: Modified independent (Device/Increase time)         General bed mobility comments: pt mod I with rolling with prone to stand she requires min A to block LEs (she backs off edge of bed in prone with bed elevated)  Transfers Overall transfer level: Needs assistance Equipment used: (EVA walker (platform)) Transfers: Sit to/from Stand Sit to Stand: Min assist;+2 safety/equipment         General transfer comment: requires min A to move backwards into standing.  Attained full upright stance, but unable to tolerate pain to try ambulation    Balance Overall balance assessment: Needs assistance     Sitting balance - Comments: unable to attempt at this time   Standing balance  support: Bilateral upper extremity supported Standing balance-Leahy Scale: Poor Standing balance comment: reliant on bil. UE support due to increased pain                            ADL either performed or assessed with clinical judgement   ADL                                         General ADL Comments: pt was able to roll for purewick placement and stand for 90 seconds statically.  Used EVA walker and able to walk just past door and back     Manufacturing systems engineer      Cognition Arousal/Alertness: Awake/alert Behavior During Therapy: WFL for tasks assessed/performed Overall Cognitive Status: Within Functional Limits for tasks assessed                                          Exercises     Shoulder Instructions       General Comments      Pertinent Vitals/ Pain       Pain Score: 8  Pain Location: back and LLE down to knee in standing Pain Descriptors / Indicators: Spasm Pain Intervention(s): Limited activity within patient's tolerance;Monitored during  session;Repositioned  Home Living                                          Prior Functioning/Environment              Frequency  Min 3X/week        Progress Toward Goals  OT Goals(current goals can now be found in the care plan section)  Progress towards OT goals: Progressing toward goals  Acute Rehab OT Goals Patient Stated Goal: pt likes to paint and decorate her home Time For Goal Achievement: 10/10/17 Potential to Achieve Goals: Good ADL Goals Pt Will Perform Lower Body Bathing: with min assist;sit to/from stand;with adaptive equipment Pt Will Perform Lower Body Dressing: with min assist;bed level;with adaptive equipment Pt Will Transfer to Toilet: with min assist(rolling for bedpan) Additional ADL Goal #1: Pt will go sit to stand with min guard assistance and maintain for 2 minutes for ADLs  Plan       Co-evaluation    PT/OT/SLP Co-Evaluation/Treatment: Yes Reason for Co-Treatment: For patient/therapist safety PT goals addressed during session: Mobility/safety with mobility OT goals addressed during session: Strengthening/ROM      AM-PAC PT "6 Clicks" Daily Activity     Outcome Measure   Help from another person eating meals?: A Little Help from another person taking care of personal grooming?: A Little Help from another person toileting, which includes using toliet, bedpan, or urinal?: Total Help from another person bathing (including washing, rinsing, drying)?: A Lot Help from another person to put on and taking off regular upper body clothing?: A Lot Help from another person to put on and taking off regular lower body clothing?: A Lot 6 Click Score: 13    End of Session Equipment Utilized During Treatment: Back brace  OT Visit Diagnosis: Unsteadiness on feet (R26.81);Muscle weakness (generalized) (M62.81)   Activity Tolerance Patient limited by pain   Patient Left in bed;with call bell/phone within reach   Nurse Communication          Time: 8381-8403 OT Time Calculation (min): 34 min  Charges: OT General Charges $OT Visit: 1 Visit OT Treatments $Therapeutic Activity: 8-22 mins  Lesle Chris, OTR/L [phone] 09/26/2017   [place] 09/26/2017, 12:22 PM

## 2017-09-26 NOTE — Progress Notes (Signed)
HEMATOLOGY-ONCOLOGY PROGRESS NOTE  SUBJECTIVE: Patient is lying on her tummy because it is the best position for her given the back pain She reports that the back pain is improving slowly. Her husband was at the bedside. She was able to walk with the help of a back brace and physical therapy  OBJECTIVE: REVIEW OF SYSTEMS:   Constitutional: Denies fevers, chills or abnormal weight loss Eyes: Denies blurriness of vision Ears, nose, mouth, throat, and face: Poor taste in the mouth Respiratory: Chronic shortness of breath Cardiovascular: Denies palpitation, chest discomfort Gastrointestinal:  Denies nausea, heartburn or change in bowel habits Skin: Denies abnormal skin rashes Lymphatics: Denies new lymphadenopathy or easy bruising Neurological: Numbness of the tips of the toes Behavioral/Psych: Appears anxious Extremities: No lower extremity edema  All other systems were reviewed with the patient and are negative.  I have reviewed the past medical history, past surgical history, social history and family history with the patient and they are unchanged from previous note.   PHYSICAL EXAMINATION: ECOG PERFORMANCE STATUS: 3 - Symptomatic, >50% confined to bed  Vitals:   09/26/17 0551 09/26/17 1633  BP: (!) 133/58 (!) 143/71  Pulse: (!) 55 (!) 56  Resp: 16 17  Temp: 97.9 F (36.6 C) 98.1 F (36.7 C)  SpO2: 94% 91%   Filed Weights   09/21/17 2040  Weight: 150 lb (68 kg)    GENERAL:alert, no distress and comfortable SKIN: skin color, texture, turgor are normal, no rashes or significant lesions EYES: normal, Conjunctiva are pink and non-injected, sclera clear OROPHARYNX:no exudate, no erythema and lips, buccal mucosa, and tongue normal  NECK: supple, thyroid normal size, non-tender, without nodularity LYMPH:  no palpable lymphadenopathy in the cervical, axillary or inguinal LUNGS: clear to auscultation and percussion with normal breathing effort HEART: regular rate & rhythm  and no murmurs and no lower extremity edema ABDOMEN:abdomen soft, non-tender and normal bowel sounds Musculoskeletal:no cyanosis of digits and no clubbing  NEURO: alert & oriented x 3 with fluent speech, numbness of the tips of the feet  LABORATORY DATA:  I have reviewed the data as listed CMP Latest Ref Rng & Units 09/26/2017 09/25/2017 09/23/2017  Glucose 65 - 99 mg/dL 111(H) 151(H) 164(H)  BUN 6 - 20 mg/dL 31(H) 30(H) 26(H)  Creatinine 0.44 - 1.00 mg/dL 0.60 0.66 0.70  Sodium 135 - 145 mmol/L 140 139 137  Potassium 3.5 - 5.1 mmol/L 4.5 4.5 4.2  Chloride 101 - 111 mmol/L 106 103 101  CO2 22 - 32 mmol/L 25 22 26   Calcium 8.9 - 10.3 mg/dL 9.4 9.5 9.4  Total Protein 6.5 - 8.1 g/dL - - 6.0(L)  Total Bilirubin 0.3 - 1.2 mg/dL - - 0.6  Alkaline Phos 38 - 126 U/L - - 69  AST 15 - 41 U/L - - 21  ALT 14 - 54 U/L - - 41    Lab Results  Component Value Date   WBC 18.0 (H) 09/26/2017   HGB 16.5 (H) 09/26/2017   HCT 48.2 (H) 09/26/2017   MCV 88.9 09/26/2017   PLT 285 09/26/2017   NEUTROABS 17.8 (H) 09/25/2017    ASSESSMENT AND PLAN: 1.  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  I gave her the pathology report as well. I discussed with them that the standard procedure would be to obtain molecular testing on the cancer cells which Dr. Julien Nordmann is currently doing. Once  we have the molecular tests available then specific therapy recommendations can be made. I briefly discussed about targeted therapy as well as immunotherapy and chemotherapy. We also briefly discussed about prognosis which would be dependent on molecular abnormalities as well as response to therapy.  As part of the work-up patient will need a brain MRI.  2. Colonic lesion: Patient will need colonoscopy for further evaluation.  Because of the current issues of the spine, this could be done as an outpatient.  Patient and  her husband were happy to have discussed this with me. I provided them with the contact information for Norton Blizzard who is a thoracic navigator.

## 2017-09-26 NOTE — Progress Notes (Signed)
PROGRESS NOTE    Julia Nguyen  WGN:[id number] DOB: 10-Feb-1946 DOA: 09/17/2017 PCP: Patient, No Pcp Per   Brief Narrative:  Pt. with PMH of vertigo; admitted on 09/17/2017, presented with complaint of back pain, was found to have L3 pathological fracture secondary to metastatic disease with epidural tumor.  Underwent surgical biopsy and after consultation with radiation oncology patient was transferred to [facility] for radiation. Currently further plan is monitor improvement of radiation therapy and pain control and     Assessment & Plan:   Principal Problem:   Pathological fracture of lumbar vertebra Active Problems:   Metastatic cancer (HCC)   Lumbar burst fracture (HCC)   Pressure injury of skin   Constipation   Leukocytosis   Colonic mass   Kidney lesion  1.  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 L3 pathological fracture. S/p IR guided CT core biopsy of the L3 lesion. Surgical pathology consistent with poorly differentiated adenocarcinoma with lung primary. CT chest, abdomen and pelvis showing multiple bilateral pulmonary nodules concerning for possibly primary lung malignancy in the right upper lobe.  Also concern for possible colorectal mass in the cecum.  Patient has been consulted by radiation oncology and patient has been started on palliative radiotherapy to the lumbar spine with some clinical improvement.  Dr. Posey Pronto discussed findings on CT chest abdomen and pelvis as well as CT core biopsy findings with Dr. [name] of oncology who recommended outpatient establishing care.  He will discuss with his navigator. Patient in the prone position due to severe back pain.  Patient extremely frustrated that she is unable to see an oncologist to give her more information about her cancer.  Continue current pain regimen of Robaxin, IV Dilaudid as needed, Flexeril as needed, OxyIR as needed, IV Decadron.  Will  formally consult oncology to assess the patient for further evaluation and management.     2.  Constipation. Improved with current bowel regimen.  Follow.   3.  Leukocytosis. Likely steroid-induced.  WBC slowly trending down.  Afebrile.  No signs or symptoms of infection.  Hold off on antibiotics at this time.   4.  2 cm rim-enhancing mass in proximal cecal wall concerning for a primary colon cancer, Will need outpatient follow-up with oncology in the outpatient setting and possibly GI evaluation in the outpatient setting.   5.  2 cm lesion in left kidney, 12 mm lesion in the right kidney. MRI without and with contrast as an outpatient once stable from current pain and per outpatient follow-up with oncology.     DVT prophylaxis: SCDs Code Status: Full Family Communication: Updated patient.  No family at bedside. Disposition Plan: Likely skilled nursing facility when clinically improved.   Consultants:   Radiation oncology: [name], PA 09/19/2017  Interventional radiology: Dr. [name] 09/18/2017  Neurosurgery  Internal medicine residency  Oncology pending  Procedures:   CT-guided core biopsy of left L3 paravertebral mass per Dr. Vernard Gambles 09/20/2017  CT chest/CT abdomen and pelvis 09/17/2017    Antimicrobials:   None   Subjective: Patient lying in the prone position due to significant back pain.  Patient denies any shortness of breath.  No chest pain.  States back pain improving with radiation and current pain management.  Patient frustrated that she has not been able to see an oncologist to discuss her metastatic cancer and what options and prognosis she has.   Objective: Vitals:   09/25/17 0453 09/25/17 1744 09/25/17 2225  09/26/17 0551  BP: (!) 141/71 (!) 136/56 (!) 127/59 (!) 133/58  Pulse: 61 65 (!) 55 (!) 55  Resp: 16 20 16 16   Temp:  98.4 F (36.9 C) 98.1 F (36.7 C) 97.9 F (36.6 C)  TempSrc: Oral Oral Oral Oral  SpO2: 94% 96% 94% 94%  Weight:       Height:        Intake/Output Summary (Last 24 hours) at 09/26/2017 1240 Last data filed at 09/26/2017 0552 Gross per 24 hour  Intake 650 ml  Output 1000 ml  Net -350 ml   Filed Weights   09/21/17 2040  Weight: 68 kg (150 lb)    Examination:  General exam: Appears calm and comfortable  Respiratory system: Lungs clear to auscultation bilaterally.  No wheezes, no crackles, no rhonchi.  Respiratory effort normal. Cardiovascular system: Regular rate and rhythm no murmurs rubs or gallops. No pedal edema. Gastrointestinal system: Abdomen is soft, nontender, nondistended, positive bowel sounds.  No rebound.  No guarding.  Central nervous system: Alert and oriented. No focal neurological deficits. Extremities: Symmetric 5 x 5 power. Skin: No rashes, lesions or ulcers Psychiatry: Judgement and insight appear normal. Mood & affect appropriate.     Data Reviewed: I have personally reviewed following labs and imaging studies  CBC: Recent Labs  Lab 09/20/17 0409 09/23/17 0833 09/25/17 0927 09/26/17 0344  WBC 13.4* 19.8* 19.4* 18.0*  NEUTROABS  --  17.6* 17.8*  --   HGB 15.6* 16.9* 17.9* 16.5*  HCT 46.8* 50.6* 53.3* 48.2*  MCV 90.0 89.9 90.2 88.9  PLT 304 305 290 213   Basic Metabolic Panel: Recent Labs  Lab 09/20/17 0409 09/23/17 0833 09/25/17 0927 09/26/17 0344  NA 138 137 139 140  K 4.3 4.2 4.5 4.5  CL 103 101 103 106  CO2 26 26 22 25   GLUCOSE 110* 164* 151* 111*  BUN 27* 26* 30* 31*  CREATININE 0.74 0.70 0.66 0.60  CALCIUM 9.1 9.4 9.5 9.4  MG  --   --  2.4  --    GFR: Estimated Creatinine Clearance: 57.2 mL/min (by C-G formula based on SCr of 0.6 mg/dL). Liver Function Tests: Recent Labs  Lab 09/23/17 0833  AST 21  ALT 41  ALKPHOS 69  BILITOT 0.6  PROT 6.0*  ALBUMIN 3.0*   No results for input(s): LIPASE, AMYLASE in the last 168 hours. No results for input(s): AMMONIA in the last 168 hours. Coagulation Profile: No results for input(s): INR, PROTIME in  the last 168 hours. Cardiac Enzymes: No results for input(s): CKTOTAL, CKMB, CKMBINDEX, TROPONINI in the last 168 hours. BNP (last 3 results) No results for input(s): PROBNP in the last 8760 hours. HbA1C: No results for input(s): HGBA1C in the last 72 hours. CBG: Recent Labs  Lab 09/19/17 1650  GLUCAP 108*   Lipid Profile: No results for input(s): CHOL, HDL, LDLCALC, TRIG, CHOLHDL, LDLDIRECT in the last 72 hours. Thyroid Function Tests: No results for input(s): TSH, T4TOTAL, FREET4, T3FREE, THYROIDAB in the last 72 hours. Anemia Panel: No results for input(s): VITAMINB12, FOLATE, FERRITIN, TIBC, IRON, RETICCTPCT in the last 72 hours. Sepsis Labs: No results for input(s): PROCALCITON, LATICACIDVEN in the last 168 hours.  Recent Results (from the past 240 hour(s))  MRSA PCR Screening     Status: None   Collection Time: 09/17/17  4:10 PM  Result Value Ref Range Status   MRSA by PCR NEGATIVE NEGATIVE Final    Comment:  The GeneXpert MRSA Assay (FDA approved for NASAL specimens only), is one component of a comprehensive MRSA colonization surveillance program. It is not intended to diagnose MRSA infection nor to guide or monitor treatment for MRSA infections. Performed at [facility], [place] [address] [place], [place] [address]          Radiology Studies: No results found.      Scheduled Meds: . dexamethasone  6 mg Intravenous Q6H  . feeding supplement (ENSURE ENLIVE)  237 mL Oral BID BM  . methocarbamol  500 mg Oral BID  . nicotine  14 mg Transdermal Daily  . pantoprazole  80 mg Oral BID  . senna-docusate  1 tablet Oral BID  . sodium chloride flush  3 mL Intravenous Q12H   Continuous Infusions: . sodium chloride       LOS: 9 days    Time spent: 35 minutes    Irine Seal, MD [facility] Pager [phone]  If 7PM-7AM, please contact night-coverage [email or web address] Password [email or web address] 09/26/2017, 12:40 PM

## 2017-09-26 NOTE — Progress Notes (Signed)
Inpatient Rehabilitation  Reviewed case with IP Rehab Team this morning. We recommend a Pallative Care Consult to disucss prognosis and goals of care with patient.  Patient not currently able to tolerate this intensity of therapy, and am questioning if long-term care giver support is available, which she will likely require given that spouse works.   At this time patient more appropriate for SNF level of post acute rehab, but will plan to re-screen Tuesday, 5/7 if she remains in house.  In my absence please call one of my co-workers if questions Dimple Casey [phone] or Pamala Hurry [phone]. Discussed with attending and notified nurse case manager and CSW.    Carmelia Roller., CCC/SLP Admission Coordinator  [place]  Cell [phone]

## 2017-09-27 ENCOUNTER — Ambulatory Visit
Admit: 2017-09-27 | Discharge: 2017-09-27 | Disposition: A | Discharge status: Home / Self Care | Payer: Medicare Other | Attending: Radiation Oncology | Admitting: Radiation Oncology

## 2017-09-27 ENCOUNTER — Encounter: Payer: Self-pay | Admitting: Gastroenterology

## 2017-09-27 LAB — CBC
HCT: 48.4 % — ABNORMAL HIGH (ref 36.0–46.0)
HEMOGLOBIN: 16.6 g/dL — AB (ref 12.0–15.0)
MCH: 30.3 pg (ref 26.0–34.0)
MCHC: 34.3 g/dL (ref 30.0–36.0)
MCV: 88.5 fL (ref 78.0–100.0)
Platelets: 275 10*3/uL (ref 150–400)
RBC: 5.47 MIL/uL — ABNORMAL HIGH (ref 3.87–5.11)
RDW: 12.5 % (ref 11.5–15.5)
WBC: 18.8 10*3/uL — ABNORMAL HIGH (ref 4.0–10.5)

## 2017-09-27 LAB — BASIC METABOLIC PANEL
Anion gap: 11 (ref 5–15)
BUN: 27 mg/dL — ABNORMAL HIGH (ref 6–20)
CALCIUM: 9.1 mg/dL (ref 8.9–10.3)
CHLORIDE: 104 mmol/L (ref 101–111)
CO2: 22 mmol/L (ref 22–32)
CREATININE: 0.54 mg/dL (ref 0.44–1.00)
GFR calc Af Amer: >60 mL/min (ref >60)
Glucose, Bld: 102 mg/dL — ABNORMAL HIGH (ref 65–99)
Potassium: 4.3 mmol/L (ref 3.5–5.1)
SODIUM: 137 mmol/L (ref 135–145)

## 2017-09-27 NOTE — Progress Notes (Signed)
PROGRESS NOTE    Julia Nguyen  WUJ:[id number] DOB: 1945-07-12 DOA: 09/17/2017 PCP: Patient, No Pcp Per   Brief Narrative:  Pt. with PMH of vertigo; admitted on 09/17/2017, presented with complaint of back pain, was found to have L3 pathological fracture secondary to metastatic disease with epidural tumor.  Underwent surgical biopsy and after consultation with radiation oncology patient was transferred to [facility] for radiation. Currently further plan is monitor improvement of radiation therapy and pain control and     Assessment & Plan:   Principal Problem:   Pathological fracture of lumbar vertebra Active Problems:   Metastatic cancer (HCC)   Lumbar burst fracture (HCC)   Pressure injury of skin   Constipation   Leukocytosis   Colonic mass   Kidney lesion   Primary adenocarcinoma of right lung ([place])  1.  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 L3 pathological fracture. S/p IR guided CT core biopsy of the L3 lesion. Surgical pathology consistent with poorly differentiated adenocarcinoma with lung primary. CT chest, abdomen and pelvis showing multiple bilateral pulmonary nodules concerning for possibly primary lung malignancy in the right upper lobe.  Also concern for possible colorectal mass in the cecum.  Patient has been consulted by radiation oncology and patient has been started on palliative radiotherapy to the lumbar spine with some clinical improvement.  Dr. Posey Pronto discussed findings on CT chest abdomen and pelvis as well as CT core biopsy findings with Dr. [name] of oncology who recommended outpatient establishing care.  He will discuss with his navigator. Patient in the prone position due to severe back pain.  Patient was extremely frustrated that she was unable to see an oncologist to give her more information about her cancer during this hospitalization.  Spoke with Dr. [name] of oncology who  gracefully consulted on this patient discussed results of her scans and provided her with copies of reports as well as pathology report and discussed standard procedure, specific recommendations including immunotherapy and chemotherapy as well as prognosis.  Patient was very appreciative of consultation by Dr [name].  Continue current pain regimen of Robaxin, IV Dilaudid as needed, Flexeril as needed, OxyIR as needed, IV Decadron.  I could transition to oral Dilaudid soon.  Oncology recommended an MRI brain which can be done in outpatient setting.  Outpatient follow-up with oncology.   2.  Constipation. Continue current bowel regimen.  Follow.   3.  Leukocytosis. Likely steroid-induced.  WBC slowly trending down.  Afebrile.  No signs or symptoms of infection.  Need for antibiotics at this time.   4.  2 cm rim-enhancing mass in proximal cecal wall concerning for a primary colon cancer, Will need outpatient follow-up with gastroenterology in the outpatient setting for colonoscopy for further evaluation due to current spine issues.   5.  2 cm lesion in left kidney, 12 mm lesion in the right kidney. MRI without and with contrast as an outpatient once stable from current pain and per outpatient follow-up with oncology.     DVT prophylaxis: SCDs Code Status: Full Family Communication: Updated patient.  No family at bedside. Disposition Plan: Skilled nursing facility when clinically improved and okay with radiation oncology.     Consultants:   Radiation oncology: [name], PA 09/19/2017  Interventional radiology: Dr. [name] 09/18/2017  Neurosurgery  Internal medicine residency  Oncology: Dr. [name] 09/26/2017  Procedures:   CT-guided core biopsy of left L3 paravertebral mass per Dr. Vernard Gambles 09/20/2017  CT chest/CT  abdomen and pelvis 09/17/2017    Antimicrobials:   None   Subjective: Patient laying in prone position.  States back pain slowly improving daily with radiation  and current pain management.  Patient very appreciative of being able to discuss her cancer with oncology yesterday.  Patient noted to be able to ambulate a few steps with the help of back brace and PT.  Objective: Vitals:   09/25/17 2225 09/26/17 0551 09/26/17 1633 09/26/17 2311  BP: (!) 127/59 (!) 133/58 (!) 143/71 135/70  Pulse: (!) 55 (!) 55 (!) 56 62  Resp: 16 16 17 16   Temp: 98.1 F (36.7 C) 97.9 F (36.6 C) 98.1 F (36.7 C) 98.6 F (37 C)  TempSrc: Oral Oral Oral Oral  SpO2: 94% 94% 91% 96%  Weight:      Height:        Intake/Output Summary (Last 24 hours) at 09/27/2017 1221 Last data filed at 09/27/2017 0900 Gross per 24 hour  Intake 240 ml  Output 650 ml  Net -410 ml   Filed Weights   09/21/17 2040  Weight: 68 kg (150 lb)    Examination:  General exam: Appears calm and comfortable  Respiratory system: CTAB.  No crackles, no wheezes, no rhonchi.  Normal respiratory effort.  Cardiovascular system: RRR no murmurs rubs or gallops.  No pedal edema.  Gastrointestinal system: Abdomen is nontender, nondistended, soft, positive bowel sounds.  Central nervous system: Alert and oriented. No focal neurological deficits. Extremities: Symmetric 5 x 5 power. Skin: No rashes, lesions or ulcers Psychiatry: Judgement and insight appear normal. Mood & affect appropriate.     Data Reviewed: I have personally reviewed following labs and imaging studies  CBC: Recent Labs  Lab 09/23/17 0833 09/25/17 0927 09/26/17 0344 09/27/17 0402  WBC 19.8* 19.4* 18.0* 18.8*  NEUTROABS 17.6* 17.8*  --   --   HGB 16.9* 17.9* 16.5* 16.6*  HCT 50.6* 53.3* 48.2* 48.4*  MCV 89.9 90.2 88.9 88.5  PLT 305 290 285 161   Basic Metabolic Panel: Recent Labs  Lab 09/23/17 0833 09/25/17 0927 09/26/17 0344 09/27/17 0402  NA 137 139 140 137  K 4.2 4.5 4.5 4.3  CL 101 103 106 104  CO2 26 22 25 22   GLUCOSE 164* 151* 111* 102*  BUN 26* 30* 31* 27*  CREATININE 0.70 0.66 0.60 0.54  CALCIUM 9.4  9.5 9.4 9.1  MG  --  2.4  --   --    GFR: Estimated Creatinine Clearance: 57.2 mL/min (by C-G formula based on SCr of 0.54 mg/dL). Liver Function Tests: Recent Labs  Lab 09/23/17 0833  AST 21  ALT 41  ALKPHOS 69  BILITOT 0.6  PROT 6.0*  ALBUMIN 3.0*   No results for input(s): LIPASE, AMYLASE in the last 168 hours. No results for input(s): AMMONIA in the last 168 hours. Coagulation Profile: No results for input(s): INR, PROTIME in the last 168 hours. Cardiac Enzymes: No results for input(s): CKTOTAL, CKMB, CKMBINDEX, TROPONINI in the last 168 hours. BNP (last 3 results) No results for input(s): PROBNP in the last 8760 hours. HbA1C: No results for input(s): HGBA1C in the last 72 hours. CBG: No results for input(s): GLUCAP in the last 168 hours. Lipid Profile: No results for input(s): CHOL, HDL, LDLCALC, TRIG, CHOLHDL, LDLDIRECT in the last 72 hours. Thyroid Function Tests: No results for input(s): TSH, T4TOTAL, FREET4, T3FREE, THYROIDAB in the last 72 hours. Anemia Panel: No results for input(s): VITAMINB12, FOLATE, FERRITIN, TIBC, IRON, RETICCTPCT in  the last 72 hours. Sepsis Labs: No results for input(s): PROCALCITON, LATICACIDVEN in the last 168 hours.  Recent Results (from the past 240 hour(s))  MRSA PCR Screening     Status: None   Collection Time: 09/17/17  4:10 PM  Result Value Ref Range Status   MRSA by PCR NEGATIVE NEGATIVE Final    Comment:        The GeneXpert MRSA Assay (FDA approved for NASAL specimens only), is one component of a comprehensive MRSA colonization surveillance program. It is not intended to diagnose MRSA infection nor to guide or monitor treatment for MRSA infections. Performed at [facility], [place] [address] [place], [place] [address]          Radiology Studies: No results found.      Scheduled Meds: . dexamethasone  6 mg Intravenous Q6H  . feeding supplement (ENSURE ENLIVE)  237 mL Oral BID BM  . methocarbamol   500 mg Oral BID  . nicotine  14 mg Transdermal Daily  . pantoprazole  80 mg Oral BID  . senna-docusate  1 tablet Oral BID  . sodium chloride flush  3 mL Intravenous Q12H   Continuous Infusions: . sodium chloride       LOS: 10 days    Time spent: 35 minutes    Irine Seal, MD [facility] Pager [phone]  If 7PM-7AM, please contact night-coverage [email or web address] Password [email or web address] 09/27/2017, 12:21 PM

## 2017-09-27 NOTE — Progress Notes (Signed)
  Radiation Oncology         [phone]) [phone] ________________________________  Name: Julia Nguyen MRN: [id number]  Date: 09/20/2017  DOB: [birth year]  SIMULATION AND TREATMENT PLANNING NOTE  DIAGNOSIS:     ICD-10-CM   1. 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  Identity was confirmed.  All relevant records and images related to the planned course of therapy were reviewed.   Written consent to proceed with treatment was confirmed which was freely given after reviewing the details related to the planned course of therapy had been reviewed with the patient.  Then, the patient was set-up in a stable reproducible  prone position for radiation therapy.  CT images were obtained.  Surface markings were placed.     The CT images were loaded into the planning software.  Then the target and avoidance structures were contoured.  Treatment planning then occurred.  The radiation prescription was entered and confirmed.  A total of 2 complex treatment devices were fabricated which relate to the designed radiation treatment fields. Each of these customized fields/ complex treatment devices will be used on a daily basis during the radiation course. I have requested : Isodose Plan.   PLAN:  The patient will receive 30 Gy in 10 fractions.  ________________________________   Jodelle Gross, MD, PhD

## 2017-09-28 NOTE — Progress Notes (Signed)
PROGRESS NOTE    Julia Nguyen  TIR:[id number] DOB: [birth year] DOA: 09/17/2017 PCP: Patient, No Pcp Per   Brief Narrative:  Pt. with PMH of vertigo; admitted on 09/17/2017, presented with complaint of back pain, was found to have L3 pathological fracture secondary to metastatic disease with epidural tumor.  Underwent surgical biopsy and after consultation with radiation oncology patient was transferred to [facility] for radiation. Currently further plan is monitor improvement of radiation therapy and pain control and     Assessment & Plan:   Principal Problem:   Pathological fracture of lumbar vertebra Active Problems:   Secondary malignant neoplasm of bone and bone marrow (HCC)   Lumbar burst fracture (HCC)   Pressure injury of skin   Constipation   Leukocytosis   Colonic mass   Kidney lesion   Primary adenocarcinoma of right lung ([place])  1.  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 L3 pathological fracture. S/p IR guided CT core biopsy of the L3 lesion. Surgical pathology consistent with poorly differentiated adenocarcinoma with lung primary. CT chest, abdomen and pelvis showing multiple bilateral pulmonary nodules concerning for possibly primary lung malignancy in the right upper lobe.  Also concern for possible colorectal mass in the cecum.  Patient has been consulted by radiation oncology and patient has been started on palliative radiotherapy to the lumbar spine with some clinical improvement.  Dr. Posey Pronto discussed findings on CT chest abdomen and pelvis as well as CT core biopsy findings with Dr. [name] of oncology who recommended outpatient establishing care.  He will discuss with his navigator. Patient in the prone position due to severe back pain which seems to be slowly improving with radiation and pain management.  Patient was extremely frustrated that she was unable to see an oncologist to give her more  information about her cancer during this hospitalization.  Spoke with Dr. [name] of oncology who gracefully consulted on this patient discussed results of her scans and provided her with copies of reports as well as pathology report and discussed standard procedure, specific recommendations including immunotherapy and chemotherapy as well as prognosis.  Patient was very appreciative of consultation by Dr [name].  Continue current pain regimen of Robaxin, IV Dilaudid as needed, Flexeril as needed, OxyIR as needed, IV Decadron.  Will likely need to transition to oral Decadron soon. Oncology recommended an MRI brain which can be done in outpatient setting.  Outpatient follow-up with oncology.   2.  Constipation. Continue current bowel regimen.    3.  Leukocytosis. Likely steroid-induced.  WBC slowly trending down.  Afebrile.  No signs or symptoms of infection.  No Need for antibiotics at this time.   4.  2 cm rim-enhancing mass in proximal cecal wall concerning for a primary colon cancer, Will need outpatient follow-up with gastroenterology in the outpatient setting for colonoscopy for further evaluation as at this time patient likely unable to tolerate a colonoscopy until spinal issues improved.  Outpatient follow-up appointment has been scheduled for [place] 30, 2019.   5.  2 cm lesion in left kidney, 12 mm lesion in the right kidney. MRI without and with contrast as an outpatient once stable from current pain and per outpatient follow-up with oncology.     DVT prophylaxis: SCDs Code Status: Full Family Communication: Updated patient.  No family at bedside. Disposition Plan: Skilled nursing facility when clinically improved and okay with radiation oncology.     Consultants:   Radiation oncology: Ebony Hail  [place], [place] 09/19/2017  Interventional radiology: Dr. [name] 09/18/2017  Neurosurgery  Internal medicine residency  Oncology: Dr. [name] 09/26/2017  Procedures:   CT-guided core biopsy  of left L3 paravertebral mass per Dr. Vernard Gambles 09/20/2017  CT chest/CT abdomen and pelvis 09/17/2017    Antimicrobials:   None   Subjective: Patient in the prone position.  States back pain improved significantly since admission and able to turn a little bit on his side now which she was unable to do.  No chest pain or shortness of breath.  Awaiting for physical therapy today.   Objective: Vitals:   09/26/17 2311 09/27/17 1356 09/27/17 2214 09/28/17 0445  BP: 135/70 (!) 147/66 127/63 131/71  Pulse: 62 64 60 (!) 55  Resp: 16 14 16 16   Temp: 98.6 F (37 C) 97.7 F (36.5 C) 98 F (36.7 C) (!) 97.5 F (36.4 C)  TempSrc: Oral Oral Oral Oral  SpO2: 96% 95% 98% 95%  Weight:      Height:        Intake/Output Summary (Last 24 hours) at 09/28/2017 1244 Last data filed at 09/27/2017 1404 Gross per 24 hour  Intake 120 ml  Output 0 ml  Net 120 ml   Filed Weights   09/21/17 2040  Weight: 68 kg (150 lb)    Examination:  General exam: NAD Respiratory system: Lungs clear to auscultation bilaterally.  No wheezes, no crackles, no rhonchi.  Normal respiratory effort.  Cardiovascular system: Regular rate and rhythm no murmurs rubs or gallops.  No lower extremity edema.  No JVD. Gastrointestinal system: Abdomen is soft, nontender, nondistended, positive bowel sounds.  No rebound.  No guarding.   Central nervous system: Alert and oriented. No focal neurological deficits. Extremities: Symmetric 5 x 5 power. Skin: No rashes, lesions or ulcers Psychiatry: Judgement and insight appear normal. Mood & affect appropriate.     Data Reviewed: I have personally reviewed following labs and imaging studies  CBC: Recent Labs  Lab 09/23/17 0833 09/25/17 0927 09/26/17 0344 09/27/17 0402  WBC 19.8* 19.4* 18.0* 18.8*  NEUTROABS 17.6* 17.8*  --   --   HGB 16.9* 17.9* 16.5* 16.6*  HCT 50.6* 53.3* 48.2* 48.4*  MCV 89.9 90.2 88.9 88.5  PLT 305 290 285 016   Basic Metabolic Panel: Recent Labs    Lab 09/23/17 0833 09/25/17 0927 09/26/17 0344 09/27/17 0402  NA 137 139 140 137  K 4.2 4.5 4.5 4.3  CL 101 103 106 104  CO2 26 22 25 22   GLUCOSE 164* 151* 111* 102*  BUN 26* 30* 31* 27*  CREATININE 0.70 0.66 0.60 0.54  CALCIUM 9.4 9.5 9.4 9.1  MG  --  2.4  --   --    GFR: Estimated Creatinine Clearance: 57.2 mL/min (by C-G formula based on SCr of 0.54 mg/dL). Liver Function Tests: Recent Labs  Lab 09/23/17 0833  AST 21  ALT 41  ALKPHOS 69  BILITOT 0.6  PROT 6.0*  ALBUMIN 3.0*   No results for input(s): LIPASE, AMYLASE in the last 168 hours. No results for input(s): AMMONIA in the last 168 hours. Coagulation Profile: No results for input(s): INR, PROTIME in the last 168 hours. Cardiac Enzymes: No results for input(s): CKTOTAL, CKMB, CKMBINDEX, TROPONINI in the last 168 hours. BNP (last 3 results) No results for input(s): PROBNP in the last 8760 hours. HbA1C: No results for input(s): HGBA1C in the last 72 hours. CBG: No results for input(s): GLUCAP in the last 168 hours. Lipid Profile: No  results for input(s): CHOL, HDL, LDLCALC, TRIG, CHOLHDL, LDLDIRECT in the last 72 hours. Thyroid Function Tests: No results for input(s): TSH, T4TOTAL, FREET4, T3FREE, THYROIDAB in the last 72 hours. Anemia Panel: No results for input(s): VITAMINB12, FOLATE, FERRITIN, TIBC, IRON, RETICCTPCT in the last 72 hours. Sepsis Labs: No results for input(s): PROCALCITON, LATICACIDVEN in the last 168 hours.  No results found for this or any previous visit (from the past 240 hour(s)).       Radiology Studies: No results found.      Scheduled Meds: . dexamethasone  6 mg Intravenous Q6H  . feeding supplement (ENSURE ENLIVE)  237 mL Oral BID BM  . methocarbamol  500 mg Oral BID  . nicotine  14 mg Transdermal Daily  . pantoprazole  80 mg Oral BID  . senna-docusate  1 tablet Oral BID  . sodium chloride flush  3 mL Intravenous Q12H   Continuous Infusions: . sodium chloride        LOS: 11 days    Time spent: 35 minutes    Irine Seal, MD [facility] Pager [phone]  If 7PM-7AM, please contact night-coverage [email or web address] Password [email or web address] 09/28/2017, 12:44 PM

## 2017-09-28 NOTE — Progress Notes (Signed)
CSW met with the patient at bedside and provided SNF options. Patient chose [facility] for rehab.  Social work team will continue to assist w/ [place], Latanya Presser, MSW Clinical Social Worker  [phone] 09/28/2017  3:03 PM

## 2017-09-29 MED ORDER — DEXAMETHASONE 4 MG PO TABS
6.0000 mg | ORAL_TABLET | Freq: Four times a day (QID) | ORAL | Status: DC
  Administered 2017-09-29 – 2017-09-30 (×6): 6 mg via ORAL
  Filled 2017-09-29 (×7): qty 2

## 2017-09-29 MED ORDER — HYDROMORPHONE HCL 1 MG/ML IJ SOLN: 1.0000 mg | INTRAMUSCULAR | Status: DC | PRN

## 2017-09-29 NOTE — Progress Notes (Signed)
Pt continues to refuse OOB.

## 2017-09-29 NOTE — Progress Notes (Signed)
PROGRESS NOTE    Julia Nguyen  ZCH:[id number] DOB: 1945/10/14 DOA: 09/17/2017 PCP: Patient, No Pcp Per   Brief Narrative:  Pt. with PMH of vertigo; admitted on 09/17/2017, presented with complaint of back pain, was found to have L3 pathological fracture secondary to metastatic disease with epidural tumor.  Underwent surgical biopsy and after consultation with radiation oncology patient was transferred to [facility] for radiation. Currently further plan is monitor improvement of radiation therapy and pain control and     Assessment & Plan:   Principal Problem:   Pathological fracture of lumbar vertebra Active Problems:   Secondary malignant neoplasm of bone and bone marrow (HCC)   Lumbar burst fracture (HCC)   Pressure injury of skin   Constipation   Leukocytosis   Colonic mass   Kidney lesion   Primary adenocarcinoma of right lung ([place])  1.  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 L3 pathological fracture. S/p IR guided CT core biopsy of the L3 lesion. Surgical pathology consistent with poorly differentiated adenocarcinoma with lung primary. CT chest, abdomen and pelvis showing multiple bilateral pulmonary nodules concerning for possibly primary lung malignancy in the right upper lobe.  Also concern for possible colorectal mass in the cecum.  Patient has been consulted by radiation oncology and patient has been started on palliative radiotherapy to the lumbar spine with some clinical improvement.  Dr. Posey Pronto discussed findings on CT chest abdomen and pelvis as well as CT core biopsy findings with Dr. [name] of oncology who recommended outpatient establishing care.  He will discuss with his navigator. Patient in the prone position due to severe back pain which seems to be slowly improving with radiation and pain management.  Patient was extremely frustrated that she was unable to see an oncologist to give her more  information about her cancer during this hospitalization.  Spoke with Dr. [name] of oncology who gracefully consulted on this patient discussed results of her scans and provided her with copies of reports as well as pathology report and discussed standard procedure, specific recommendations including immunotherapy and chemotherapy as well as prognosis.  Patient was very appreciative of consultation by Dr [name].  Continue current pain regimen of Robaxin, Flexeril as needed, OxyIR as needed.  Change IV Decadron to oral Decadron.  Change IV Dilaudid from every 2 hours to every 4 hours as needed.  Oncology recommended an MRI brain which can be done in outpatient setting.  Outpatient follow-up with oncology.   2.  Constipation. Continue current bowel regimen.    3.  Leukocytosis. Steroid-induced.  WBC trending down.  Currently afebrile.  No need for antibiotics at this time.    4.  2 cm rim-enhancing mass in proximal cecal wall concerning for a primary colon cancer, Will need outpatient follow-up with gastroenterology in the outpatient setting for colonoscopy for further evaluation as at this time patient likely unable to tolerate a colonoscopy until spinal issues improved.  Outpatient follow-up appointment has been scheduled for [place] 30, 2019.   5.  2 cm lesion in left kidney, 12 mm lesion in the right kidney. MRI without and with contrast as an outpatient once stable from current pain and per outpatient follow-up with oncology.     DVT prophylaxis: SCDs Code Status: Full Family Communication: Updated patient.  No family at bedside. Disposition Plan: Skilled nursing facility when clinically improved and okay with radiation oncology hopefully in the next 24 hours.     Consultants:  Radiation oncology: [name], PA 09/19/2017  Interventional radiology: Dr. [name] 09/18/2017  Neurosurgery  Internal medicine residency  Oncology: Dr. [name] 09/26/2017  Procedures:   CT-guided core  biopsy of left L3 paravertebral mass per Dr. Vernard Gambles 09/20/2017  CT chest/CT abdomen and pelvis 09/17/2017    Antimicrobials:   None   Subjective: Patient laying in the prone position.  States back pain improving since admission and feels much better.  No chest pain.  No shortness of breath.     Objective: Vitals:   09/28/17 0445 09/28/17 1324 09/28/17 2011 09/29/17 0603  BP: 131/71 133/64 133/79 135/61  Pulse: (!) 55 61 66 (!) 53  Resp: 16  20 18   Temp: (!) 97.5 F (36.4 C) 98.1 F (36.7 C) 98.5 F (36.9 C) 98.1 F (36.7 C)  TempSrc: Oral Oral Oral Oral  SpO2: 95% 95% 96% 96%  Weight:      Height:        Intake/Output Summary (Last 24 hours) at 09/29/2017 1137 Last data filed at 09/29/2017 0604 Gross per 24 hour  Intake 240 ml  Output 400 ml  Net -160 ml   Filed Weights   09/21/17 2040  Weight: 68 kg (150 lb)    Examination:  General exam: Laying in the prone position.  NAD Respiratory system: CTAB. No wheezes, no crackles, no rhonchi.  Normal respiratory effort.  Cardiovascular system: RRR no murmurs rubs or gallops.  No JVD.  No edema.  Gastrointestinal system: Abdomen is nontender, nondistended, soft, positive bowel sounds.  No rebound.  No guarding.   Central nervous system: Alert and oriented. No focal neurological deficits. Extremities: Symmetric 5 x 5 power. Skin: No rashes, lesions or ulcers Psychiatry: Judgement and insight appear normal. Mood & affect appropriate.     Data Reviewed: I have personally reviewed following labs and imaging studies  CBC: Recent Labs  Lab 09/23/17 0833 09/25/17 0927 09/26/17 0344 09/27/17 0402  WBC 19.8* 19.4* 18.0* 18.8*  NEUTROABS 17.6* 17.8*  --   --   HGB 16.9* 17.9* 16.5* 16.6*  HCT 50.6* 53.3* 48.2* 48.4*  MCV 89.9 90.2 88.9 88.5  PLT 305 290 285 094   Basic Metabolic Panel: Recent Labs  Lab 09/23/17 0833 09/25/17 0927 09/26/17 0344 09/27/17 0402  NA 137 139 140 137  K 4.2 4.5 4.5 4.3  CL 101 103  106 104  CO2 26 22 25 22   GLUCOSE 164* 151* 111* 102*  BUN 26* 30* 31* 27*  CREATININE 0.70 0.66 0.60 0.54  CALCIUM 9.4 9.5 9.4 9.1  MG  --  2.4  --   --    GFR: Estimated Creatinine Clearance: 57.2 mL/min (by C-G formula based on SCr of 0.54 mg/dL). Liver Function Tests: Recent Labs  Lab 09/23/17 0833  AST 21  ALT 41  ALKPHOS 69  BILITOT 0.6  PROT 6.0*  ALBUMIN 3.0*   No results for input(s): LIPASE, AMYLASE in the last 168 hours. No results for input(s): AMMONIA in the last 168 hours. Coagulation Profile: No results for input(s): INR, PROTIME in the last 168 hours. Cardiac Enzymes: No results for input(s): CKTOTAL, CKMB, CKMBINDEX, TROPONINI in the last 168 hours. BNP (last 3 results) No results for input(s): PROBNP in the last 8760 hours. HbA1C: No results for input(s): HGBA1C in the last 72 hours. CBG: No results for input(s): GLUCAP in the last 168 hours. Lipid Profile: No results for input(s): CHOL, HDL, LDLCALC, TRIG, CHOLHDL, LDLDIRECT in the last 72 hours. Thyroid Function Tests:  No results for input(s): TSH, T4TOTAL, FREET4, T3FREE, THYROIDAB in the last 72 hours. Anemia Panel: No results for input(s): VITAMINB12, FOLATE, FERRITIN, TIBC, IRON, RETICCTPCT in the last 72 hours. Sepsis Labs: No results for input(s): PROCALCITON, LATICACIDVEN in the last 168 hours.  No results found for this or any previous visit (from the past 240 hour(s)).       Radiology Studies: No results found.      Scheduled Meds: . dexamethasone  6 mg Oral Q6H  . feeding supplement (ENSURE ENLIVE)  237 mL Oral BID BM  . methocarbamol  500 mg Oral BID  . nicotine  14 mg Transdermal Daily  . pantoprazole  80 mg Oral BID  . senna-docusate  1 tablet Oral BID  . sodium chloride flush  3 mL Intravenous Q12H   Continuous Infusions: . sodium chloride       LOS: 12 days    Time spent: 35 minutes    Irine Seal, MD [facility] Pager [phone]  If  7PM-7AM, please contact night-coverage [email or web address] Password [facility] 09/29/2017, 11:37 AM

## 2017-09-30 ENCOUNTER — Ambulatory Visit
Admit: 2017-09-30 | Discharge: 2017-09-30 | Disposition: A | Discharge status: Home / Self Care | Payer: Medicare Other | Attending: Radiation Oncology | Admitting: Radiation Oncology

## 2017-09-30 DIAGNOSIS — C799 Secondary malignant neoplasm of unspecified site: Secondary | ICD-10-CM

## 2017-09-30 DIAGNOSIS — M8448XP Pathological fracture, other site, subsequent encounter for fracture with malunion: Secondary | ICD-10-CM

## 2017-09-30 DIAGNOSIS — G893 Neoplasm related pain (acute) (chronic): Secondary | ICD-10-CM

## 2017-09-30 DIAGNOSIS — C3490 Malignant neoplasm of unspecified part of unspecified bronchus or lung: Secondary | ICD-10-CM

## 2017-09-30 LAB — BASIC METABOLIC PANEL
Anion gap: 10 (ref 5–15)
BUN: 25 mg/dL — ABNORMAL HIGH (ref 6–20)
CHLORIDE: 104 mmol/L (ref 101–111)
CO2: 23 mmol/L (ref 22–32)
CREATININE: 0.58 mg/dL (ref 0.44–1.00)
Calcium: 9 mg/dL (ref 8.9–10.3)
GFR calc non Af Amer: >60 mL/min (ref >60)
Glucose, Bld: 120 mg/dL — ABNORMAL HIGH (ref 65–99)
POTASSIUM: 4.3 mmol/L (ref 3.5–5.1)
SODIUM: 137 mmol/L (ref 135–145)

## 2017-09-30 LAB — CBC
HCT: 47.6 % — ABNORMAL HIGH (ref 36.0–46.0)
HEMOGLOBIN: 15.9 g/dL — AB (ref 12.0–15.0)
MCH: 29.5 pg (ref 26.0–34.0)
MCHC: 33.4 g/dL (ref 30.0–36.0)
MCV: 88.3 fL (ref 78.0–100.0)
Platelets: 225 10*3/uL (ref 150–400)
RBC: 5.39 MIL/uL — ABNORMAL HIGH (ref 3.87–5.11)
RDW: 12.6 % (ref 11.5–15.5)
WBC: 15.5 10*3/uL — AB (ref 4.0–10.5)

## 2017-09-30 MED ORDER — SENNOSIDES-DOCUSATE SODIUM 8.6-50 MG PO TABS: 1.0000 | ORAL_TABLET | Freq: Two times a day (BID) | ORAL | Status: AC

## 2017-09-30 MED ORDER — ENSURE ENLIVE PO LIQD: 237.0000 mL | Freq: Two times a day (BID) | ORAL | 0 refills | Status: DC

## 2017-09-30 MED ORDER — PANTOPRAZOLE SODIUM 40 MG PO TBEC: 40.0000 mg | DELAYED_RELEASE_TABLET | Freq: Two times a day (BID) | ORAL | 0 refills | Status: AC

## 2017-09-30 MED ORDER — CYCLOBENZAPRINE HCL 10 MG PO TABS: 10.0000 mg | ORAL_TABLET | Freq: Three times a day (TID) | ORAL | 0 refills | Status: DC | PRN

## 2017-09-30 MED ORDER — OXYCODONE HCL 15 MG PO TABS: 15.0000 mg | ORAL_TABLET | ORAL | 0 refills | Status: DC | PRN

## 2017-09-30 MED ORDER — TRAMADOL HCL 50 MG PO TABS: 50.0000 mg | ORAL_TABLET | Freq: Four times a day (QID) | ORAL | 0 refills | Status: DC | PRN

## 2017-09-30 MED ORDER — DEXAMETHASONE 6 MG PO TABS: 6.0000 mg | ORAL_TABLET | Freq: Three times a day (TID) | ORAL | 0 refills | Status: DC

## 2017-09-30 MED ORDER — POLYETHYLENE GLYCOL 3350 17 G PO PACK: 17.0000 g | PACK | Freq: Every day | ORAL | 0 refills | Status: AC | PRN

## 2017-09-30 MED ORDER — NICOTINE 14 MG/24HR TD PT24: 14.0000 mg | MEDICATED_PATCH | Freq: Every day | TRANSDERMAL | 0 refills | Status: AC

## 2017-09-30 NOTE — Discharge Summary (Addendum)
Physician Discharge Summary  Julia Nguyen [place] MRN:[id number] DOB: [birth year] DOA: 09/17/2017  PCP: Julia Nguyen, No Pcp Per  Admit date: 09/17/2017 Discharge date: 09/30/2017  Time spent: 65 minutes  Recommendations for Outpatient Follow-up:  1. Follow up with Dr. Lorna Nguyen, oncology 1 to 2 weeks. 2. Follow-up with Julia Bogus, PA gastroenterology Julia Nguyen 30, 2019 for further evaluation of cecal wall mass and probable evaluation for colonoscopy. 3. Follow-up with radiation oncology as scheduled for radiation treatments. 4. Follow up with MD at skilled nursing facility.  Julia Nguyen will need a basic metabolic profile done in 1 week as well as a CBC to follow-up on electrolytes renal function and H&H. 5. Follow-up with Dr. Saintclair Nguyen, neurosurgery in 2 weeks.   Discharge Diagnoses:  Principal Problem:   Pathological fracture of lumbar vertebra Active Problems:   Secondary malignant neoplasm of bone and bone marrow (HCC)   Lumbar burst fracture (HCC)   Pressure injury of skin   Constipation   Leukocytosis   Colonic mass   Kidney lesion   Primary adenocarcinoma of right lung (HCC)   Cancer associated pain   Metastatic cancer ([place])   Discharge Condition: Stable and improved  Diet recommendation: Regular  Filed Weights   09/21/17 2040  Weight: 68 kg (150 lb)    History of present illness:  Per Dr Julia Nguyen 72 year old female presented to the office doing a partners with back pain ordered an outpatient MRI scan.  Outpatient MRI came back showing probable metastatic disease to L3 with epidural tumor.  Julia Nguyen has had severe unrelenting back pain unable to take care of herself at home so came to the office today to go over the MRI and I have elected to admitted from the office.  She does have pain that radiates from her back down the backs of her legs occasionally into her calves and leg.  She has some numbness that travels down the front of her leg denies any numbness around her whole leg denies any  saddle anesthesia.  She has good strength in her legs most of it has been pain limited.  Says bladder is been working fine she has been constipated but she did have a bowel movement last night.   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 L3 pathological fracture. S/p IR guided CT core biopsy of the L3 lesion. Surgical pathology consistent with poorly differentiated adenocarcinoma with lung primary. CT chest, abdomen and pelvis showing multiple bilateral pulmonary nodules concerning for possibly primary lung malignancy in the right upper lobe.  Also concern for possible colorectal mass in the cecum.  Julia Nguyen has been consulted by radiation oncology and Julia Nguyen has been started on palliative radiotherapy to the lumbar spine with some clinical improvement.  Dr. Posey Pronto discussed findings on CT chest abdomen and pelvis as well as CT core biopsy findings with Dr. Lorna Nguyen of oncology whorecommended outpatient establishing care.He will discuss with his navigator. Julia Nguyen in the prone position throughout the hospitalization, due to severe back pain which seemed to be slowly improving with radiation and pain management.  Julia Nguyen was extremely frustrated that she was unable to see an oncologist to give her more information about her cancer early on in the hospitalization. I spoke with Dr. Lindi Nguyen of oncology who gracefully consulted on this Julia Nguyen discussed results of her scans and provided her with copies of reports as well as pathology report and discussed standard procedure, specific recommendations including immunotherapy and chemotherapy as well as  prognosis.  Julia Nguyen was very appreciative of consultation by Dr Julia Nguyen.    Julia Nguyen was maintained on a regimen of Flexeril as needed, Robaxin as needed, OxyIR as needed.   He was placed on IV Decadron and subsequently transitioned to oral Decadron which will be discharged on.  Julia Nguyen's  pain improved with radiation treatments and pain management.  Julia Nguyen will be discharged to skilled nursing facility and will follow up with radiation oncology, oncology, gastroenterology and neurosurgery in the outpatient setting.  2. Constipation. Resolved on a bowel regimen of Senokot twice a day as well as MiraLAX as needed.  Julia Nguyen will be discharged to skilled nursing facility on his bowel regimen.   3.Leukocytosis. Steroid-induced.  WBC trended down.  Afebrile.  Julia Nguyen had no signs or symptoms of infection.  Outpatient follow-up.   4.2 cm rim-enhancing mass in proximal cecal wall concerning for a primary colon cancer, Will need outpatient follow-up with gastroenterology in the outpatient setting for colonoscopy for further evaluation as at this time Julia Nguyen likely unable to tolerate a colonoscopy until spinal issues improved.  Outpatient follow-up appointment has been scheduled for Julia Nguyen 30, 2019.   5.2 cm lesion in left kidney, 12 mm lesion in the right kidney. MRI without and with contrast as an outpatient once stable from current pain and per outpatient follow-up with oncology.     Procedures:  CT-guided core biopsy of left L3 paravertebral mass per Dr. Vernard Gambles 09/20/2017  CT chest/CT abdomen and pelvis 09/17/2017        Consultations:  Radiation oncology: Julia Flank, PA 09/19/2017  Interventional radiology: Dr. Barbie Nguyen 09/18/2017  Neurosurgery  Internal medicine residency  Oncology: Dr. Lindi Nguyen 09/26/2017      Discharge Exam: Vitals:   09/30/17 0544 09/30/17 1254  BP: 127/70 (!) 115/55  Pulse: (!) 56 68  Resp: 18   Temp: 98.3 F (36.8 C) 97.9 F (36.6 C)  SpO2: 94% 93%    General: NAD Cardiovascular: RRR Respiratory: CTAB  Discharge Instructions   Discharge Instructions    Diet general   Complete by:  As directed    Increase activity slowly   Complete by:  As directed      Allergies as of 09/30/2017   No Known Allergies      Medication List    STOP taking these medications   cephALEXin 500 MG capsule Commonly known as:  KEFLEX   lidocaine 5 % Commonly known as:  LIDODERM   methocarbamol 500 MG tablet Commonly known as:  ROBAXIN   omeprazole 40 MG capsule Commonly known as:  PRILOSEC     TAKE these medications   BONINE 25 MG Chew Generic drug:  Meclizine HCl Chew 1 tablet by mouth as needed (dizziness).   cyclobenzaprine 10 MG tablet Commonly known as:  FLEXERIL Take 1 tablet (10 mg total) by mouth 3 (three) times daily as needed for muscle spasms.   dexamethasone 6 MG tablet Commonly known as:  DECADRON Take 1 tablet (6 mg total) by mouth 3 (three) times daily.   diclofenac 75 MG EC tablet Commonly known as:  VOLTAREN Take 75 mg by mouth 2 (two) times daily.   feeding supplement (ENSURE ENLIVE) Liqd Take 237 mLs by mouth 2 (two) times daily between meals.   naproxen sodium 220 MG tablet Commonly known as:  ALEVE Take 440 mg by mouth daily as needed (pain).   nicotine 14 mg/24hr patch Commonly known as:  NICODERM CQ - dosed in mg/24 hours Place 1 patch (14 mg total)  onto the skin daily. Start taking on:  10/01/2017   oxyCODONE 15 MG immediate release tablet Commonly known as:  ROXICODONE Take 1 tablet (15 mg total) by mouth every 3 (three) hours as needed for severe pain.   pantoprazole 40 MG tablet Commonly known as:  PROTONIX Take 1 tablet (40 mg total) by mouth 2 (two) times daily.   polyethylene glycol packet Commonly known as:  MIRALAX / GLYCOLAX Take 17 g by mouth daily as needed for mild constipation.   senna-docusate 8.6-50 MG tablet Commonly known as:  Senokot-S Take 1 tablet by mouth 2 (two) times daily.   traMADol 50 MG tablet Commonly known as:  ULTRAM Take 1 tablet (50 mg total) by mouth every 6 (six) hours as needed.      No Known Allergies Follow-up Information    Loralie Champagne, PA-C Follow up on 10/24/2017.   Specialty:  Gastroenterology Why:  10:00  am Contact information: [address] [address] [phone]        Kary Kos, MD. Schedule an appointment as soon as possible for a visit in 2 week(s).   Specialty:  Neurosurgery Contact information: [address]  [address] [phone]        [facility] Follow up.        Curt Bears, MD. Schedule an appointment as soon as possible for a visit in 2 week(s).   Specialty:  Oncology Why:  F/U IN 1-2 WEEKS. Contact information: [place] [address] [phone]            The results of significant diagnostics from this hospitalization (including imaging, microbiology, ancillary and laboratory) are listed below for reference.    Significant Diagnostic Studies: Ct Chest W Contrast  Result Date: 09/18/2017 CLINICAL DATA:  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 Evaluate for primary malignancy. EXAM: CT CHEST, ABDOMEN, AND PELVIS WITH CONTRAST TECHNIQUE: Multidetector CT imaging of the chest, abdomen and pelvis was performed following the standard protocol during bolus administration of intravenous contrast. CONTRAST:  121mL ISOVUE-300 IOPAMIDOL (ISOVUE-300) INJECTION 61% COMPARISON:  Lumbar spine MRI 09/15/2017 FINDINGS: CT CHEST FINDINGS Cardiovascular: The heart size is normal. No pericardial effusion. Coronary artery calcification is evident. Atherosclerotic calcification is noted in the wall of the thoracic aorta. Mediastinum/Nodes: 4.2 x 2.9 cm necrotic nodal mass identified in the precarinal station. 13 mm short axis subcarinal lymph node evident. Necrotic right hilar lymphadenopathy measures 2 cm in short axis. 9 mm short axis right hilar lymph node evident. Tiny hiatal hernia. The esophagus has normal imaging features. There is no axillary lymphadenopathy. Multiple thyroid nodules are evident bilaterally. Lungs/Pleura: Moderate changes of emphysema noted with upper  lobe predominance. 10 x 9 mm spiculated nodule identified in the right upper lobe (image 29/series 4). Additional (approximately 20) smaller nodules are identified in the lungs bilaterally, ranging in size from several mm up to about 5 mm. No focal airspace consolidation. No pleural effusion. Musculoskeletal: No lytic or sclerotic osseous abnormality identified in the thorax. CT ABDOMEN PELVIS FINDINGS Hepatobiliary: 10 mm focal area of low attenuation is identified in the medial segment left liver along the falciform ligament. This is in a characteristic location for focal fatty deposition, but more rounded and focal than typically seen in the setting of focal fat. 16 mm hypervascular lesion in the posterior right liver (61/3) is associated with apparent early filling of a right hepatic vein branch. There is  no evidence for gallstones, gallbladder wall thickening, or pericholecystic fluid. No intrahepatic or extrahepatic biliary dilation. Pancreas: No focal mass lesion. No dilatation of the main duct. No intraparenchymal cyst. No peripancreatic edema. Spleen: No splenomegaly. No focal mass lesion. Adrenals/Urinary Tract: Thickening of the adrenal glands noted bilaterally with potential tiny bilateral adrenal nodules measuring in the 7-8 mm size range. 12 mm lesion upper pole right kidney (6/8) has attenuation too high to be a simple cyst and has a suggestion of enhancement. 2 cm lesion in the lower pole the left kidney (70/3) has irregular margins and apparent irregular peripheral low level enhancement (see image 21/delayed series 8). No evidence for hydroureter. The urinary bladder appears normal for the degree of distention. Stomach/Bowel: Tiny hiatal hernia. Stomach otherwise unremarkable. Duodenum is normally positioned as is the ligament of Treitz. No small bowel wall thickening. No small bowel dilatation. The terminal ileum is normal. The appendix is normal. Cecal tip is directed cranially in the right lower  quadrant with the tip of the cecum up under the right liver. 2.0 x 2.6 x 2.2 cm rim enhancing mass is identified in the wall of the cecum proximal to the ileocecal valve (although positioned cranially), see image 79/3 and image 43/coronal series 6). Diverticular changes are noted in the left colon without evidence of diverticulitis. Vascular/Lymphatic: There is abdominal aortic atherosclerosis without aneurysm. No gastrohepatic ligament lymphadenopathy. 9 mm short axis lymph node in the hepatoduodenal ligament is upper normal. No para-aortic lymphadenopathy. No pelvic sidewall lymphadenopathy. Lymph nodes are identified adjacent to the cecum (image 81/series 3 and there is an 8 mm short axis lymph node in the ileocolic mesentery (54/2) worrisome for metastatic disease. Reproductive: Uterus unremarkable. 2.4 cm benign appearing cystic lesion identified in the left ovary. Right ovary unremarkable. Other: No intraperitoneal free fluid. Musculoskeletal: Left groin hernia contains only fat. Compression fracture at L3 again noted. Epidural and paraspinal tumor was better demonstrated on the previous lumbar spine MRI. IMPRESSION: 1. Multiple bilateral pulmonary nodules are associated with necrotic and relatively bulky mediastinal and right hilar lymphadenopathy. These imaging features are consistent with metastatic disease in a Julia Nguyen with recently demonstrated osseous metastatic involvement. 2. 10 mm spiculated nodule in the peripheral right upper lobe is the dominant lung nodule on today's study. This could represent a primary malignancy and account for the metastatic disease in the lungs and mediastinum, but please see below. 3. 2.6 cm irregular peripherally enhancing lesion in the cecum, highly concerning for primary colorectal neoplasm. Small para cecal lymph nodes and borderline enlarged ileocolic lymph node are concerning for metastatic disease. 4. 2 cm lesion in the lower pole of the left kidney appears to have  irregular peripheral low level enhancement. A similar but smaller 12 mm lesion is identified in the upper pole the right kidney. These could potentially represent complex cysts, apparent peripheral enhancement raises concern for neoplasm. While metastatic disease to the kidneys is not common, the bilateral involvement raises this question. Bilateral primary renal neoplasm also consideration. Dedicated abdominal MRI without and with contrast would likely prove helpful to further evaluate. 5. Low-density in the medial segment left liver adjacent to the falciform ligament is in a characteristic location for focal fatty deposition but the appearance is more focal and rounded than typically seen. This could also be further assessed at the time of MRI. Small hypervascular lesion in the posterior right liver likely vascular malformation, but could also be further assessed at MRI. 6. Probable tiny bilateral adrenal nodules. 7.  Aortic Atherosclerois (ICD10-170.0) 8.  Emphysema. ([id number]-L87.9) The findings of this exam were discussed with the Internal Medicine clinical team at the time of study interpretation. Electronically Signed   By: Misty Stanley M.D.   On: 09/18/2017 10:50   Mr Lumbar Spine Wo Contrast  Result Date: 09/15/2017 CLINICAL DATA:  Low back pain for 5 weeks, anterior leg pain. EXAM: MRI LUMBAR SPINE WITHOUT CONTRAST TECHNIQUE: Multiplanar, multisequence MR imaging of the lumbar spine was performed. No intravenous contrast was administered. COMPARISON:  Lumbar spine radiographs September 09, 2017 FINDINGS: SEGMENTATION: For the purposes of this report, the last well-formed intervertebral disc is reported as L5-S1. ALIGNMENT: Maintained lumbar lordosis. Minimal grade 1 L1-2 retrolisthesis. No spondylolysis. VERTEBRAE:Moderate L3 fracture approximate 50% central height loss. Diffusely low T1 signal L3 vertebral body extending into bilateral pedicles and LEFT transverse process. Similarly decreased signal 11 mm  lesion inferior endplate of L2, 11 mm lesion superior endplate of L4 and 14 mm lesion RIGHT iliac bone. Mild T11-12, mild L4-5 and moderate L5-S1 disc height loss with desiccation and proportional chronic discogenic endplate changes. L2-3 and L3-4 disc edema. CONUS MEDULLARIS AND CAUDA EQUINA: Conus medullaris terminates at L2 and demonstrates normal morphology and signal characteristics. Cauda equina is normal. PARASPINAL AND OTHER SOFT TISSUES: Nonacute. Abnormal low T1, intermediate T2 11 mm mass LEFT paraspinal muscles at L3, 8 mm mass LEFT paraspinal muscles at L1. DISC LEVELS: T12-L1: No disc bulge, canal stenosis nor neural foraminal narrowing. L1-2: Retrolisthesis. Small broad-based disc bulge. No canal stenosis or neural foraminal narrowing. L2-3: Moderate broad-based disc bulge. Tumoral extension through L3 vertebral body into ventral dural space measuring to 9 mm in AP dimension. Moderate canal stenosis at L3 with lateral recess effacement likely affecting the traversing LEFT greater than RIGHT L3 nerves. No neural foraminal narrowing. L3-4: Small broad-based disc bulge. Tumoral expansion through LEFT vertebral body. Mild canal stenosis with narrowed LEFT lateral recess likely affecting the traversing LEFT L4 nerve. Moderate bilateral neural foraminal narrowing. Exited LEFT L3 nerve impingement. L4-5: Moderate broad-based disc bulge asymmetric to LEFT. Mild facet arthropathy and ligamentum flavum redundancy. Mild canal stenosis. LEFT annular fissure. Mild RIGHT, mild to moderate LEFT neural foraminal narrowing. L5-S1: Moderate broad-based disc bulge asymmetric to LEFT versus LEFT extraforaminal disc protrusion contacting the exited LEFT L5 nerve. Moderate to severe facet arthropathy without canal stenosis. Severe LEFT neural foraminal narrowing. IMPRESSION: 1. 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 2. Moderate L3 pathologic fracture. Tumoral extends into epidural space resulting in  moderate canal stenosis. 3. Mild canal stenosis L3-4 and L4-5. Neural foraminal narrowing L3-4 through L5-S1: Severe on the LEFT at L5-S1. 4. These results will be called to the ordering clinician or representative by the Radiologist Assistant, and communication documented in the PACS or zVision Dashboard. Electronically Signed   By: Elon Alas M.D.   On: 09/15/2017 19:26   Ct Abdomen Pelvis W Contrast  Result Date: 09/18/2017 CLINICAL DATA:  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 Evaluate for primary malignancy. EXAM: CT CHEST, ABDOMEN, AND PELVIS WITH CONTRAST TECHNIQUE: Multidetector CT imaging of the chest, abdomen and pelvis was performed following the standard protocol during bolus administration of intravenous contrast. CONTRAST:  123mL ISOVUE-300 IOPAMIDOL (ISOVUE-300) INJECTION 61% COMPARISON:  Lumbar spine MRI 09/15/2017 FINDINGS: CT CHEST FINDINGS Cardiovascular: The heart size is normal. No pericardial effusion. Coronary artery calcification is evident. Atherosclerotic calcification is noted in the wall of the thoracic aorta. Mediastinum/Nodes: 4.2 x 2.9 cm necrotic  nodal mass identified in the precarinal station. 13 mm short axis subcarinal lymph node evident. Necrotic right hilar lymphadenopathy measures 2 cm in short axis. 9 mm short axis right hilar lymph node evident. Tiny hiatal hernia. The esophagus has normal imaging features. There is no axillary lymphadenopathy. Multiple thyroid nodules are evident bilaterally. Lungs/Pleura: Moderate changes of emphysema noted with upper lobe predominance. 10 x 9 mm spiculated nodule identified in the right upper lobe (image 29/series 4). Additional (approximately 20) smaller nodules are identified in the lungs bilaterally, ranging in size from several mm up to about 5 mm. No focal airspace consolidation. No pleural effusion. Musculoskeletal: No lytic or sclerotic osseous abnormality identified in the thorax.  CT ABDOMEN PELVIS FINDINGS Hepatobiliary: 10 mm focal area of low attenuation is identified in the medial segment left liver along the falciform ligament. This is in a characteristic location for focal fatty deposition, but more rounded and focal than typically seen in the setting of focal fat. 16 mm hypervascular lesion in the posterior right liver (61/3) is associated with apparent early filling of a right hepatic vein branch. There is no evidence for gallstones, gallbladder wall thickening, or pericholecystic fluid. No intrahepatic or extrahepatic biliary dilation. Pancreas: No focal mass lesion. No dilatation of the main duct. No intraparenchymal cyst. No peripancreatic edema. Spleen: No splenomegaly. No focal mass lesion. Adrenals/Urinary Tract: Thickening of the adrenal glands noted bilaterally with potential tiny bilateral adrenal nodules measuring in the 7-8 mm size range. 12 mm lesion upper pole right kidney (6/8) has attenuation too high to be a simple cyst and has a suggestion of enhancement. 2 cm lesion in the lower pole the left kidney (70/3) has irregular margins and apparent irregular peripheral low level enhancement (see image 21/delayed series 8). No evidence for hydroureter. The urinary bladder appears normal for the degree of distention. Stomach/Bowel: Tiny hiatal hernia. Stomach otherwise unremarkable. Duodenum is normally positioned as is the ligament of Treitz. No small bowel wall thickening. No small bowel dilatation. The terminal ileum is normal. The appendix is normal. Cecal tip is directed cranially in the right lower quadrant with the tip of the cecum up under the right liver. 2.0 x 2.6 x 2.2 cm rim enhancing mass is identified in the wall of the cecum proximal to the ileocecal valve (although positioned cranially), see image 79/3 and image 43/coronal series 6). Diverticular changes are noted in the left colon without evidence of diverticulitis. Vascular/Lymphatic: There is abdominal  aortic atherosclerosis without aneurysm. No gastrohepatic ligament lymphadenopathy. 9 mm short axis lymph node in the hepatoduodenal ligament is upper normal. No para-aortic lymphadenopathy. No pelvic sidewall lymphadenopathy. Lymph nodes are identified adjacent to the cecum (image 81/series 3 and there is an 8 mm short axis lymph node in the ileocolic mesentery (42/7) worrisome for metastatic disease. Reproductive: Uterus unremarkable. 2.4 cm benign appearing cystic lesion identified in the left ovary. Right ovary unremarkable. Other: No intraperitoneal free fluid. Musculoskeletal: Left groin hernia contains only fat. Compression fracture at L3 again noted. Epidural and paraspinal tumor was better demonstrated on the previous lumbar spine MRI. IMPRESSION: 1. Multiple bilateral pulmonary nodules are associated with necrotic and relatively bulky mediastinal and right hilar lymphadenopathy. These imaging features are consistent with metastatic disease in a Julia Nguyen with recently demonstrated osseous metastatic involvement. 2. 10 mm spiculated nodule in the peripheral right upper lobe is the dominant lung nodule on today's study. This could represent a primary malignancy and account for the metastatic disease in the lungs and mediastinum,  but please see below. 3. 2.6 cm irregular peripherally enhancing lesion in the cecum, highly concerning for primary colorectal neoplasm. Small para cecal lymph nodes and borderline enlarged ileocolic lymph node are concerning for metastatic disease. 4. 2 cm lesion in the lower pole of the left kidney appears to have irregular peripheral low level enhancement. A similar but smaller 12 mm lesion is identified in the upper pole the right kidney. These could potentially represent complex cysts, apparent peripheral enhancement raises concern for neoplasm. While metastatic disease to the kidneys is not common, the bilateral involvement raises this question. Bilateral primary renal neoplasm  also consideration. Dedicated abdominal MRI without and with contrast would likely prove helpful to further evaluate. 5. Low-density in the medial segment left liver adjacent to the falciform ligament is in a characteristic location for focal fatty deposition but the appearance is more focal and rounded than typically seen. This could also be further assessed at the time of MRI. Small hypervascular lesion in the posterior right liver likely vascular malformation, but could also be further assessed at MRI. 6. Probable tiny bilateral adrenal nodules. 7.  Aortic Atherosclerois (ICD10-170.0) 8.  Emphysema. ([id number]) The findings of this exam were discussed with the Internal Medicine clinical team at the time of study interpretation. Electronically Signed   By: Misty Stanley M.D.   On: 09/18/2017 10:50   Ct Biopsy  Result Date: 09/20/2017 CLINICAL DATA:  Lung mass, pulmonary nodules, hilar adenopathy. Pathologic fracture of L3 with paravertebral tumor. EXAM: CT GUIDED CORE BIOPSY OF PARAVERTEBRAL MASS ANESTHESIA/SEDATION: Intravenous Fentanyl and Versed were administered as conscious sedation during continuous monitoring of the Julia Nguyen's level of consciousness and physiological / cardiorespiratory status by the radiology RN, with a total moderate sedation time of 14 minutes. PROCEDURE: The procedure risks, benefits, and alternatives were explained to the Julia Nguyen. Questions regarding the procedure were encouraged and answered. The Julia Nguyen understands and consents to the procedure. Julia Nguyen placed prone. Select axial scans through L3 were obtained, and the left paravertebral soft tissue component was localized and an appropriate skin entry site determined and marked. The operative field was prepped with chlorhexidinein a sterile fashion, and a sterile drape was applied covering the operative field. A sterile gown and sterile gloves were used for the procedure. Local anesthesia was provided with 1% Lidocaine. Under  CT fluoroscopic guidance, a 17 gauge trocar needle was advanced to the margin of the lesion. Once needle tip position was confirmed, coaxial 18-gauge core biopsy samples were obtained, submitted in formalin to surgical pathology. The guide needle was removed. Postprocedure scans show no hemorrhage or other apparent complication. The Julia Nguyen tolerated the procedure well. COMPLICATIONS: None immediate FINDINGS: Pathologic L3 compression fracture was again demonstrated. Left paravertebral tumor extension was noted. Representative core biopsy samples were obtained. IMPRESSION: 1. Technically successful core biopsy of left L3 paravertebral mass. Electronically Signed   By: Lucrezia Europe M.D.   On: 09/20/2017 14:12    Microbiology: No results found for this or any previous visit (from the past 240 hour(s)).   Labs: Basic Metabolic Panel: Recent Labs  Lab 09/25/17 0927 09/26/17 0344 09/27/17 0402 09/30/17 0414  NA 139 140 137 137  K 4.5 4.5 4.3 4.3  CL 103 106 104 104  CO2 22 25 22 23   GLUCOSE 151* 111* 102* 120*  BUN 30* 31* 27* 25*  CREATININE 0.66 0.60 0.54 0.58  CALCIUM 9.5 9.4 9.1 9.0  MG 2.4  --   --   --    Liver Function  Tests: No results for input(s): AST, ALT, ALKPHOS, BILITOT, PROT, ALBUMIN in the last 168 hours. No results for input(s): LIPASE, AMYLASE in the last 168 hours. No results for input(s): AMMONIA in the last 168 hours. CBC: Recent Labs  Lab 09/25/17 0927 09/26/17 0344 09/27/17 0402 09/30/17 0414  WBC 19.4* 18.0* 18.8* 15.5*  NEUTROABS 17.8*  --   --   --   HGB 17.9* 16.5* 16.6* 15.9*  HCT 53.3* 48.2* 48.4* 47.6*  MCV 90.2 88.9 88.5 88.3  PLT 290 285 275 225   Cardiac Enzymes: No results for input(s): CKTOTAL, CKMB, CKMBINDEX, TROPONINI in the last 168 hours. BNP: BNP (last 3 results) No results for input(s): BNP in the last 8760 hours.  ProBNP (last 3 results) No results for input(s): PROBNP in the last 8760 hours.  CBG: No results for input(s): GLUCAP  in the last 168 hours.     Signed:  Irine Seal MD.  [facility] 09/30/2017, 3:22 PM

## 2017-09-30 NOTE — Clinical Social Work Placement (Signed)
Pt admitting to [facility]) SNF- report [phone] Will arrange [facility] transport (pt request she not be transported until husband comes to hospital to gather her belongings- husband en route) DC information provided via the [place]  NOTE  Date:  09/30/2017  Patient Details  Name: Julia Nguyen MRN: [id number] Date of Birth: Oct 30, 1945  Clinical Social Work is seeking post-discharge placement for this patient at the [place] level of care (*CSW will initial, date and re-position this form in  chart as items are completed):  Yes   Patient/family provided with [place] Work Department's list of facilities offering this level of care within the geographic area requested by the patient (or if unable, by the patient's family).  Yes   Patient/family informed of their freedom to choose among providers that offer the needed level of care, that participate in Medicare, Medicaid or managed care program needed by the patient, have an available bed and are willing to accept the patient.  Yes   Patient/family informed of [place]'s ownership interest in [facility] and [facility], as well as of the fact that they are under no obligation to receive care at these facilities.  PASRR submitted to EDS on 09/26/17     PASRR number received on 09/26/17     Existing PASRR number confirmed on       FL2 transmitted to all facilities in geographic area requested by pt/family on 09/26/17     FL2 transmitted to all facilities within larger geographic area on       Patient informed that his/her managed care company has contracts with or will negotiate with certain facilities, including the following:        Yes   Patient/family informed of bed offers received.  Patient chooses bed at [facility])     Physician recommends and patient chooses bed at [facility])    Patient to be transferred to [facility]) on 09/30/17.  Patient to be transferred to facility by PTAR     Patient family notified on 09/30/17 of transfer.  Name of family member notified:  husband Lonnie     PHYSICIAN       Additional Comment:    _______________________________________________ Nila Nephew, LCSW 09/30/2017, 3:33 PM [phone]

## 2017-09-30 NOTE — Progress Notes (Signed)
Physical Therapy Treatment Patient Details Name: Julia Nguyen MRN: [id number] DOB: 1946-03-27 Today's Date: 09/30/2017    History of Present Illness pt is a 72 y/o female with no significant medical hx except vertigo, admitted under neruosurgery servica for management of L3 pathological fracture due to metastatic disease, MRI showing epidural tumor.  Work up to determine primary site for CA pending.    PT Comments    Poor activity tolerance today, pt scooted in prone off edge of bed and stood briefly with a platform walker. She immediately reported LLE spasms and pain and could not ambulate. Assisted pt back to bed in prone, her preferred position, then performed LE exercises. Hot pack applied to L lower back. She reported decreased spasming and pain once she returned to prone position.    Follow Up Recommendations  SNF     Equipment Recommendations       Recommendations for Other Services OT consult     Precautions / Restrictions Precautions Precautions: Back Required Braces or Orthoses: Spinal Brace Spinal Brace: Lumbar corset Restrictions Other Position/Activity Restrictions: Pt unable to tolerate sitting backs out of bed in prone.      Mobility  Bed Mobility     Rolling: Modified independent (Device/Increase time)         General bed mobility comments: pt mod I with rolling with prone to stand she requires min A to block LEs (she backs off edge of bed in prone with bed elevated)  Transfers Overall transfer level: Needs assistance Equipment used: (EVA walker (platform)) Transfers: (prone to stand)           General transfer comment: Prone to stand: requires min A to block LEs as she  moved backwards into standing.  Attained full upright stance, but unable to talerate pain to try ambulation  Ambulation/Gait                 Stairs             Wheelchair Mobility    Modified Rankin (Stroke Patients Only)       Balance Overall balance  assessment: Needs assistance     Sitting balance - Comments: unable to attempt at this time   Standing balance support: Bilateral upper extremity supported Standing balance-Leahy Scale: Poor Standing balance comment: reliant on bil. UE support due to increased pain                             Cognition Arousal/Alertness: Awake/alert Behavior During Therapy: WFL for tasks assessed/performed Overall Cognitive Status: Within Functional Limits for tasks assessed                                        Exercises Total Joint Exercises Knee Flexion: AROM;Both;10 reps(prone) General Exercises - Lower Extremity Ankle Circles/Pumps: AROM;Both;10 reps(prone) Gluteal Sets: AROM;Both;10 reps(prone, with abdominal isometric as well)    General Comments        Pertinent Vitals/Pain Pain Score: 10-Worst pain ever Pain Location: back and LLE down to knee in standing Pain Descriptors / Indicators: Spasm Pain Intervention(s): Limited activity within patient's tolerance;Monitored during session;Repositioned; hot pack to L low back    Home Living                      Prior Function  PT Goals (current goals can now be found in the care plan section) Acute Rehab PT Goals Patient Stated Goal: pt likes to paint and decorate her home PT Goal Formulation: With patient Time For Goal Achievement: 10/02/17 Potential to Achieve Goals: Good Progress towards PT goals: Not progressing toward goals - comment(pain limited activity tolerance)    Frequency    Min 3X/week      PT Plan Current plan remains appropriate    Co-evaluation              AM-PAC PT "6 Clicks" Daily Activity  Outcome Measure  Difficulty turning over in bed (including adjusting bedclothes, sheets and blankets)?: None Difficulty moving from lying on back to sitting on the side of the bed? : Unable Difficulty sitting down on and standing up from a chair with arms (e.g.,  wheelchair, bedside commode, etc,.)?: Unable Help needed moving to and from a bed to chair (including a wheelchair)?: A Little Help needed walking in hospital room?: A Lot Help needed climbing 3-5 steps with a railing? : A Lot 6 Click Score: 13    End of Session Equipment Utilized During Treatment: Back brace Activity Tolerance: Patient limited by pain Patient left: in bed;with call bell/phone within reach Nurse Communication: Mobility status PT Visit Diagnosis: Other abnormalities of gait and mobility (R26.89);Muscle weakness (generalized) (M62.81)     Time: 6948-5462 PT Time Calculation (min) (ACUTE ONLY): 15 min  Charges:  $Therapeutic Activity: 8-22 mins                    G Codes:         Julia Nguyen 09/30/2017, 3:07 PM [phone]

## 2017-09-30 NOTE — Progress Notes (Signed)
[facility] here to transport patient. Patient is alert and orientated and not requiring pain medication.

## 2017-10-01 ENCOUNTER — Other Ambulatory Visit: Payer: Self-pay

## 2017-10-01 ENCOUNTER — Encounter: Payer: Self-pay | Admitting: *Deleted

## 2017-10-01 ENCOUNTER — Non-Acute Institutional Stay (SKILLED_NURSING_FACILITY): Payer: Medicare Other | Admitting: Adult Health

## 2017-10-01 ENCOUNTER — Ambulatory Visit
Admission: RE | Admit: 2017-10-01 | Discharge: 2017-10-01 | Disposition: A | Discharge status: Home / Self Care | Payer: Medicare Other | Source: Ambulatory Visit | Attending: Radiation Oncology | Admitting: Radiation Oncology

## 2017-10-01 ENCOUNTER — Telehealth: Payer: Self-pay | Admitting: *Deleted

## 2017-10-01 ENCOUNTER — Encounter: Payer: Self-pay | Admitting: Adult Health

## 2017-10-01 DIAGNOSIS — K639 Disease of intestine, unspecified: Secondary | ICD-10-CM

## 2017-10-01 DIAGNOSIS — K5900 Constipation, unspecified: Secondary | ICD-10-CM

## 2017-10-01 DIAGNOSIS — C7952 Secondary malignant neoplasm of bone marrow: Secondary | ICD-10-CM

## 2017-10-01 DIAGNOSIS — Z510 Encounter for antineoplastic radiation therapy: Secondary | ICD-10-CM | POA: Diagnosis not present

## 2017-10-01 DIAGNOSIS — F17200 Nicotine dependence, unspecified, uncomplicated: Secondary | ICD-10-CM

## 2017-10-01 DIAGNOSIS — C799 Secondary malignant neoplasm of unspecified site: Secondary | ICD-10-CM

## 2017-10-01 DIAGNOSIS — D72829 Elevated white blood cell count, unspecified: Secondary | ICD-10-CM | POA: Diagnosis not present

## 2017-10-01 DIAGNOSIS — M8448XP Pathological fracture, other site, subsequent encounter for fracture with malunion: Secondary | ICD-10-CM | POA: Diagnosis not present

## 2017-10-01 DIAGNOSIS — M150 Primary generalized (osteo)arthritis: Secondary | ICD-10-CM

## 2017-10-01 DIAGNOSIS — C7951 Secondary malignant neoplasm of bone: Secondary | ICD-10-CM

## 2017-10-01 DIAGNOSIS — K6389 Other specified diseases of intestine: Secondary | ICD-10-CM

## 2017-10-01 DIAGNOSIS — G893 Neoplasm related pain (acute) (chronic): Secondary | ICD-10-CM | POA: Diagnosis not present

## 2017-10-01 DIAGNOSIS — M159 Polyosteoarthritis, unspecified: Secondary | ICD-10-CM | POA: Insufficient documentation

## 2017-10-01 DIAGNOSIS — C3491 Malignant neoplasm of unspecified part of right bronchus or lung: Secondary | ICD-10-CM | POA: Diagnosis not present

## 2017-10-01 DIAGNOSIS — N289 Disorder of kidney and ureter, unspecified: Secondary | ICD-10-CM

## 2017-10-01 MED ORDER — TRAMADOL HCL 50 MG PO TABS: 50.0000 mg | ORAL_TABLET | Freq: Four times a day (QID) | ORAL | 0 refills | Status: AC | PRN

## 2017-10-01 MED ORDER — MORPHINE SULFATE ER 15 MG PO TBCR: 15.0000 mg | EXTENDED_RELEASE_TABLET | Freq: Two times a day (BID) | ORAL | 0 refills | Status: DC

## 2017-10-01 MED ORDER — OXYCODONE HCL 15 MG PO TABS: 15.0000 mg | ORAL_TABLET | ORAL | 0 refills | Status: DC | PRN

## 2017-10-01 NOTE — Telephone Encounter (Signed)
Rx faxed to [facility] (P) [phone], (F) [phone] 

## 2017-10-01 NOTE — Progress Notes (Signed)
Oncology Nurse Navigator Documentation  Oncology Nurse Navigator Flowsheets 10/01/2017  Navigator Location [facility]  Navigator Encounter Type Telephone/I received a call from [place] regarding an appt for patient.  I called them back at the number I was given and could not reach them. I did leave vm message.  I notified scheduling to call [facility] to arrange an appt with Dr. [name].   Telephone Incoming Call;Outgoing Call  Treatment Phase Treatment  Barriers/Navigation Needs Coordination of Care  Interventions Coordination of Care  Coordination of Care Other  Acuity Level 2  Time Spent with Patient 30

## 2017-10-01 NOTE — Progress Notes (Addendum)
Location:   [facility] Room Number: 107 A Place of Service:  SNF (31)   CODE STATUS: Full Code  No Known Allergies  Chief Complaint  Patient presents with  . Hospitalization Follow-up    Hospital follow up    HPI:  She is a 72 year old woman who had gone to see Dr. Saintclair Halsted regarding back pain. Had an MRI done and was found to have pathological fracture of lumbar spine; a tumor felt to be metastatic disease. She is a current smoker and has a 96 year pack year history. Other scans have found lung primary tumor; colon and renal mass. Interventional radiology performed a biopsy of the spinal tumor. Neurosurgery felt that this tumor would be best palliated by radiation.  She will need follow up with GI; oncology and neurosurgery.  At this time she is here for short term rehab; her goal is to return back home. I am not certain if this goal will be obtainable. She does have incontinence of her bowel and bladder; uncontrolled back pain and insomnia. She will be followed for her chronic illnesses including: back pain; gerd and cancer.     Past Medical History:  Diagnosis Date  . Arthritis    "possibly in some joints" (09/19/2017)  . Lower back pain     Past Surgical History:  Procedure Laterality Date  . NO PAST SURGERIES      Social History   Socioeconomic History  . Marital status: Married    Spouse name: Not on file  . Number of children: Not on file  . Years of education: Not on file  . Highest education level: Not on file  Occupational History  . Not on file  Social Needs  . Financial resource strain: Not on file  . Food insecurity:    Worry: Not on file    Inability: Not on file  . Transportation needs:    Medical: Not on file    Non-medical: Not on file  Tobacco Use  . Smoking status: Current Every Day Smoker    Packs/day: 2.00    Years: 46.00    Pack years: 92.00    Types: Cigarettes  . Smokeless tobacco: Never Used  Substance and Sexual  Activity  . Alcohol use: No  . Drug use: No  . Sexual activity: Not Currently  Lifestyle  . Physical activity:    Days per week: Not on file    Minutes per session: Not on file  . Stress: Not on file  Relationships  . Social connections:    Talks on phone: Not on file    Gets together: Not on file    Attends religious service: Not on file    Active member of club or organization: Not on file    Attends meetings of clubs or organizations: Not on file    Relationship status: Not on file  . Intimate partner violence:    Fear of current or ex partner: Not on file    Emotionally abused: Not on file    Physically abused: Not on file    Forced sexual activity: Not on file  Other Topics Concern  . Not on file  Social History Narrative  . Not on file   History reviewed. No pertinent family history.    VITAL SIGNS BP (!) 115/55   Pulse 68   Temp 98.3 F (36.8 C)   Resp 18   Ht 5\' 5"  (1.651 m)   Wt 150 lb (  68 kg)   BMI 24.96 kg/m   Outpatient Encounter Medications as of 10/01/2017  Medication Sig  . cyclobenzaprine (FLEXERIL) 10 MG tablet Take 1 tablet (10 mg total) by mouth 3 (three) times daily as needed for muscle spasms.  Marland Kitchen dexamethasone (DECADRON) 6 MG tablet Take 1 tablet (6 mg total) by mouth 3 (three) times daily.  . diclofenac (VOLTAREN) 75 MG EC tablet Take 75 mg by mouth 2 (two) times daily.  . feeding supplement, ENSURE ENLIVE, (ENSURE ENLIVE) LIQD Take 237 mLs by mouth 2 (two) times daily between meals.  . Meclizine HCl (BONINE) 25 MG CHEW Chew 1 tablet by mouth as needed (dizziness).   . nicotine (NICODERM CQ - DOSED IN MG/24 HOURS) 14 mg/24hr patch Place 1 patch (14 mg total) onto the skin daily.  Marland Kitchen oxyCODONE (ROXICODONE) 15 MG immediate release tablet Take 1 tablet (15 mg total) by mouth every 3 (three) hours as needed.  . pantoprazole (PROTONIX) 40 MG tablet Take 1 tablet (40 mg total) by mouth 2 (two) times daily.  . polyethylene glycol (MIRALAX / GLYCOLAX)  packet Take 17 g by mouth daily as needed for mild constipation.  . senna-docusate (SENOKOT-S) 8.6-50 MG tablet Take 1 tablet by mouth 2 (two) times daily.  . traMADol (ULTRAM) 50 MG tablet Take 1 tablet (50 mg total) by mouth every 6 (six) hours as needed.  . [DISCONTINUED] naproxen sodium (ALEVE) 220 MG tablet Take 440 mg by mouth daily as needed (pain).    No facility-administered encounter medications on file as of 10/01/2017.      SIGNIFICANT DIAGNOSTIC EXAMS  TODAY:   09-15-17: MRI lumbar spine:  1. 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 2. Moderate L3 pathologic fracture. Tumoral extends into epidural space resulting in moderate canal stenosis. 3. Mild canal stenosis L3-4 and L4-5. Neural foraminal narrowing L3-4 through L5-S1: Severe on the LEFT at L5-S1.  09-17-17: ct of chest abdomen and pelvis:  1. Multiple bilateral pulmonary nodules are associated with necrotic and relatively bulky mediastinal and right hilar lymphadenopathy. These imaging features are consistent with metastatic disease in a patient with recently demonstrated osseous metastatic involvement. 2. 10 mm spiculated nodule in the peripheral right upper lobe is the dominant lung nodule on today's study. This could represent a primary malignancy and account for the metastatic disease in the lungs and mediastinum, but please see below. 3. 2.6 cm irregular peripherally enhancing lesion in the cecum, highly concerning for primary colorectal neoplasm. Small para cecal lymph nodes and borderline enlarged ileocolic lymph node are concerning for metastatic disease. 4. 2 cm lesion in the lower pole of the left kidney appears to have irregular peripheral low level enhancement. A similar but smaller 12 mm lesion is identified in the upper pole the right kidney. These could potentially represent complex cysts, apparent peripheral enhancement raises concern for neoplasm. While metastatic disease to the kidneys is not  common, the bilateral involvement raises this question. Bilateral primary renal neoplasm also consideration. Dedicated abdominal MRI without and with contrast would likely prove helpful to further evaluate. 5. Low-density in the medial segment left liver adjacent to the falciform ligament is in a characteristic location for focal fatty deposition but the appearance is more focal and rounded than typically seen. This could also be further assessed at the time of MRI. Small hypervascular lesion in the posterior right liver likely vascular malformation, but could also be further assessed at MRI. 6. Probable tiny bilateral adrenal nodules. 7.  Aortic Atherosclerois (ICD10-170.0)  8.  Emphysema. (ICD10-J43.9)  09-20-17: ct guided biopsy of paravertebral mass: Technically successful core biopsy of left L3 paravertebral mass.    LABS REVIEWED; TODAY:   09-17-17: wbc 14.3; hgb 15.8; hct 46.4; mcv 88.9 ;plt 257; glucose 140; bun 16; creat 0.76; k+ 4.2; na++ 136; ca 9.3; liver normal albumin 3.1 09-23-17: wbc 19.8; hgb 16.9; hct 506; mcv 89.9; plt 305; glucose 164; bun 26; creat 0.70; k+ 4.2; na++ 137; ca 9.4; liver normal albumin 3.0 09-30-17: wbc 15.5; hgb 15.9; hct 47.6; mcv 88.3 plt 225;   Review of Systems  Constitutional: Negative for malaise/fatigue.  Respiratory: Negative for cough and shortness of breath.   Cardiovascular: Negative for chest pain, palpitations and leg swelling.  Gastrointestinal: Negative for abdominal pain, constipation and heartburn.       Incontinent of stool   Genitourinary:       Incontinent of urine   Musculoskeletal: Positive for back pain. Negative for joint pain and myalgias.  Skin: Negative.   Neurological: Negative for dizziness.  Psychiatric/Behavioral: The patient is not nervous/anxious.    Physical Exam  Constitutional: She is oriented to person, place, and time. She appears well-developed and well-nourished. No distress.  Neck: No thyromegaly present.    Cardiovascular: Normal rate, regular rhythm and intact distal pulses.  Murmur heard. 1/6  Pulmonary/Chest: Effort normal and breath sounds normal. No respiratory distress.  Abdominal: Soft. Bowel sounds are normal. She exhibits no distension. There is no tenderness.  Musculoskeletal: Normal range of motion. She exhibits no edema.  Lymphadenopathy:    She has no cervical adenopathy.  Neurological: She is alert and oriented to person, place, and time.  Skin: Skin is warm and dry. She is not diaphoretic.  Psychiatric: She has a normal mood and affect.       ASSESSMENT/ PLAN:  TODAY:   1.  Osteoarthritis of multiple joints: is without change will continue voltaren 75 mg twice daily   2. Current everyday smoker: is presently using nicotine patch 14 mg daily   3.  Gerd without esophagitis: stable will continue protonix 40 mg twice daily   4. Chronic constipation: stable will continue senna s twice daily and miralax daily as needed  5. Leukocytosis: without change; steroid induced: wbc 19.8; will monitor  6. 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 Has lumbar support brace but cannot tolerate at this time. Will continue decadron 6 mg three times daily will continue oxycodone 15 mg every 3 hours as needed; will change flexeril to 10 mg every 8 hours routinely and will continue robaxin 500 mg three times daily as needed. Will begin MS contin 15 mg every 12 hours.   7. Protein calorie malnutrition; moderate: weight is 150 pounds; albumin 3.0; will continue supplements as directed;   8. Polycythemia: without change hgb is 15.9; will monitor   Will check cbc; cmp    MD is aware of  resident's narcotic use and is in agreement with current plan of care. We will attempt to wean resident as apropriate   Ok Edwards NP [facility]  Contact [phone] Monday through Friday 8am- 5pm  After hours call [phone]

## 2017-10-01 NOTE — Telephone Encounter (Signed)
[facility] to inform them of Mrs. Mcelhaney's upcoming treatment appointment today as well as the rest of the week.  I asked that they have her here about 10 min before the appointment.  Spoke with Louann who would pass the information on to the supervisor.  Will continue to follow as necessary.  Cori Razor, RN

## 2017-10-02 ENCOUNTER — Ambulatory Visit
Admission: RE | Admit: 2017-10-02 | Discharge: 2017-10-02 | Disposition: A | Discharge status: Home / Self Care | Payer: Medicare Other | Source: Ambulatory Visit | Attending: Radiation Oncology | Admitting: Radiation Oncology

## 2017-10-02 ENCOUNTER — Telehealth: Payer: Self-pay | Admitting: Medical Oncology

## 2017-10-02 DIAGNOSIS — Z510 Encounter for antineoplastic radiation therapy: Secondary | ICD-10-CM | POA: Diagnosis not present

## 2017-10-02 NOTE — Telephone Encounter (Signed)
I contacted Dr. [name] but have not heard yet. I just called his nurse and left vm message for her to call me with an update.

## 2017-10-02 NOTE — Telephone Encounter (Signed)
Needs f/u in 1-2 weeks " With [facility]". Schedule request sent. Call Jolene with appt.

## 2017-10-03 ENCOUNTER — Ambulatory Visit: Payer: Medicare Other

## 2017-10-03 ENCOUNTER — Non-Acute Institutional Stay (SKILLED_NURSING_FACILITY): Payer: Medicare Other | Admitting: Internal Medicine

## 2017-10-03 ENCOUNTER — Encounter: Payer: Self-pay | Admitting: Internal Medicine

## 2017-10-03 ENCOUNTER — Encounter: Payer: Self-pay | Admitting: *Deleted

## 2017-10-03 ENCOUNTER — Telehealth: Payer: Self-pay | Admitting: *Deleted

## 2017-10-03 DIAGNOSIS — K639 Disease of intestine, unspecified: Secondary | ICD-10-CM

## 2017-10-03 DIAGNOSIS — C3491 Malignant neoplasm of unspecified part of right bronchus or lung: Secondary | ICD-10-CM

## 2017-10-03 DIAGNOSIS — C799 Secondary malignant neoplasm of unspecified site: Secondary | ICD-10-CM | POA: Diagnosis not present

## 2017-10-03 DIAGNOSIS — N289 Disorder of kidney and ureter, unspecified: Secondary | ICD-10-CM | POA: Diagnosis not present

## 2017-10-03 DIAGNOSIS — G893 Neoplasm related pain (acute) (chronic): Secondary | ICD-10-CM

## 2017-10-03 DIAGNOSIS — F17200 Nicotine dependence, unspecified, uncomplicated: Secondary | ICD-10-CM | POA: Diagnosis not present

## 2017-10-03 DIAGNOSIS — M8448XP Pathological fracture, other site, subsequent encounter for fracture with malunion: Secondary | ICD-10-CM

## 2017-10-03 DIAGNOSIS — D72829 Elevated white blood cell count, unspecified: Secondary | ICD-10-CM

## 2017-10-03 DIAGNOSIS — K6389 Other specified diseases of intestine: Secondary | ICD-10-CM

## 2017-10-03 NOTE — Telephone Encounter (Signed)
Spoke with Julia Nguyen at [facility] regarding Julia Nguyen.  She missed her radiation appointment today and Cindy informed me that it was due to transportation.  Cindy reassured me that she has her transportation issues fixed for tomorrow and she asked if her radiation and medical appointments could be closer to the same time.  I contacted the machine and they will accommodate this.  She is to have radiation following chemo.  I let Julia Nguyen know this and she said that they will have her here at 10 am for her medical appointment.  Will continue to follow as necessary.  Cori Razor, RN

## 2017-10-03 NOTE — Progress Notes (Signed)
Oncology Nurse Navigator Documentation  Oncology Nurse Navigator Flowsheets 10/03/2017  Navigator Location [facility]  Navigator Encounter Type Other/I called patient's living facility.  I updated them on her appt for tomorrow and when to arrive.  They verbalized understanding.   Treatment Phase Treatment  Barriers/Navigation Needs Coordination of Care  Interventions Coordination of Care  Coordination of Care Other  Acuity Level 1  Time Spent with Patient 30

## 2017-10-03 NOTE — Progress Notes (Signed)
Patient ID: Julia Nguyen, female   DOB: 07-03-1945, 72 y.o.   MRN: [id number]  Provider:  DR Arletha Grippe Location:  [facility] Room Number: 107 A Place of Service:  SNF (31)  PCP: Patient, No Pcp Per Patient Care Team: Patient, No Pcp Per as PCP - General (General Practice)  Extended Emergency Contact Information Primary Emergency Contact: Domagala,Lonnie Address: [address]          [place], [place] [address] [place] Phone: [phone] Relation: Spouse  Code Status: Full Code Goals of Care: Advanced Directive information Advanced Directives 10/01/2017  Does Patient Have a Medical Advance Directive? No  Would patient like information on creating a medical advance directive? No - Patient declined      Chief Complaint  Patient presents with  . New Admit To SNF    Admission    HPI: Patient is a 72 y.o. 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 She underwent IR guided CT core bx of L3 lesion --> surgical path (+) poorly differentiated adenoCA with lung primary. She was started on palliative radiotx to L spine. CT chest, abd/pelvis showed cecal mass 2 cm rim enhancing; left kidney 2 cmm and right 12 mm. Albumin 3; Hgb 15.9; WBCs 15.5K; SPEP neg; neg kappa/lambda chains. She presents to SNF for short term rehab.  Today she reports no pain in her back. No constipation or bloody stools. She has about 2 days remaining of XRT. She can now turn onto her back. No f/c. No loss of bowel/bladder control. No new LE weakness. She is scheduled for o/p colonoscopy [facility].  Osteoarthritis of multiple joints - pain stable on voltaren 75 mg twice daily   Current everyday smoker - currently on nicotine patch 14 mg daily   GERD - stable on  protonix 40 mg twice daily   Chronic constipation - stable on senna s twice daily and miralax daily as needed  Past Medical History:  Diagnosis Date  . Arthritis    "possibly in some joints" (09/19/2017)  . Lower back pain    Past Surgical History:  Procedure Laterality Date  . NO PAST SURGERIES      reports that she has been smoking cigarettes.  She has a 92.00 pack-year smoking history. She has never used smokeless tobacco. She reports that she does not drink alcohol or use drugs. Social History   Socioeconomic History  . Marital status: Married    Spouse name: Not on file  . Number of children: Not on file  . Years of education: Not on file  . Highest education level: Not on file  Occupational History  . Not on file  Social Needs  . Financial resource strain: Not on file  . Food insecurity:    Worry: Not on file    Inability: Not on file  . Transportation needs:    Medical: Not on file    Non-medical: Not on file  Tobacco Use  . Smoking status: Current Every Day Smoker    Packs/day: 2.00    Years: 46.00    Pack years: 92.00    Types: Cigarettes  . Smokeless tobacco: Never Used  Substance and Sexual Activity  . Alcohol use: No  . Drug use: No  . Sexual activity: Not  Currently  Lifestyle  . Physical activity:    Days per week: Not on file    Minutes per session: Not on file  . Stress: Not on file  Relationships  . Social connections:    Talks on phone: Not on file    Gets together: Not on file    Attends religious service: Not on file    Active member of club or organization: Not on file    Attends meetings of clubs or organizations: Not on file    Relationship status: Not on file  . Intimate partner violence:    Fear of current or ex partner: Not on file    Emotionally abused: Not on file    Physically abused: Not on file    Forced sexual activity: Not on file  Other Topics Concern  . Not on file  Social History Narrative  . Not on file     Functional Status Survey:    History reviewed. No pertinent family history.  Health Maintenance  Topic Date Due  . MAMMOGRAM  10/02/2018 (Originally 11/17/1994)  . DEXA SCAN  10/02/2018 (Originally 11/16/2009)  . COLONOSCOPY  10/02/2018 (Originally 11/17/1994)  . Hepatitis C Screening  10/02/2018 (Originally Jul 01, 1945)  . INFLUENZA VACCINE  12/26/2017  . TETANUS/TDAP  Discontinued  . PNA vac Low Risk Adult  Discontinued    No Known Allergies  Outpatient Encounter Medications as of 10/03/2017  Medication Sig  . cyclobenzaprine (FLEXERIL) 10 MG tablet Take 1 tablet (10 mg total) by mouth 3 (three) times daily as needed for muscle spasms.  Marland Kitchen dexamethasone (DECADRON) 6 MG tablet Take 1 tablet (6 mg total) by mouth 3 (three) times daily.  . diclofenac (VOLTAREN) 75 MG EC tablet Take 75 mg by mouth 2 (two) times daily.  . feeding supplement, ENSURE ENLIVE, (ENSURE ENLIVE) LIQD Take 237 mLs by mouth 2 (two) times daily between meals.  . Meclizine HCl (BONINE) 25 MG CHEW Chew 1 tablet by mouth every 8 (eight) hours as needed (dizziness).   . morphine (MS CONTIN) 15 MG 12 hr tablet Take 1 tablet (15 mg total) by mouth every 12 (twelve) hours.  . nicotine (NICODERM CQ - DOSED IN MG/24 HOURS) 14 mg/24hr patch Place 1 patch (14 mg total) onto the skin daily.  Marland Kitchen oxyCODONE (ROXICODONE) 15 MG immediate release tablet Take 1 tablet (15 mg total) by mouth every 3 (three) hours as needed.  . pantoprazole (PROTONIX) 40 MG tablet Take 1 tablet (40 mg total) by mouth 2 (two) times daily.  . polyethylene glycol (MIRALAX / GLYCOLAX) packet Take 17 g by mouth daily as needed for mild constipation.  . senna-docusate (SENOKOT-S) 8.6-50 MG tablet Take 1 tablet by mouth 2 (two) times daily.  . traMADol (ULTRAM) 50 MG tablet Take 1 tablet (50 mg total) by mouth every 6 (six) hours as needed.   No facility-administered encounter medications on file as of 10/03/2017.     Review of Systems  Musculoskeletal:  Positive for back pain and gait problem.  All other systems reviewed and are negative.   Vitals:   10/03/17 1011  BP: (!) 115/55  Pulse: 68  Resp: 18  Temp: 98.3 F (36.8 C)  Weight: 150 lb (68 kg)  Height: 5\' 5"  (1.651 m)   Body mass index is 24.96 kg/m. Physical Exam  Constitutional: She is oriented to person, place, and time. She appears well-developed.  Frail appearing in NAD, lying prone  HENT:  Mouth/Throat: Oropharynx is clear and moist. No  oropharyngeal exudate.  MMM; no oral thrush  Eyes: Pupils are equal, round, and reactive to light. No scleral icterus.  Neck: Neck supple. Carotid bruit is not present. No tracheal deviation present. No thyromegaly present.  Cardiovascular: Normal rate, regular rhythm and intact distal pulses. Exam reveals no gallop and no friction rub.  Murmur (1/6 SEM) heard. No LE edema b/l. no calf TTP.   Pulmonary/Chest: Effort normal and breath sounds normal. No stridor. No respiratory distress. She has no wheezes. She has no rales.  Abdominal: Soft. Normal appearance and bowel sounds are normal. She exhibits no distension and no mass. There is no hepatomegaly. There is no tenderness. There is no rigidity, no rebound and no guarding. No hernia.  Musculoskeletal: She exhibits edema.  Lumbar left bandage still intact  Lymphadenopathy:    She has no cervical adenopathy.  Neurological: She is alert and oriented to person, place, and time. She has normal reflexes.  Skin: Skin is warm and dry. No rash noted.  Psychiatric: She has a normal mood and affect. Her behavior is normal. Judgment and thought content normal.    Labs reviewed: Basic Metabolic Panel: Recent Labs    09/25/17 0927 09/26/17 0344 09/27/17 0402 09/30/17 0414  NA 139 140 137 137  K 4.5 4.5 4.3 4.3  CL 103 106 104 104  CO2 22 25 22 23   GLUCOSE 151* 111* 102* 120*  BUN 30* 31* 27* 25*  CREATININE 0.66 0.60 0.54 0.58  CALCIUM 9.5 9.4 9.1 9.0  MG 2.4  --   --   --    Liver  Function Tests: Recent Labs    09/17/17 1928 09/23/17 0833  AST 19 21  ALT 22 41  ALKPHOS 71 69  BILITOT 0.4 0.6  PROT 6.0* 6.0*  ALBUMIN 3.1* 3.0*   No results for input(s): LIPASE, AMYLASE in the last 8760 hours. No results for input(s): AMMONIA in the last 8760 hours. CBC: Recent Labs    09/23/17 0833 09/25/17 0927 09/26/17 0344 09/27/17 0402 09/30/17 0414  WBC 19.8* 19.4* 18.0* 18.8* 15.5*  NEUTROABS 17.6* 17.8*  --   --   --   HGB 16.9* 17.9* 16.5* 16.6* 15.9*  HCT 50.6* 53.3* 48.2* 48.4* 47.6*  MCV 89.9 90.2 88.9 88.5 88.3  PLT 305 290 285 275 225   Cardiac Enzymes: No results for input(s): CKTOTAL, CKMB, CKMBINDEX, TROPONINI in the last 8760 hours. BNP: Invalid input(s): POCBNP No results found for: HGBA1C No results found for: TSH No results found for: VITAMINB12 No results found for: FOLATE No results found for: IRON, TIBC, FERRITIN  Imaging and Procedures obtained prior to SNF admission: No results found.  Assessment/Plan   ICD-10-CM   1. Metastatic cancer ([facility]) C79.9   2. Colonic mass K63.9   3. Kidney lesion N28.9   4. 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 Current every day smoker F17.200   6. Leukocytosis, unspecified type D72.829   7. Adenocarcinoma of right lung, stage 4 (HCC) C34.91   8. 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 Communicated with pt and nursing.  Will follow  Labs/tests ordered: cbc, bmp    [facility]  Wells Guiles  [facility] [address] [place], [place] [address] [phone] Cell (Monday-Friday 8 AM - 5 PM) [phone] After 5  PM and follow prompts

## 2017-10-04 ENCOUNTER — Encounter (HOSPITAL_COMMUNITY): Payer: Self-pay | Admitting: Internal Medicine

## 2017-10-04 ENCOUNTER — Encounter: Payer: Self-pay | Admitting: Internal Medicine

## 2017-10-04 ENCOUNTER — Other Ambulatory Visit: Payer: Medicare Other

## 2017-10-04 ENCOUNTER — Ambulatory Visit
Admission: RE | Admit: 2017-10-04 | Discharge: 2017-10-04 | Disposition: A | Discharge status: Home / Self Care | Payer: Medicare Other | Source: Ambulatory Visit | Attending: Radiation Oncology | Admitting: Radiation Oncology

## 2017-10-04 ENCOUNTER — Telehealth: Payer: Self-pay | Admitting: Internal Medicine

## 2017-10-04 ENCOUNTER — Telehealth: Payer: Self-pay | Admitting: *Deleted

## 2017-10-04 ENCOUNTER — Inpatient Hospital Stay: Payer: Medicare Other | Attending: Internal Medicine | Admitting: Internal Medicine

## 2017-10-04 DIAGNOSIS — C7971 Secondary malignant neoplasm of right adrenal gland: Secondary | ICD-10-CM

## 2017-10-04 DIAGNOSIS — N281 Cyst of kidney, acquired: Secondary | ICD-10-CM

## 2017-10-04 DIAGNOSIS — J439 Emphysema, unspecified: Secondary | ICD-10-CM | POA: Diagnosis not present

## 2017-10-04 DIAGNOSIS — R590 Localized enlarged lymph nodes: Secondary | ICD-10-CM | POA: Diagnosis not present

## 2017-10-04 DIAGNOSIS — R5382 Chronic fatigue, unspecified: Secondary | ICD-10-CM | POA: Diagnosis not present

## 2017-10-04 DIAGNOSIS — C3490 Malignant neoplasm of unspecified part of unspecified bronchus or lung: Secondary | ICD-10-CM

## 2017-10-04 DIAGNOSIS — M48061 Spinal stenosis, lumbar region without neurogenic claudication: Secondary | ICD-10-CM

## 2017-10-04 DIAGNOSIS — Z7189 Other specified counseling: Secondary | ICD-10-CM | POA: Insufficient documentation

## 2017-10-04 DIAGNOSIS — C3491 Malignant neoplasm of unspecified part of right bronchus or lung: Secondary | ICD-10-CM | POA: Insufficient documentation

## 2017-10-04 DIAGNOSIS — C771 Secondary and unspecified malignant neoplasm of intrathoracic lymph nodes: Secondary | ICD-10-CM

## 2017-10-04 DIAGNOSIS — E042 Nontoxic multinodular goiter: Secondary | ICD-10-CM

## 2017-10-04 DIAGNOSIS — M8448XA Pathological fracture, other site, initial encounter for fracture: Secondary | ICD-10-CM | POA: Diagnosis not present

## 2017-10-04 DIAGNOSIS — N83202 Unspecified ovarian cyst, left side: Secondary | ICD-10-CM | POA: Insufficient documentation

## 2017-10-04 DIAGNOSIS — Z5112 Encounter for antineoplastic immunotherapy: Secondary | ICD-10-CM | POA: Insufficient documentation

## 2017-10-04 DIAGNOSIS — C7972 Secondary malignant neoplasm of left adrenal gland: Secondary | ICD-10-CM | POA: Insufficient documentation

## 2017-10-04 DIAGNOSIS — F17210 Nicotine dependence, cigarettes, uncomplicated: Secondary | ICD-10-CM | POA: Insufficient documentation

## 2017-10-04 DIAGNOSIS — Z79899 Other long term (current) drug therapy: Secondary | ICD-10-CM | POA: Insufficient documentation

## 2017-10-04 DIAGNOSIS — Z510 Encounter for antineoplastic radiation therapy: Secondary | ICD-10-CM | POA: Diagnosis not present

## 2017-10-04 DIAGNOSIS — I700 Atherosclerosis of aorta: Secondary | ICD-10-CM | POA: Insufficient documentation

## 2017-10-04 DIAGNOSIS — X58XXXS Exposure to other specified factors, sequela: Secondary | ICD-10-CM

## 2017-10-04 DIAGNOSIS — C7951 Secondary malignant neoplasm of bone: Secondary | ICD-10-CM | POA: Diagnosis not present

## 2017-10-04 DIAGNOSIS — R918 Other nonspecific abnormal finding of lung field: Secondary | ICD-10-CM | POA: Insufficient documentation

## 2017-10-04 DIAGNOSIS — M5126 Other intervertebral disc displacement, lumbar region: Secondary | ICD-10-CM | POA: Insufficient documentation

## 2017-10-04 HISTORY — DX: Malignant neoplasm of unspecified part of right bronchus or lung: C34.91

## 2017-10-04 NOTE — Telephone Encounter (Signed)
Unable to schedule 5/10 los - due to capped day - logged - will contact patient when appt scheduled.

## 2017-10-04 NOTE — Progress Notes (Signed)
START ON PATHWAY REGIMEN - Non-Small Cell Lung     A cycle is 21 days:     Pembrolizumab   **Always confirm dose/schedule in your pharmacy ordering system**    Patient Characteristics: Stage IV Metastatic, Nonsquamous, Initial Chemotherapy/Immunotherapy, PS = 0, 1, PD-L1 Expression Positive  ? 50% (TPS) AJCC T Category: T1b Current Disease Status: Distant Metastases AJCC N Category: N2 AJCC M Category: M1c AJCC 8 Stage Grouping: IVB Histology: Nonsquamous Cell ROS1 Rearrangement Status: Negative T790M Mutation Status: Not Applicable - EGFR Mutation Negative/Unknown Other Mutations/Biomarkers: No Other Actionable Mutations NTRK Gene Fusion Status: Negative PD-L1 Expression Status: PD-L1 Positive ? 50% (TPS) Chemotherapy/Immunotherapy LOT: Initial Chemotherapy/Immunotherapy Molecular Targeted Therapy: Not Appropriate ALK Translocation Status: Negative EGFR Mutation Status: Negative/Wild Type BRAF V600E Mutation Status: Negative Performance Status: PS = 0, 1 Intent of Therapy: Non-Curative / Palliative Intent, Discussed with Patient

## 2017-10-04 NOTE — Progress Notes (Signed)
[place] [phone]) [phone]   [phone]  CONSULT NOTE  REFERRING PHYSICIAN: Dr. Kyung Rudd  REASON FOR CONSULTATION:  72 years old white female recently diagnosed with lung cancer.  HPI Laney Louderback Maready is a 72 y.o. female with no significant past medical history but long history of heavy smoking.  The patient mentioned that in early April 2019 she has been complaining of back pain.  She was seen by her primary care physician had x-rays performed in the lower back that showed degenerative disc disease.  She was treated with pain medication with no improvement.  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  There was moderate L3 pathologic fracture with tumoral extent into epidural space resulting in moderate canal stenosis.  There was also mild canal stenosis at L3-4 and L4-5.  There was neural foraminal narrowing at L3-4 through L5-S1, severe on the left at L5-S1.  The patient was admitted to [facility] for further evaluation.  She had CT scan of the chest, abdomen and pelvis performed on 09/17/2017 and that showed 4.2 x 2.9 cm necrotic nodal mass identified in the precarinal station.  1.3 cm short axis subcarinal lymph node, necrotic right hilar lymphadenopathy measuring 2.0 cm in short axis, 0.9 cm short axis right hilar lymph node.  There was also 1.0 x 0.9 cm spiculated nodule identified in the right upper lobe, additional and approximately 20 smaller nodules identified in the lungs bilaterally up to 5 mm in size.  The scan also showed 2.6 cm irregular peripherally enhancing lesion in the cecum highly concerning for primary colorectal neoplasm.  There was a small pericecal lymph nodes and borderline enlarged with ileocolic lymph nodes concerning for metastatic disease.  There was also 2.0 cm lesion in the lower pole of the left  kidney appears to have irregular peripheral low level enhancement.  A similar but smaller 1.2 cm lesion identified in the upper pole of the right kidney.  This could potentially represent complex cysts.  The patient also had low density in the medial segment of the left liver adjacent to the falciform ligament suspicious for focal fatty deposition.  There was also probably tiny bilateral adrenal nodules. On 09/19/2017 the patient underwent core biopsy of the left L3 paravertebral mass by interventional radiology. 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  The core biopsy shows abundant metastatic poorly differentiated carcinoma.  Occasional tumor cells have vacuolated cytoplasm.  The immunohistochemistry shows a strong positivity with cytokeratin 7 and TTF-1.  The tumor was negative with CDX 2, cytokeratin 20, cytokeratin 5/6, estrogen receptor, progesterone receptor, GATA-3, GCDFP, Napsin-A, S100, Melan-A, SOX-10 and HMB-45.  The immunophenotype is most consistent with primary lung adenocarcinoma.  The tissue block was sent to [facility] for molecular studies and it showed no actionable mutations.  PDL 1 expression was 80%. The patient was a started on palliative radiotherapy to the T3 pathologic fracture area under the care of Dr. Lisbeth Renshaw.  She is feeling much better and her pain has significantly improved.  She does not take any pain medication right now.  She is currently on Decadron 6 mg p.o. Daily. Review of system she has no nausea, vomiting, diarrhea or constipation.  She continues to have weakness in the lower extremities and she is currently undergoing physical therapy.  She has mild cough but  no significant chest pain, shortness of breath or hemoptysis.  She denied having any nausea, vomiting or diarrhea but has constipation.  She also has intermittent headache and blurry vision. Family history significant for mother with cervical  cancer, father died from lung cancer at age 47 and brother had throat cancer. The patient is married and has 3 sons.  She used to work as a Special educational needs teacher.  She has a history for smoking 2 pack/day for around 56 years and quit on September 17, 2017.  She does drink alcohol occasionally and no history of drug abuse.  HPI  Past Medical History:  Diagnosis Date  . Arthritis    "possibly in some joints" (09/19/2017)  . Lower back pain     Past Surgical History:  Procedure Laterality Date  . NO PAST SURGERIES      No family history on file.  Social History Social History   Tobacco Use  . Smoking status: Current Every Day Smoker    Packs/day: 2.00    Years: 46.00    Pack years: 92.00    Types: Cigarettes  . Smokeless tobacco: Never Used  Substance Use Topics  . Alcohol use: No  . Drug use: No    No Known Allergies  Current Outpatient Medications  Medication Sig Dispense Refill  . cyclobenzaprine (FLEXERIL) 10 MG tablet Take 1 tablet (10 mg total) by mouth 3 (three) times daily as needed for muscle spasms. 20 tablet 0  . dexamethasone (DECADRON) 6 MG tablet Take 1 tablet (6 mg total) by mouth 3 (three) times daily. 60 tablet 0  . diclofenac (VOLTAREN) 75 MG EC tablet Take 75 mg by mouth 2 (two) times daily.    . feeding supplement, ENSURE ENLIVE, (ENSURE ENLIVE) LIQD Take 237 mLs by mouth 2 (two) times daily between meals. 237 mL 0  . Meclizine HCl (BONINE) 25 MG CHEW Chew 1 tablet by mouth every 8 (eight) hours as needed (dizziness).     . morphine (MS CONTIN) 15 MG 12 hr tablet Take 1 tablet (15 mg total) by mouth every 12 (twelve) hours. 60 tablet 0  . nicotine (NICODERM CQ - DOSED IN MG/24 HOURS) 14 mg/24hr patch Place 1 patch (14 mg total) onto the skin daily. 28 patch 0  . oxyCODONE (ROXICODONE) 15 MG immediate release tablet Take 1 tablet (15 mg total) by mouth every 3 (three) hours as needed. 120 tablet 0  . pantoprazole (PROTONIX) 40 MG tablet Take 1 tablet (40 mg  total) by mouth 2 (two) times daily. 60 tablet 0  . polyethylene glycol (MIRALAX / GLYCOLAX) packet Take 17 g by mouth daily as needed for mild constipation. 14 each 0  . senna-docusate (SENOKOT-S) 8.6-50 MG tablet Take 1 tablet by mouth 2 (two) times daily.    . traMADol (ULTRAM) 50 MG tablet Take 1 tablet (50 mg total) by mouth every 6 (six) hours as needed. 120 tablet 0   No current facility-administered medications for this visit.     Review of Systems  Constitutional: negative Eyes: negative Ears, nose, mouth, throat, and face: negative Respiratory: positive for cough Cardiovascular: negative Gastrointestinal: negative Genitourinary:negative Integument/breast: negative Hematologic/lymphatic: negative Musculoskeletal:positive for muscle weakness Neurological: negative Behavioral/Psych: negative Endocrine: negative Allergic/Immunologic: negative  Physical Exam  XWR:UEAVW, healthy, no distress, well nourished and well developed SKIN: skin color, texture, turgor are normal, no rashes or significant lesions HEAD: Normocephalic, No masses, lesions, tenderness or abnormalities EYES: normal, PERRLA, Conjunctiva are pink and non-injected EARS: External ears  normal, Canals clear OROPHARYNX:no exudate, no erythema and lips, buccal mucosa, and tongue normal  NECK: supple, no adenopathy, no JVD LYMPH:  no palpable lymphadenopathy, no hepatosplenomegaly BREAST:not examined LUNGS: clear to auscultation , and palpation HEART: regular rate & rhythm, no murmurs and no gallops ABDOMEN:abdomen soft, non-tender, normal bowel sounds and no masses or organomegaly BACK: Back symmetric, no curvature., No CVA tenderness EXTREMITIES:no joint deformities, effusion, or inflammation, no edema  NEURO: alert & oriented x 3 with fluent speech, no focal motor/sensory deficits  PERFORMANCE STATUS: ECOG 1  LABORATORY DATA: Lab Results  Component Value Date   WBC 15.5 (H) 09/30/2017   HGB 15.9 (H)  09/30/2017   HCT 47.6 (H) 09/30/2017   MCV 88.3 09/30/2017   PLT 225 09/30/2017      Chemistry      Component Value Date/Time   NA 137 09/30/2017 0414   K 4.3 09/30/2017 0414   CL 104 09/30/2017 0414   CO2 23 09/30/2017 0414   BUN 25 (H) 09/30/2017 0414   CREATININE 0.58 09/30/2017 0414      Component Value Date/Time   CALCIUM 9.0 09/30/2017 0414   ALKPHOS 69 09/23/2017 0833   AST 21 09/23/2017 0833   ALT 41 09/23/2017 0833   BILITOT 0.6 09/23/2017 0833       RADIOGRAPHIC STUDIES: Ct Chest W Contrast  Result Date: 09/18/2017 CLINICAL DATA:  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 Evaluate for primary malignancy. EXAM: CT CHEST, ABDOMEN, AND PELVIS WITH CONTRAST TECHNIQUE: Multidetector CT imaging of the chest, abdomen and pelvis was performed following the standard protocol during bolus administration of intravenous contrast. CONTRAST:  159m ISOVUE-300 IOPAMIDOL (ISOVUE-300) INJECTION 61% COMPARISON:  Lumbar spine MRI 09/15/2017 FINDINGS: CT CHEST FINDINGS Cardiovascular: The heart size is normal. No pericardial effusion. Coronary artery calcification is evident. Atherosclerotic calcification is noted in the wall of the thoracic aorta. Mediastinum/Nodes: 4.2 x 2.9 cm necrotic nodal mass identified in the precarinal station. 13 mm short axis subcarinal lymph node evident. Necrotic right hilar lymphadenopathy measures 2 cm in short axis. 9 mm short axis right hilar lymph node evident. Tiny hiatal hernia. The esophagus has normal imaging features. There is no axillary lymphadenopathy. Multiple thyroid nodules are evident bilaterally. Lungs/Pleura: Moderate changes of emphysema noted with upper lobe predominance. 10 x 9 mm spiculated nodule identified in the right upper lobe (image 29/series 4). Additional (approximately 20) smaller nodules are identified in the lungs bilaterally, ranging in size from several mm up to about 5 mm. No focal airspace  consolidation. No pleural effusion. Musculoskeletal: No lytic or sclerotic osseous abnormality identified in the thorax. CT ABDOMEN PELVIS FINDINGS Hepatobiliary: 10 mm focal area of low attenuation is identified in the medial segment left liver along the falciform ligament. This is in a characteristic location for focal fatty deposition, but more rounded and focal than typically seen in the setting of focal fat. 16 mm hypervascular lesion in the posterior right liver (61/3) is associated with apparent early filling of a right hepatic vein branch. There is no evidence for gallstones, gallbladder wall thickening, or pericholecystic fluid. No intrahepatic or extrahepatic biliary dilation. Pancreas: No focal mass lesion. No dilatation of the main duct. No intraparenchymal cyst. No peripancreatic edema. Spleen: No splenomegaly. No focal mass lesion. Adrenals/Urinary Tract: Thickening of the adrenal glands noted bilaterally with potential tiny bilateral adrenal nodules measuring in the 7-8 mm size range. 12 mm lesion upper pole right kidney (6/8) has attenuation too high  to be a simple cyst and has a suggestion of enhancement. 2 cm lesion in the lower pole the left kidney (70/3) has irregular margins and apparent irregular peripheral low level enhancement (see image 21/delayed series 8). No evidence for hydroureter. The urinary bladder appears normal for the degree of distention. Stomach/Bowel: Tiny hiatal hernia. Stomach otherwise unremarkable. Duodenum is normally positioned as is the ligament of Treitz. No small bowel wall thickening. No small bowel dilatation. The terminal ileum is normal. The appendix is normal. Cecal tip is directed cranially in the right lower quadrant with the tip of the cecum up under the right liver. 2.0 x 2.6 x 2.2 cm rim enhancing mass is identified in the wall of the cecum proximal to the ileocecal valve (although positioned cranially), see image 79/3 and image 43/coronal series 6).  Diverticular changes are noted in the left colon without evidence of diverticulitis. Vascular/Lymphatic: There is abdominal aortic atherosclerosis without aneurysm. No gastrohepatic ligament lymphadenopathy. 9 mm short axis lymph node in the hepatoduodenal ligament is upper normal. No para-aortic lymphadenopathy. No pelvic sidewall lymphadenopathy. Lymph nodes are identified adjacent to the cecum (image 81/series 3 and there is an 8 mm short axis lymph node in the ileocolic mesentery (81/8) worrisome for metastatic disease. Reproductive: Uterus unremarkable. 2.4 cm benign appearing cystic lesion identified in the left ovary. Right ovary unremarkable. Other: No intraperitoneal free fluid. Musculoskeletal: Left groin hernia contains only fat. Compression fracture at L3 again noted. Epidural and paraspinal tumor was better demonstrated on the previous lumbar spine MRI. IMPRESSION: 1. Multiple bilateral pulmonary nodules are associated with necrotic and relatively bulky mediastinal and right hilar lymphadenopathy. These imaging features are consistent with metastatic disease in a patient with recently demonstrated osseous metastatic involvement. 2. 10 mm spiculated nodule in the peripheral right upper lobe is the dominant lung nodule on today's study. This could represent a primary malignancy and account for the metastatic disease in the lungs and mediastinum, but please see below. 3. 2.6 cm irregular peripherally enhancing lesion in the cecum, highly concerning for primary colorectal neoplasm. Small para cecal lymph nodes and borderline enlarged ileocolic lymph node are concerning for metastatic disease. 4. 2 cm lesion in the lower pole of the left kidney appears to have irregular peripheral low level enhancement. A similar but smaller 12 mm lesion is identified in the upper pole the right kidney. These could potentially represent complex cysts, apparent peripheral enhancement raises concern for neoplasm. While  metastatic disease to the kidneys is not common, the bilateral involvement raises this question. Bilateral primary renal neoplasm also consideration. Dedicated abdominal MRI without and with contrast would likely prove helpful to further evaluate. 5. Low-density in the medial segment left liver adjacent to the falciform ligament is in a characteristic location for focal fatty deposition but the appearance is more focal and rounded than typically seen. This could also be further assessed at the time of MRI. Small hypervascular lesion in the posterior right liver likely vascular malformation, but could also be further assessed at MRI. 6. Probable tiny bilateral adrenal nodules. 7.  Aortic Atherosclerois (ICD10-170.0) 8.  Emphysema. ([id number]-H70.9) The findings of this exam were discussed with the Internal Medicine clinical team at the time of study interpretation. Electronically Signed   By: Misty Stanley M.D.   On: 09/18/2017 10:50   Mr Lumbar Spine Wo Contrast  Result Date: 09/15/2017 CLINICAL DATA:  Low back pain for 5 weeks, anterior leg pain. EXAM: MRI LUMBAR SPINE WITHOUT CONTRAST TECHNIQUE: Multiplanar, multisequence MR imaging  of the lumbar spine was performed. No intravenous contrast was administered. COMPARISON:  Lumbar spine radiographs September 09, 2017 FINDINGS: SEGMENTATION: For the purposes of this report, the last well-formed intervertebral disc is reported as L5-S1. ALIGNMENT: Maintained lumbar lordosis. Minimal grade 1 L1-2 retrolisthesis. No spondylolysis. VERTEBRAE:Moderate L3 fracture approximate 50% central height loss. Diffusely low T1 signal L3 vertebral body extending into bilateral pedicles and LEFT transverse process. Similarly decreased signal 11 mm lesion inferior endplate of L2, 11 mm lesion superior endplate of L4 and 14 mm lesion RIGHT iliac bone. Mild T11-12, mild L4-5 and moderate L5-S1 disc height loss with desiccation and proportional chronic discogenic endplate changes. L2-3 and  L3-4 disc edema. CONUS MEDULLARIS AND CAUDA EQUINA: Conus medullaris terminates at L2 and demonstrates normal morphology and signal characteristics. Cauda equina is normal. PARASPINAL AND OTHER SOFT TISSUES: Nonacute. Abnormal low T1, intermediate T2 11 mm mass LEFT paraspinal muscles at L3, 8 mm mass LEFT paraspinal muscles at L1. DISC LEVELS: T12-L1: No disc bulge, canal stenosis nor neural foraminal narrowing. L1-2: Retrolisthesis. Small broad-based disc bulge. No canal stenosis or neural foraminal narrowing. L2-3: Moderate broad-based disc bulge. Tumoral extension through L3 vertebral body into ventral dural space measuring to 9 mm in AP dimension. Moderate canal stenosis at L3 with lateral recess effacement likely affecting the traversing LEFT greater than RIGHT L3 nerves. No neural foraminal narrowing. L3-4: Small broad-based disc bulge. Tumoral expansion through LEFT vertebral body. Mild canal stenosis with narrowed LEFT lateral recess likely affecting the traversing LEFT L4 nerve. Moderate bilateral neural foraminal narrowing. Exited LEFT L3 nerve impingement. L4-5: Moderate broad-based disc bulge asymmetric to LEFT. Mild facet arthropathy and ligamentum flavum redundancy. Mild canal stenosis. LEFT annular fissure. Mild RIGHT, mild to moderate LEFT neural foraminal narrowing. L5-S1: Moderate broad-based disc bulge asymmetric to LEFT versus LEFT extraforaminal disc protrusion contacting the exited LEFT L5 nerve. Moderate to severe facet arthropathy without canal stenosis. Severe LEFT neural foraminal narrowing. IMPRESSION: 1. 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 2. Moderate L3 pathologic fracture. Tumoral extends into epidural space resulting in moderate canal stenosis. 3. Mild canal stenosis L3-4 and L4-5. Neural foraminal narrowing L3-4 through L5-S1: Severe on the LEFT at L5-S1. 4. These results will be called to the ordering clinician or representative by the Radiologist Assistant, and  communication documented in the PACS or zVision Dashboard. Electronically Signed   By: Elon Alas M.D.   On: 09/15/2017 19:26   Ct Abdomen Pelvis W Contrast  Result Date: 09/18/2017 CLINICAL DATA:  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 Evaluate for primary malignancy. EXAM: CT CHEST, ABDOMEN, AND PELVIS WITH CONTRAST TECHNIQUE: Multidetector CT imaging of the chest, abdomen and pelvis was performed following the standard protocol during bolus administration of intravenous contrast. CONTRAST:  131m ISOVUE-300 IOPAMIDOL (ISOVUE-300) INJECTION 61% COMPARISON:  Lumbar spine MRI 09/15/2017 FINDINGS: CT CHEST FINDINGS Cardiovascular: The heart size is normal. No pericardial effusion. Coronary artery calcification is evident. Atherosclerotic calcification is noted in the wall of the thoracic aorta. Mediastinum/Nodes: 4.2 x 2.9 cm necrotic nodal mass identified in the precarinal station. 13 mm short axis subcarinal lymph node evident. Necrotic right hilar lymphadenopathy measures 2 cm in short axis. 9 mm short axis right hilar lymph node evident. Tiny hiatal hernia. The esophagus has normal imaging features. There is no axillary lymphadenopathy. Multiple thyroid nodules are evident bilaterally. Lungs/Pleura: Moderate changes of emphysema noted with upper lobe predominance. 10 x 9 mm spiculated nodule identified in the right  upper lobe (image 29/series 4). Additional (approximately 20) smaller nodules are identified in the lungs bilaterally, ranging in size from several mm up to about 5 mm. No focal airspace consolidation. No pleural effusion. Musculoskeletal: No lytic or sclerotic osseous abnormality identified in the thorax. CT ABDOMEN PELVIS FINDINGS Hepatobiliary: 10 mm focal area of low attenuation is identified in the medial segment left liver along the falciform ligament. This is in a characteristic location for focal fatty deposition, but more rounded and focal  than typically seen in the setting of focal fat. 16 mm hypervascular lesion in the posterior right liver (61/3) is associated with apparent early filling of a right hepatic vein branch. There is no evidence for gallstones, gallbladder wall thickening, or pericholecystic fluid. No intrahepatic or extrahepatic biliary dilation. Pancreas: No focal mass lesion. No dilatation of the main duct. No intraparenchymal cyst. No peripancreatic edema. Spleen: No splenomegaly. No focal mass lesion. Adrenals/Urinary Tract: Thickening of the adrenal glands noted bilaterally with potential tiny bilateral adrenal nodules measuring in the 7-8 mm size range. 12 mm lesion upper pole right kidney (6/8) has attenuation too high to be a simple cyst and has a suggestion of enhancement. 2 cm lesion in the lower pole the left kidney (70/3) has irregular margins and apparent irregular peripheral low level enhancement (see image 21/delayed series 8). No evidence for hydroureter. The urinary bladder appears normal for the degree of distention. Stomach/Bowel: Tiny hiatal hernia. Stomach otherwise unremarkable. Duodenum is normally positioned as is the ligament of Treitz. No small bowel wall thickening. No small bowel dilatation. The terminal ileum is normal. The appendix is normal. Cecal tip is directed cranially in the right lower quadrant with the tip of the cecum up under the right liver. 2.0 x 2.6 x 2.2 cm rim enhancing mass is identified in the wall of the cecum proximal to the ileocecal valve (although positioned cranially), see image 79/3 and image 43/coronal series 6). Diverticular changes are noted in the left colon without evidence of diverticulitis. Vascular/Lymphatic: There is abdominal aortic atherosclerosis without aneurysm. No gastrohepatic ligament lymphadenopathy. 9 mm short axis lymph node in the hepatoduodenal ligament is upper normal. No para-aortic lymphadenopathy. No pelvic sidewall lymphadenopathy. Lymph nodes are  identified adjacent to the cecum (image 81/series 3 and there is an 8 mm short axis lymph node in the ileocolic mesentery (42/8) worrisome for metastatic disease. Reproductive: Uterus unremarkable. 2.4 cm benign appearing cystic lesion identified in the left ovary. Right ovary unremarkable. Other: No intraperitoneal free fluid. Musculoskeletal: Left groin hernia contains only fat. Compression fracture at L3 again noted. Epidural and paraspinal tumor was better demonstrated on the previous lumbar spine MRI. IMPRESSION: 1. Multiple bilateral pulmonary nodules are associated with necrotic and relatively bulky mediastinal and right hilar lymphadenopathy. These imaging features are consistent with metastatic disease in a patient with recently demonstrated osseous metastatic involvement. 2. 10 mm spiculated nodule in the peripheral right upper lobe is the dominant lung nodule on today's study. This could represent a primary malignancy and account for the metastatic disease in the lungs and mediastinum, but please see below. 3. 2.6 cm irregular peripherally enhancing lesion in the cecum, highly concerning for primary colorectal neoplasm. Small para cecal lymph nodes and borderline enlarged ileocolic lymph node are concerning for metastatic disease. 4. 2 cm lesion in the lower pole of the left kidney appears to have irregular peripheral low level enhancement. A similar but smaller 12 mm lesion is identified in the upper pole the right kidney. These could  potentially represent complex cysts, apparent peripheral enhancement raises concern for neoplasm. While metastatic disease to the kidneys is not common, the bilateral involvement raises this question. Bilateral primary renal neoplasm also consideration. Dedicated abdominal MRI without and with contrast would likely prove helpful to further evaluate. 5. Low-density in the medial segment left liver adjacent to the falciform ligament is in a characteristic location for focal  fatty deposition but the appearance is more focal and rounded than typically seen. This could also be further assessed at the time of MRI. Small hypervascular lesion in the posterior right liver likely vascular malformation, but could also be further assessed at MRI. 6. Probable tiny bilateral adrenal nodules. 7.  Aortic Atherosclerois (ICD10-170.0) 8.  Emphysema. ([id number]) The findings of this exam were discussed with the Internal Medicine clinical team at the time of study interpretation. Electronically Signed   By: Misty Stanley M.D.   On: 09/18/2017 10:50   Ct Biopsy  Result Date: 09/20/2017 CLINICAL DATA:  Lung mass, pulmonary nodules, hilar adenopathy. Pathologic fracture of L3 with paravertebral tumor. EXAM: CT GUIDED CORE BIOPSY OF PARAVERTEBRAL MASS ANESTHESIA/SEDATION: Intravenous Fentanyl and Versed were administered as conscious sedation during continuous monitoring of the patient's level of consciousness and physiological / cardiorespiratory status by the radiology RN, with a total moderate sedation time of 14 minutes. PROCEDURE: The procedure risks, benefits, and alternatives were explained to the patient. Questions regarding the procedure were encouraged and answered. The patient understands and consents to the procedure. Patient placed prone. Select axial scans through L3 were obtained, and the left paravertebral soft tissue component was localized and an appropriate skin entry site determined and marked. The operative field was prepped with chlorhexidinein a sterile fashion, and a sterile drape was applied covering the operative field. A sterile gown and sterile gloves were used for the procedure. Local anesthesia was provided with 1% Lidocaine. Under CT fluoroscopic guidance, a 17 gauge trocar needle was advanced to the margin of the lesion. Once needle tip position was confirmed, coaxial 18-gauge core biopsy samples were obtained, submitted in formalin to surgical pathology. The guide  needle was removed. Postprocedure scans show no hemorrhage or other apparent complication. The patient tolerated the procedure well. COMPLICATIONS: None immediate FINDINGS: Pathologic L3 compression fracture was again demonstrated. Left paravertebral tumor extension was noted. Representative core biopsy samples were obtained. IMPRESSION: 1. Technically successful core biopsy of left L3 paravertebral mass. Electronically Signed   By: Lucrezia Europe M.D.   On: 09/20/2017 14:12    ASSESSMENT: This is a very pleasant 72 years old white female recently diagnosed with a stage IV (T1b, N2, M1c) non-small cell lung cancer, adenocarcinoma presented with right upper lobe lung nodule in addition to extensive mediastinal lymphadenopathy as well as metastatic bone disease including compression fracture of L3 with epidural extension diagnosed in April 2019. The patient also has suspicious lesion in the cecum concerning for primary colorectal carcinoma.  Biomarker Findings Tumor Mutational Burden - TMB-Intermediate (15 Muts/Mb) Microsatellite status - MS-Stable Genomic Findings For a complete list of the genes assayed, please refer to the Appendix. ERRFI1 truncation exon 4 MYC amplification - equivocal? YIRS8N/I loss OEV0J5 splice site 009+3G>H [id number] splice site 169-6V>E MTAP loss RAD21 amplification - equivocal? TP53 H238f*48 8 Disease relevant genes with no reportable alterations: EGFR, KRAS, ALK, BRAF, MET, RET, ERBB2, ROS1  PDL1 Expression 80%.  PLAN: I had a lengthy discussion with the patient today about her current disease of stage, prognosis and treatment options. I personally and independently reviewed  the scan images and discussed the result and showed the images to the patient today. I recommended for the patient to complete the staging work-up by ordering MRI of the brain as well as PET scan to rule out any other metastatic disease. I explained to the patient that she has incurable condition  and all the treatment will be of palliative nature. I discussed with her the option of palliative care and hospice versus consideration of palliative treatment with either single agent immunotherapy with Keytruda or a combination of chemotherapy with carboplatin, Alimta and Keytruda.  The patient would like to proceed with the treatment but she would like to hold on the chemotherapy for now and start with single agent immunotherapy with Keytruda. I discussed with her the adverse effect of this treatment including but not limited to immunotherapy mediated skin rash, diarrhea, inflammation of the lung, kidney, liver, thyroid or other endocrine dysfunction. We will taper her dose of steroids slowly over the next 2 weeks.  She will start taking 4 mg p.o. daily for 1 week then 2 mg p.o. daily for 1 week until I see her back. I will arrange for the patient to have a chemotherapy education class before starting the first dose of her treatment. Her back pain has significantly improved and the patient is not currently on any pain medications.  She will continue with the palliative radiotherapy. For the suspicious colorectal neoplasm, the patient is scheduled to see gastroenterology soon for consideration of colonoscopy. The patient will come back for follow-up visit with the start of the first cycle of the immunotherapy on Fiorini 21, 2019. She was advised to call immediately if she has any concerning symptoms in the interval.  The patient voices understanding of current disease status and treatment options and is in agreement with the current care plan.  All questions were answered. The patient knows to call the clinic with any problems, questions or concerns. We can certainly see the patient much sooner if necessary.  Thank you so much for allowing me to participate in the care of Nattaly Yebra Celli. I will continue to follow up the patient with you and assist in her care.  I spent 55 minutes counseling the patient  face to face. The total time spent in the appointment was 80 minutes.  Disclaimer: This note was dictated with voice recognition software. Similar sounding words can inadvertently be transcribed and Masi not be corrected upon review.   Eilleen Kempf Lambright 10, 2019, 11:01 AM

## 2017-10-07 ENCOUNTER — Ambulatory Visit: Payer: Medicare Other

## 2017-10-07 ENCOUNTER — Telehealth: Payer: Self-pay | Admitting: *Deleted

## 2017-10-07 ENCOUNTER — Telehealth: Payer: Self-pay | Admitting: Medical Oncology

## 2017-10-07 ENCOUNTER — Telehealth: Payer: Self-pay | Admitting: Internal Medicine

## 2017-10-07 LAB — BASIC METABOLIC PANEL
BUN: 31 — AB (ref 4–21)
Creatinine: 0.4 — AB (ref 0.5–1.1)
GLUCOSE: 84
Potassium: 4.7 (ref 3.4–5.3)
SODIUM: 137 (ref 137–147)

## 2017-10-07 LAB — HEPATIC FUNCTION PANEL
ALT: 27 (ref 7–35)
AST: 6 — AB (ref 13–35)
Alkaline Phosphatase: 69 (ref 25–125)
BILIRUBIN, TOTAL: 0.3

## 2017-10-07 LAB — CBC AND DIFFERENTIAL
HCT: 45 (ref 36–46)
Hemoglobin: 14.7 (ref 12.0–16.0)
NEUTROS ABS: 8
Platelets: 148 — AB (ref 150–399)
WBC: 9.5

## 2017-10-07 NOTE — Telephone Encounter (Signed)
Spoke with RN at [facility] regarding the patients scheduled radiation treatment for today.  The facility states they were unaware of this appointment and asked that she be rescheduled for tomorrow.  L1 notified and they will call the facility to let them know of the new appointment time.  Will continue to follow as necessary.  Cori Razor, RN

## 2017-10-07 NOTE — Telephone Encounter (Signed)
Scheduled appt per 5/9 los - called patient - she is aware of appts added but does not know iof how to get to appts because she has no means of transportation. Relayed message to [employer]

## 2017-10-07 NOTE — Telephone Encounter (Signed)
Pt stated that Dr Julien Nordmann  decrease decadron and per his note  "to to 1 tab q day x 5 days then 1/2 tab qd until further notice". Pt also said she does not need any morphine and does not want it. I called to  Ok Edwards , NP .[facility] wil change decadron and talk to pt about pain meds.

## 2017-10-08 ENCOUNTER — Ambulatory Visit
Admission: RE | Admit: 2017-10-08 | Discharge: 2017-10-08 | Disposition: A | Discharge status: Home / Self Care | Payer: Medicare Other | Source: Ambulatory Visit | Attending: Radiation Oncology | Admitting: Radiation Oncology

## 2017-10-08 ENCOUNTER — Non-Acute Institutional Stay (SKILLED_NURSING_FACILITY): Payer: Medicare Other | Admitting: Adult Health

## 2017-10-08 ENCOUNTER — Encounter: Payer: Self-pay | Admitting: Adult Health

## 2017-10-08 DIAGNOSIS — C7951 Secondary malignant neoplasm of bone: Secondary | ICD-10-CM

## 2017-10-08 DIAGNOSIS — Z510 Encounter for antineoplastic radiation therapy: Secondary | ICD-10-CM | POA: Diagnosis not present

## 2017-10-08 DIAGNOSIS — M150 Primary generalized (osteo)arthritis: Secondary | ICD-10-CM

## 2017-10-08 DIAGNOSIS — C3491 Malignant neoplasm of unspecified part of right bronchus or lung: Secondary | ICD-10-CM | POA: Diagnosis not present

## 2017-10-08 DIAGNOSIS — C7952 Secondary malignant neoplasm of bone marrow: Secondary | ICD-10-CM | POA: Diagnosis not present

## 2017-10-08 DIAGNOSIS — M8448XP Pathological fracture, other site, subsequent encounter for fracture with malunion: Secondary | ICD-10-CM | POA: Diagnosis not present

## 2017-10-08 DIAGNOSIS — M159 Polyosteoarthritis, unspecified: Secondary | ICD-10-CM

## 2017-10-08 NOTE — Progress Notes (Signed)
Location:   [facility] Room Number: 107 A Place of Service:  SNF (31)   CODE STATUS: Full Code  No Known Allergies  Chief Complaint  Patient presents with  . Medical Management of Chronic Issues    Stage 4 lung cancer; bone malignancy; osteoarthritis; lumbar fracture. Weekly follow up for the first 30 days post hospitalization    HPI:  She is a 72 year old short term rehab patient being seen for the management of her chronic illnesses: lung cancer; bone mets; lumbar fracture osteoarthritis. She states that her back pain is well managed at this time. She is willing to continue to take her MS contin. She denies any changes in appetite; on anxiety. There are no nursing concerns at this time.   Past Medical History:  Diagnosis Date  . Arthritis    "possibly in some joints" (09/19/2017)  . Lower back pain     Past Surgical History:  Procedure Laterality Date  . NO PAST SURGERIES      Social History   Socioeconomic History  . Marital status: Married    Spouse name: Not on file  . Number of children: Not on file  . Years of education: Not on file  . Highest education level: Not on file  Occupational History  . Not on file  Social Needs  . Financial resource strain: Not on file  . Food insecurity:    Worry: Not on file    Inability: Not on file  . Transportation needs:    Medical: Not on file    Non-medical: Not on file  Tobacco Use  . Smoking status: Current Every Day Smoker    Packs/day: 2.00    Years: 46.00    Pack years: 92.00    Types: Cigarettes  . Smokeless tobacco: Never Used  Substance and Sexual Activity  . Alcohol use: No  . Drug use: No  . Sexual activity: Not Currently  Lifestyle  . Physical activity:    Days per week: Not on file    Minutes per session: Not on file  . Stress: Not on file  Relationships  . Social connections:    Talks on phone: Not on file    Gets together: Not on file    Attends religious service: Not on  file    Active member of club or organization: Not on file    Attends meetings of clubs or organizations: Not on file    Relationship status: Not on file  . Intimate partner violence:    Fear of current or ex partner: Not on file    Emotionally abused: Not on file    Physically abused: Not on file    Forced sexual activity: Not on file  Other Topics Concern  . Not on file  Social History Narrative  . Not on file   History reviewed. No pertinent family history.    VITAL SIGNS BP (!) 115/55   Pulse 68   Temp 98.3 F (36.8 C)   Resp 18   Ht 5\' 5"  (1.651 m)   Wt 150 lb (68 kg)   BMI 24.96 kg/m   Outpatient Encounter Medications as of 10/08/2017  Medication Sig  . cyclobenzaprine (FLEXERIL) 10 MG tablet Take 1 tablet (10 mg total) by mouth 3 (three) times daily as needed for muscle spasms.  Marland Kitchen dexamethasone (DECADRON) 6 MG tablet Take 6 mg by mouth daily. X 5 days then give 1/2 tablet by mouth daily routinely per Dr.  Mohamed  . diclofenac (VOLTAREN) 75 MG EC tablet Take 75 mg by mouth 2 (two) times daily.  Marland Kitchen ENSURE (ENSURE) Take 240 mLs by mouth 2 (two) times daily.  . Meclizine HCl (BONINE) 25 MG CHEW Chew 1 tablet by mouth every 8 (eight) hours as needed (dizziness).   . morphine (MS CONTIN) 15 MG 12 hr tablet Take 1 tablet (15 mg total) by mouth every 12 (twelve) hours.  . nicotine (NICODERM CQ - DOSED IN MG/24 HOURS) 14 mg/24hr patch Place 1 patch (14 mg total) onto the skin daily.  Marland Kitchen oxyCODONE (ROXICODONE) 15 MG immediate release tablet Take 1 tablet (15 mg total) by mouth every 3 (three) hours as needed.  . pantoprazole (PROTONIX) 40 MG tablet Take 1 tablet (40 mg total) by mouth 2 (two) times daily.  . polyethylene glycol (MIRALAX / GLYCOLAX) packet Take 17 g by mouth daily as needed for mild constipation.  . senna-docusate (SENOKOT-S) 8.6-50 MG tablet Take 1 tablet by mouth 2 (two) times daily.  . traMADol (ULTRAM) 50 MG tablet Take 1 tablet (50 mg total) by mouth every 6  (six) hours as needed.  . [DISCONTINUED] dexamethasone (DECADRON) 6 MG tablet Take 1 tablet (6 mg total) by mouth 3 (three) times daily. (Patient not taking: Reported on 10/08/2017)  . [DISCONTINUED] feeding supplement, ENSURE ENLIVE, (ENSURE ENLIVE) LIQD Take 237 mLs by mouth 2 (two) times daily between meals. (Patient not taking: Reported on 10/08/2017)   No facility-administered encounter medications on file as of 10/08/2017.      SIGNIFICANT DIAGNOSTIC EXAMS  PREVIOUS:   09-15-17: MRI lumbar spine:  1. 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 2. Moderate L3 pathologic fracture. Tumoral extends into epidural space resulting in moderate canal stenosis. 3. Mild canal stenosis L3-4 and L4-5. Neural foraminal narrowing L3-4 through L5-S1: Severe on the LEFT at L5-S1.  09-17-17: ct of chest abdomen and pelvis:  1. Multiple bilateral pulmonary nodules are associated with necrotic and relatively bulky mediastinal and right hilar lymphadenopathy. These imaging features are consistent with metastatic disease in a patient with recently demonstrated osseous metastatic involvement. 2. 10 mm spiculated nodule in the peripheral right upper lobe is the dominant lung nodule on today's study. This could represent a primary malignancy and account for the metastatic disease in the lungs and mediastinum, but please see below. 3. 2.6 cm irregular peripherally enhancing lesion in the cecum, highly concerning for primary colorectal neoplasm. Small para cecal lymph nodes and borderline enlarged ileocolic lymph node are concerning for metastatic disease. 4. 2 cm lesion in the lower pole of the left kidney appears to have irregular peripheral low level enhancement. A similar but smaller 12 mm lesion is identified in the upper pole the right kidney. These could potentially represent complex cysts, apparent peripheral enhancement raises concern for neoplasm. While metastatic disease to the kidneys is not  common, the bilateral involvement raises this question. Bilateral primary renal neoplasm also consideration. Dedicated abdominal MRI without and with contrast would likely prove helpful to further evaluate. 5. Low-density in the medial segment left liver adjacent to the falciform ligament is in a characteristic location for focal fatty deposition but the appearance is more focal and rounded than typically seen. This could also be further assessed at the time of MRI. Small hypervascular lesion in the posterior right liver likely vascular malformation, but could also be further assessed at MRI. 6. Probable tiny bilateral adrenal nodules. 7.  Aortic Atherosclerois (ICD10-170.0) 8.  Emphysema. (ICD10-J43.9)  09-20-17: ct  guided biopsy of paravertebral mass: Technically successful core biopsy of left L3 paravertebral mass.  NO NEW EXAMS     LABS REVIEWED; PREVIOUS:   09-17-17: wbc 14.3; hgb 15.8; hct 46.4; mcv 88.9 ;plt 257; glucose 140; bun 16; creat 0.76; k+ 4.2; na++ 136; ca 9.3; liver normal albumin 3.1 09-23-17: wbc 19.8; hgb 16.9; hct 506; mcv 89.9; plt 305; glucose 164; bun 26; creat 0.70; k+ 4.2; na++ 137; ca 9.4; liver normal albumin 3.0 09-30-17: wbc 15.5; hgb 15.9; hct 47.6; mcv 88.3 plt 225;  NO NEW LABS.   Review of Systems  Constitutional: Negative for malaise/fatigue.  Respiratory: Negative for cough and shortness of breath.   Cardiovascular: Negative for chest pain, palpitations and leg swelling.  Gastrointestinal: Negative for abdominal pain, constipation and heartburn.  Genitourinary:       Is incontinent of urine and stool   Musculoskeletal: Positive for back pain. Negative for joint pain and myalgias.       Back pain is controlled   Skin: Negative.   Neurological: Negative for dizziness.  Psychiatric/Behavioral: The patient is not nervous/anxious.    Physical Exam  Constitutional: She is oriented to person, place, and time. She appears well-developed and well-nourished. No  distress.  Neck: No thyromegaly present.  Cardiovascular: Normal rate, regular rhythm and intact distal pulses.  Murmur heard. 1/6  Pulmonary/Chest: Effort normal and breath sounds normal. No respiratory distress.  Abdominal: Soft. Bowel sounds are normal. She exhibits no distension. There is no tenderness.  Musculoskeletal: Normal range of motion. She exhibits no edema.  Lymphadenopathy:    She has no cervical adenopathy.  Neurological: She is alert and oriented to person, place, and time.  Skin: Skin is warm and dry. She is not diaphoretic.  Psychiatric: She has a normal mood and affect.       ASSESSMENT/ PLAN:  TODAY:   1.  Osteoarthritis of multiple joints: is without change will continue voltaren 75 mg twice daily   2. Poorly differentiated primary metastatic adenocarcinoma of lung cancer STAGE 4 and of lumbar spine with L3 pathological fracture/2 cm mass in proximal cecal wall concerning of colon cancer/2 cm lesion left kidney and 12 mm lesion right kidney cancer associated pain : will follow up with oncology for palliative radiation therapy; neurosurgery; GI: oncology. Has lumbar support brace but cannot tolerate at this time. Will continue decadron 6 mg  daily through 10-12-17 then 3 mg daily will continue oxycodone 15 mg every 3 hours as needed; will change flexeril to 10 mg every 8 hours as needed  and will continue robaxin 500 mg three times daily as needed. Will continue MS contin 15 mg twice daily will monitor    3. Protein calorie malnutrition; moderate: weight is 150 pounds; albumin 3.0; will continue supplements as directed;   PREVIOUS  4. Polycythemia: without change hgb is 15.9; will monitor  5. Current everyday smoker: is presently using nicotine patch 14 mg daily   6.  Gerd without esophagitis: stable will continue protonix 40 mg twice daily   7. Chronic constipation: stable will continue senna s twice daily and miralax daily as needed  8. Leukocytosis:  without change; steroid induced: wbc 15.5; will monitor    MD is aware of resident's narcotic use and is in agreement with current plan of care. We will attempt to wean resident as apropriate   Ok Edwards NP [facility]  Contact [phone] Monday through Friday 8am- 5pm  After hours call [phone]

## 2017-10-09 ENCOUNTER — Inpatient Hospital Stay: Payer: Medicare Other

## 2017-10-09 ENCOUNTER — Telehealth: Payer: Self-pay | Admitting: Internal Medicine

## 2017-10-09 NOTE — Telephone Encounter (Signed)
Called nursing home and spoke with Elzie Rings in regards to patients appts - they are aware of appt date and time.

## 2017-10-10 ENCOUNTER — Inpatient Hospital Stay: Payer: Medicare Other

## 2017-10-14 ENCOUNTER — Inpatient Hospital Stay: Payer: Medicare Other

## 2017-10-14 ENCOUNTER — Ambulatory Visit (HOSPITAL_COMMUNITY)
Admission: RE | Admit: 2017-10-14 | Discharge: 2017-10-14 | Disposition: A | Discharge status: Home / Self Care | Payer: No Typology Code available for payment source | Source: Ambulatory Visit | Attending: Internal Medicine | Admitting: Internal Medicine

## 2017-10-14 ENCOUNTER — Telehealth: Payer: Self-pay | Admitting: Medical Oncology

## 2017-10-14 ENCOUNTER — Encounter (HOSPITAL_COMMUNITY)
Admission: RE | Admit: 2017-10-14 | Discharge: 2017-10-14 | Disposition: A | Discharge status: Home / Self Care | Payer: No Typology Code available for payment source | Source: Ambulatory Visit | Attending: Internal Medicine | Admitting: Internal Medicine

## 2017-10-14 DIAGNOSIS — C7951 Secondary malignant neoplasm of bone: Secondary | ICD-10-CM | POA: Insufficient documentation

## 2017-10-14 DIAGNOSIS — I700 Atherosclerosis of aorta: Secondary | ICD-10-CM | POA: Diagnosis not present

## 2017-10-14 DIAGNOSIS — N289 Disorder of kidney and ureter, unspecified: Secondary | ICD-10-CM | POA: Diagnosis not present

## 2017-10-14 DIAGNOSIS — Z79899 Other long term (current) drug therapy: Secondary | ICD-10-CM | POA: Diagnosis not present

## 2017-10-14 DIAGNOSIS — K639 Disease of intestine, unspecified: Secondary | ICD-10-CM | POA: Insufficient documentation

## 2017-10-14 DIAGNOSIS — C7989 Secondary malignant neoplasm of other specified sites: Secondary | ICD-10-CM | POA: Diagnosis not present

## 2017-10-14 DIAGNOSIS — M8448XA Pathological fracture, other site, initial encounter for fracture: Secondary | ICD-10-CM | POA: Insufficient documentation

## 2017-10-14 DIAGNOSIS — C7972 Secondary malignant neoplasm of left adrenal gland: Secondary | ICD-10-CM | POA: Diagnosis not present

## 2017-10-14 DIAGNOSIS — C771 Secondary and unspecified malignant neoplasm of intrathoracic lymph nodes: Secondary | ICD-10-CM | POA: Diagnosis not present

## 2017-10-14 DIAGNOSIS — C7971 Secondary malignant neoplasm of right adrenal gland: Secondary | ICD-10-CM | POA: Diagnosis not present

## 2017-10-14 DIAGNOSIS — C3490 Malignant neoplasm of unspecified part of unspecified bronchus or lung: Secondary | ICD-10-CM

## 2017-10-14 DIAGNOSIS — C787 Secondary malignant neoplasm of liver and intrahepatic bile duct: Secondary | ICD-10-CM | POA: Diagnosis not present

## 2017-10-14 DIAGNOSIS — J432 Centrilobular emphysema: Secondary | ICD-10-CM | POA: Diagnosis not present

## 2017-10-14 DIAGNOSIS — R918 Other nonspecific abnormal finding of lung field: Secondary | ICD-10-CM | POA: Diagnosis not present

## 2017-10-14 LAB — GLUCOSE, CAPILLARY: Glucose-Capillary: 91 mg/dL (ref 65–99)

## 2017-10-14 MED ORDER — GADOBENATE DIMEGLUMINE 529 MG/ML IV SOLN: 15.0000 mL | Freq: Once | INTRAVENOUS | Status: DC | PRN

## 2017-10-14 MED ORDER — FLUDEOXYGLUCOSE F - 18 (FDG) INJECTION
7.2000 | Freq: Once | INTRAVENOUS | Status: AC | PRN
  Administered 2017-10-14: 7.2 via INTRAVENOUS

## 2017-10-14 NOTE — Telephone Encounter (Signed)
CT scan of the head with contrast.

## 2017-10-14 NOTE — Telephone Encounter (Signed)
Pt could not complete MRI brain, yelling  "Take me out ". Please r/s and consider sedation.

## 2017-10-14 NOTE — Progress Notes (Signed)
Attempted patient for MRI scan, slid her into bore of scanner pt yelled to be taked out, she refused to be scanned. Pt stated she is severely claustro and would not do the exam.

## 2017-10-15 ENCOUNTER — Inpatient Hospital Stay (HOSPITAL_BASED_OUTPATIENT_CLINIC_OR_DEPARTMENT_OTHER): Payer: Medicare Other | Admitting: Internal Medicine

## 2017-10-15 ENCOUNTER — Non-Acute Institutional Stay (SKILLED_NURSING_FACILITY): Payer: Medicare Other | Admitting: Adult Health

## 2017-10-15 ENCOUNTER — Telehealth: Payer: Self-pay | Admitting: *Deleted

## 2017-10-15 ENCOUNTER — Other Ambulatory Visit: Payer: Self-pay | Admitting: Medical Oncology

## 2017-10-15 ENCOUNTER — Inpatient Hospital Stay: Payer: Medicare Other

## 2017-10-15 ENCOUNTER — Telehealth: Payer: Self-pay | Admitting: Internal Medicine

## 2017-10-15 ENCOUNTER — Encounter: Payer: Self-pay | Admitting: Internal Medicine

## 2017-10-15 ENCOUNTER — Encounter: Payer: Self-pay | Admitting: Adult Health

## 2017-10-15 VITALS — BP 107/56 | HR 82 | Temp 98.2°F | Resp 17

## 2017-10-15 VITALS — BP 103/56 | HR 77 | Temp 98.2°F | Resp 17 | Ht 65.0 in

## 2017-10-15 DIAGNOSIS — C3490 Malignant neoplasm of unspecified part of unspecified bronchus or lung: Secondary | ICD-10-CM

## 2017-10-15 DIAGNOSIS — F17210 Nicotine dependence, cigarettes, uncomplicated: Secondary | ICD-10-CM | POA: Diagnosis not present

## 2017-10-15 DIAGNOSIS — C7972 Secondary malignant neoplasm of left adrenal gland: Secondary | ICD-10-CM | POA: Diagnosis not present

## 2017-10-15 DIAGNOSIS — Z5112 Encounter for antineoplastic immunotherapy: Secondary | ICD-10-CM | POA: Diagnosis present

## 2017-10-15 DIAGNOSIS — C7952 Secondary malignant neoplasm of bone marrow: Secondary | ICD-10-CM

## 2017-10-15 DIAGNOSIS — X58XXXS Exposure to other specified factors, sequela: Secondary | ICD-10-CM | POA: Diagnosis not present

## 2017-10-15 DIAGNOSIS — N281 Cyst of kidney, acquired: Secondary | ICD-10-CM | POA: Diagnosis not present

## 2017-10-15 DIAGNOSIS — E440 Moderate protein-calorie malnutrition: Secondary | ICD-10-CM

## 2017-10-15 DIAGNOSIS — M5126 Other intervertebral disc displacement, lumbar region: Secondary | ICD-10-CM | POA: Diagnosis not present

## 2017-10-15 DIAGNOSIS — C7971 Secondary malignant neoplasm of right adrenal gland: Secondary | ICD-10-CM

## 2017-10-15 DIAGNOSIS — C3491 Malignant neoplasm of unspecified part of right bronchus or lung: Secondary | ICD-10-CM

## 2017-10-15 DIAGNOSIS — M48061 Spinal stenosis, lumbar region without neurogenic claudication: Secondary | ICD-10-CM | POA: Diagnosis not present

## 2017-10-15 DIAGNOSIS — D751 Secondary polycythemia: Secondary | ICD-10-CM | POA: Insufficient documentation

## 2017-10-15 DIAGNOSIS — C771 Secondary and unspecified malignant neoplasm of intrathoracic lymph nodes: Secondary | ICD-10-CM | POA: Diagnosis not present

## 2017-10-15 DIAGNOSIS — M8448XP Pathological fracture, other site, subsequent encounter for fracture with malunion: Secondary | ICD-10-CM

## 2017-10-15 DIAGNOSIS — R590 Localized enlarged lymph nodes: Secondary | ICD-10-CM

## 2017-10-15 DIAGNOSIS — Z7189 Other specified counseling: Secondary | ICD-10-CM

## 2017-10-15 DIAGNOSIS — J439 Emphysema, unspecified: Secondary | ICD-10-CM

## 2017-10-15 DIAGNOSIS — C7951 Secondary malignant neoplasm of bone: Secondary | ICD-10-CM

## 2017-10-15 DIAGNOSIS — N83202 Unspecified ovarian cyst, left side: Secondary | ICD-10-CM | POA: Diagnosis not present

## 2017-10-15 DIAGNOSIS — I700 Atherosclerosis of aorta: Secondary | ICD-10-CM | POA: Diagnosis not present

## 2017-10-15 DIAGNOSIS — K219 Gastro-esophageal reflux disease without esophagitis: Secondary | ICD-10-CM | POA: Diagnosis not present

## 2017-10-15 DIAGNOSIS — M8448XA Pathological fracture, other site, initial encounter for fracture: Secondary | ICD-10-CM

## 2017-10-15 DIAGNOSIS — F17200 Nicotine dependence, unspecified, uncomplicated: Secondary | ICD-10-CM | POA: Diagnosis not present

## 2017-10-15 DIAGNOSIS — Z79899 Other long term (current) drug therapy: Secondary | ICD-10-CM

## 2017-10-15 DIAGNOSIS — E042 Nontoxic multinodular goiter: Secondary | ICD-10-CM | POA: Diagnosis not present

## 2017-10-15 DIAGNOSIS — E43 Unspecified severe protein-calorie malnutrition: Secondary | ICD-10-CM | POA: Insufficient documentation

## 2017-10-15 DIAGNOSIS — R5382 Chronic fatigue, unspecified: Secondary | ICD-10-CM

## 2017-10-15 DIAGNOSIS — R918 Other nonspecific abnormal finding of lung field: Secondary | ICD-10-CM

## 2017-10-15 LAB — CMP (CANCER CENTER ONLY)
ALBUMIN: 1.9 g/dL — AB (ref 3.5–5.0)
ALK PHOS: 98 U/L (ref 40–150)
ALT: 59 U/L — AB (ref 0–55)
ANION GAP: 7 (ref 3–11)
AST: 13 U/L (ref 5–34)
BUN: 21 mg/dL (ref 7–26)
CALCIUM: 8.2 mg/dL — AB (ref 8.4–10.4)
CO2: 25 mmol/L (ref 22–29)
CREATININE: 0.57 mg/dL — AB (ref 0.60–1.10)
Chloride: 105 mmol/L (ref 98–109)
GFR, Est AFR Am: >60 mL/min (ref >60)
GFR, Estimated: >60 mL/min (ref >60)
Glucose, Bld: 143 mg/dL — ABNORMAL HIGH (ref 70–140)
Potassium: 4 mmol/L (ref 3.5–5.1)
SODIUM: 137 mmol/L (ref 136–145)
Total Bilirubin: 0.3 mg/dL (ref 0.2–1.2)
Total Protein: 4.7 g/dL — ABNORMAL LOW (ref 6.4–8.3)

## 2017-10-15 LAB — CBC WITH DIFFERENTIAL (CANCER CENTER ONLY)
Basophils Absolute: 0 10*3/uL (ref 0.0–0.1)
Basophils Relative: 0 %
Eosinophils Absolute: 0.3 10*3/uL (ref 0.0–0.5)
Eosinophils Relative: 4 %
HEMATOCRIT: 41.4 % (ref 34.8–46.6)
Hemoglobin: 13.7 g/dL (ref 11.6–15.9)
LYMPHS ABS: 0.4 10*3/uL — AB (ref 0.9–3.3)
Lymphocytes Relative: 4 %
MCH: 29.1 pg (ref 25.1–34.0)
MCHC: 33.2 g/dL (ref 31.5–36.0)
MCV: 87.7 fL (ref 79.5–101.0)
MONO ABS: 0.2 10*3/uL (ref 0.1–0.9)
MONOS PCT: 3 %
NEUTROS ABS: 7.6 10*3/uL — AB (ref 1.5–6.5)
Neutrophils Relative %: 89 %
Platelet Count: 138 10*3/uL — ABNORMAL LOW (ref 145–400)
RBC: 4.72 MIL/uL (ref 3.70–5.45)
RDW: 13.7 % (ref 11.2–14.5)
Smear Review: 1
WBC Count: 8.5 10*3/uL (ref 3.9–10.3)

## 2017-10-15 LAB — TSH: TSH: 0.517 u[IU]/mL (ref 0.308–3.960)

## 2017-10-15 MED ORDER — SODIUM CHLORIDE 0.9 % IV SOLN
Freq: Once | INTRAVENOUS | Status: AC
  Administered 2017-10-15: 16:00:00 via INTRAVENOUS

## 2017-10-15 MED ORDER — SODIUM CHLORIDE 0.9 % IV SOLN
200.0000 mg | Freq: Once | INTRAVENOUS | Status: AC
  Administered 2017-10-15: 200 mg via INTRAVENOUS
  Filled 2017-10-15: qty 8

## 2017-10-15 NOTE — Patient Instructions (Signed)
[facility] Discharge Instructions for Patients Receiving Chemotherapy  Today you received the following chemotherapy agents :  Keytruda.  To help prevent nausea and vomiting after your treatment, we encourage you to take your nausea medication as prescribed.   If you develop nausea and vomiting that is not controlled by your nausea medication, call the clinic.   BELOW ARE SYMPTOMS THAT SHOULD BE REPORTED IMMEDIATELY:  *FEVER GREATER THAN 100.5 F  *CHILLS WITH OR WITHOUT FEVER  NAUSEA AND VOMITING THAT IS NOT CONTROLLED WITH YOUR NAUSEA MEDICATION  *UNUSUAL SHORTNESS OF BREATH  *UNUSUAL BRUISING OR BLEEDING  TENDERNESS IN MOUTH AND THROAT WITH OR WITHOUT PRESENCE OF ULCERS  *URINARY PROBLEMS  *BOWEL PROBLEMS  UNUSUAL RASH Items with * indicate a potential emergency and should be followed up as soon as possible.  Feel free to call the clinic should you have any questions or concerns. The clinic phone number is [phone].  Please show the CHEMO ALERT CARD at check-in to the Emergency Department and triage nurse.  

## 2017-10-15 NOTE — Progress Notes (Signed)
Location:   [facility] Room Number: 107 A Place of Service:  SNF (31)   CODE STATUS: Full Code  No Known Allergies  Chief Complaint  Patient presents with  . Medical Management of Chronic Issues    Lung cancer; smoker; gerd; polycythemia. Weekly follow up for first 30 days post hospitalization     HPI:  She is a 72 year old short term patient of this facility being seen for the management of her chronic illnesses: lung cancer; smoker; gerd; polycythemia. She denies any uncontrolled pain; no nausea; no constipation. There are no nursing concerns at this time.   Past Medical History:  Diagnosis Date  . Arthritis    "possibly in some joints" (09/19/2017)  . Lower back pain     Past Surgical History:  Procedure Laterality Date  . NO PAST SURGERIES      Social History   Socioeconomic History  . Marital status: Married    Spouse name: Not on file  . Number of children: Not on file  . Years of education: Not on file  . Highest education level: Not on file  Occupational History  . Not on file  Social Needs  . Financial resource strain: Not on file  . Food insecurity:    Worry: Not on file    Inability: Not on file  . Transportation needs:    Medical: Not on file    Non-medical: Not on file  Tobacco Use  . Smoking status: Current Every Day Smoker    Packs/day: 2.00    Years: 46.00    Pack years: 92.00    Types: Cigarettes  . Smokeless tobacco: Never Used  Substance and Sexual Activity  . Alcohol use: No  . Drug use: No  . Sexual activity: Not Currently  Lifestyle  . Physical activity:    Days per week: Not on file    Minutes per session: Not on file  . Stress: Not on file  Relationships  . Social connections:    Talks on phone: Not on file    Gets together: Not on file    Attends religious service: Not on file    Active member of club or organization: Not on file    Attends meetings of clubs or organizations: Not on file    Relationship  status: Not on file  . Intimate partner violence:    Fear of current or ex partner: Not on file    Emotionally abused: Not on file    Physically abused: Not on file    Forced sexual activity: Not on file  Other Topics Concern  . Not on file  Social History Narrative  . Not on file   History reviewed. No pertinent family history.    VITAL SIGNS BP (!) 115/55   Pulse 68   Temp 98.3 F (36.8 C)   Resp 18   Ht 5\' 5"  (1.651 m)   Wt 150 lb (68 kg)   BMI 24.96 kg/m   Outpatient Encounter Medications as of 10/15/2017  Medication Sig  . cyclobenzaprine (FLEXERIL) 10 MG tablet Take 1 tablet (10 mg total) by mouth 3 (three) times daily as needed for muscle spasms.  Marland Kitchen dexamethasone (DECADRON) 1 MG tablet Take 3 mg by mouth daily.  . diclofenac (VOLTAREN) 75 MG EC tablet Take 75 mg by mouth 2 (two) times daily.  Marland Kitchen ENSURE (ENSURE) Take 240 mLs by mouth 2 (two) times daily.  . Meclizine HCl (BONINE) 25 MG CHEW  Chew 1 tablet by mouth every 8 (eight) hours as needed (dizziness).   . morphine (MS CONTIN) 15 MG 12 hr tablet Take 1 tablet (15 mg total) by mouth every 12 (twelve) hours.  . nicotine (NICODERM CQ - DOSED IN MG/24 HOURS) 14 mg/24hr patch Place 1 patch (14 mg total) onto the skin daily.  Marland Kitchen oxyCODONE (ROXICODONE) 15 MG immediate release tablet Take 1 tablet (15 mg total) by mouth every 3 (three) hours as needed.  . pantoprazole (PROTONIX) 40 MG tablet Take 1 tablet (40 mg total) by mouth 2 (two) times daily.  . polyethylene glycol (MIRALAX / GLYCOLAX) packet Take 17 g by mouth daily as needed for mild constipation.  . senna-docusate (SENOKOT-S) 8.6-50 MG tablet Take 1 tablet by mouth 2 (two) times daily.  . traMADol (ULTRAM) 50 MG tablet Take 1 tablet (50 mg total) by mouth every 6 (six) hours as needed.   No facility-administered encounter medications on file as of 10/15/2017.      SIGNIFICANT DIAGNOSTIC EXAMS  PREVIOUS:   09-15-17: MRI lumbar spine:  1. Multiple osseous and  LEFT paraspinal muscle metastasis. 2. Moderate L3 pathologic fracture. Tumoral extends into epidural space resulting in moderate canal stenosis. 3. Mild canal stenosis L3-4 and L4-5. Neural foraminal narrowing L3-4 through L5-S1: Severe on the LEFT at L5-S1.  09-17-17: ct of chest abdomen and pelvis:  1. Multiple bilateral pulmonary nodules are associated with necrotic and relatively bulky mediastinal and right hilar lymphadenopathy. These imaging features are consistent with metastatic disease in a patient with recently demonstrated osseous metastatic involvement. 2. 10 mm spiculated nodule in the peripheral right upper lobe is the dominant lung nodule on today's study. This could represent a primary malignancy and account for the metastatic disease in the lungs and mediastinum, but please see below. 3. 2.6 cm irregular peripherally enhancing lesion in the cecum, highly concerning for primary colorectal neoplasm. Small para cecal lymph nodes and borderline enlarged ileocolic lymph node are concerning for metastatic disease. 4. 2 cm lesion in the lower pole of the left kidney appears to have irregular peripheral low level enhancement. A similar but smaller 12 mm lesion is identified in the upper pole the right kidney. These could potentially represent complex cysts, apparent peripheral enhancement raises concern for neoplasm. While metastatic disease to the kidneys is not common, the bilateral involvement raises this question. Bilateral primary renal neoplasm also consideration. Dedicated abdominal MRI without and with contrast would likely prove helpful to further evaluate. 5. Low-density in the medial segment left liver adjacent to the falciform ligament is in a characteristic location for focal fatty deposition but the appearance is more focal and rounded than typically seen. This could also be further assessed at the time of MRI. Small hypervascular lesion in the posterior right liver likely vascular  malformation, but could also be further assessed at MRI. 6. Probable tiny bilateral adrenal nodules. 7.  Aortic Atherosclerois (ICD10-170.0) 8.  Emphysema. (ICD10-J43.9)  09-20-17: ct guided biopsy of paravertebral mass: Technically successful core biopsy of left L3 paravertebral mass.  NO NEW EXAMS     LABS REVIEWED; PREVIOUS:   09-17-17: wbc 14.3; hgb 15.8; hct 46.4; mcv 88.9 ;plt 257; glucose 140; bun 16; creat 0.76; k+ 4.2; na++ 136; ca 9.3; liver normal albumin 3.1 09-23-17: wbc 19.8; hgb 16.9; hct 506; mcv 89.9; plt 305; glucose 164; bun 26; creat 0.70; k+ 4.2; na++ 137; ca 9.4; liver normal albumin 3.0 09-30-17: wbc 15.5; hgb 15.9; hct 47.6; mcv 88.3 plt 225;  NO NEW LABS.   Review of Systems  Constitutional: Negative for malaise/fatigue.  Respiratory: Negative for cough and shortness of breath.   Cardiovascular: Negative for chest pain, palpitations and leg swelling.  Gastrointestinal: Negative for abdominal pain, constipation and heartburn.  Musculoskeletal: Negative for back pain, joint pain and myalgias.  Skin: Negative.   Neurological: Negative for dizziness.  Psychiatric/Behavioral: The patient is not nervous/anxious.       Physical Exam  Constitutional: She is oriented to person, place, and time. She appears well-developed and well-nourished. No distress.  Neck: No thyromegaly present.  Cardiovascular: Normal rate, regular rhythm and intact distal pulses.  Murmur heard. 1/6  Pulmonary/Chest: Effort normal and breath sounds normal. No respiratory distress.  Abdominal: Soft. Bowel sounds are normal. She exhibits no distension. There is no tenderness.  Musculoskeletal: She exhibits no edema.  Is able to move all extremity   Lymphadenopathy:    She has no cervical adenopathy.  Neurological: She is alert and oriented to person, place, and time.  Skin: Skin is warm and dry. She is not diaphoretic.  Psychiatric: She has a normal mood and affect.    ASSESSMENT/  PLAN:  TODAY:   1. 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 Has lumbar support brace but cannot tolerate at this time. Will continue decadron  3 mg daily will continue oxycodone 15 mg every 3 hours as needed; will change flexeril to 10 mg every 8 hours as needed  and will continue robaxin 500 mg three times daily as needed. Will continue MS contin 15 mg twice daily will monitor    2. Polycythemia: without change hgb is 15.9; will monitor  3. Current everyday smoker: is presently using nicotine patch 14 mg daily   4.  Gerd without esophagitis: stable will continue protonix 40 mg twice daily   PREVIOUS  5. Chronic constipation: stable will continue senna s twice daily and miralax daily as needed  6. Leukocytosis: without change; steroid induced: wbc 15.5; will monitor  7.  Osteoarthritis of multiple joints: is without change will continue voltaren 75 mg twice daily   8. Protein calorie malnutrition; moderate: weight is 150 pounds; albumin 3.0; will continue supplements as directed;     MD is aware of resident's narcotic use and is in agreement with current plan of care. We will attempt to wean resident as apropriate   Ok Edwards NP [facility]  Contact [phone] Monday through Friday 8am- 5pm  After hours call [phone]

## 2017-10-15 NOTE — Telephone Encounter (Signed)
3 cycles already scheduled per 5/21 los.

## 2017-10-15 NOTE — Progress Notes (Signed)
    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  The patient also has suspicious lesion in the cecum concerning for primary colorectal carcinoma.  Biomarker Findings Tumor Mutational Burden - TMB-Intermediate (15 Muts/Mb) Microsatellite status - MS-Stable Genomic Findings For a complete list of the genes assayed, please refer to the Appendix. ERRFI1 truncation exon 4 MYC amplification - equivocal? CDKN2A/B loss MAP3K1 splice site 633+1G>A MRE11A splice site 315-2A>G MTAP loss RAD21 amplification - equivocal? TP53 [id number] 8 Disease relevant genes with no reportable alterations: EGFR, KRAS, ALK, BRAF, MET, RET, ERBB2, ROS1  PDL1 Expression 80%.   PRIOR THERAPY: Palliative radiotherapy to the metastatic bone disease at L3 with epidural extension under the care of Dr. Moody.  CURRENT THERAPY: Immunotherapy with Keytruda 200 mg IV every 3 weeks.  First dose Heinze 21, 2019.  INTERVAL HISTORY: Julia Nguyen 72 y.o. female returns to the clinic today for follow-up visit.  The patient came by EMS on stretcher because of the weakness in the lower extremities secondary to metastatic disease at the L3 with epidural extension.  She underwent palliative radiotherapy to this area under the care of Dr. Moody.  She denied having any chest pain, shortness breath, cough or hemoptysis.  She denied having any fever or chills.  She has no nausea, vomiting, diarrhea or constipation.  She recently underwent a PET scan and she was supposed to have MRI of the brain but she could not tolerate the procedure.  She is here  today for evaluation before starting the first cycle of her treatment with Keytruda.  MEDICAL HISTORY: Past Medical History:  Diagnosis Date  . Arthritis    "possibly in some joints" (09/19/2017)  . Lower back pain     ALLERGIES:  has No Known Allergies.  MEDICATIONS:  Current Outpatient Medications  Medication Sig Dispense Refill  . cyclobenzaprine (FLEXERIL) 10 MG tablet Take 1 tablet (10 mg total) by mouth 3 (three) times daily as needed for muscle spasms. 20 tablet 0  . dexamethasone (DECADRON) 1 MG tablet Take 3 mg by mouth daily.    . diclofenac (VOLTAREN) 75 MG EC tablet Take 75 mg by mouth 2 (two) times daily.    . ENSURE (ENSURE) Take 240 mLs by mouth 2 (two) times daily.    . Meclizine HCl (BONINE) 25 MG CHEW Chew 1 tablet by mouth every 8 (eight) hours as needed (dizziness).     . morphine (MS CONTIN) 15 MG 12 hr tablet Take 1 tablet (15 mg total) by mouth every 12 (twelve) hours. 60 tablet 0  . nicotine (NICODERM CQ - DOSED IN MG/24 HOURS) 14 mg/24hr patch Place 1 patch (14 mg total) onto the skin daily. 28 patch 0  . pantoprazole (PROTONIX) 40 MG tablet Take 1 tablet (40 mg total) by mouth 2 (two) times daily. 60 tablet 0  . polyethylene glycol (MIRALAX / GLYCOLAX) packet Take 17 g by mouth daily as needed for mild constipation. 14 each 0  . senna-docusate (SENOKOT-S) 8.6-50 MG tablet Take 1 tablet by mouth 2 (two) times daily.    . traMADol (ULTRAM) 50 MG tablet Take 1 tablet (50 mg total) by   mouth every 6 (six) hours as needed. 120 tablet 0  . oxyCODONE (ROXICODONE) 15 MG immediate release tablet Take 1 tablet (15 mg total) by mouth every 3 (three) hours as needed. (Patient not taking: Reported on 10/15/2017) 120 tablet 0   No current facility-administered medications for this visit.     SURGICAL HISTORY:  Past Surgical History:  Procedure Laterality Date  . NO PAST SURGERIES      REVIEW OF SYSTEMS:  Constitutional: positive for fatigue Eyes: negative Ears, nose,  mouth, throat, and face: negative Respiratory: negative Cardiovascular: negative Gastrointestinal: negative Genitourinary:negative Integument/breast: negative Hematologic/lymphatic: negative Musculoskeletal:positive for muscle weakness Neurological: positive for weakness Behavioral/Psych: negative Endocrine: negative Allergic/Immunologic: negative   PHYSICAL EXAMINATION: General appearance: alert, cooperative, fatigued and no distress Head: Normocephalic, without obvious abnormality, atraumatic Neck: no adenopathy, no JVD, supple, symmetrical, trachea midline and thyroid not enlarged, symmetric, no tenderness/mass/nodules Lymph nodes: Cervical, supraclavicular, and axillary nodes normal. Resp: clear to auscultation bilaterally Back: symmetric, no curvature. ROM normal. No CVA tenderness. Cardio: regular rate and rhythm, S1, S2 normal, no murmur, click, rub or gallop GI: soft, non-tender; bowel sounds normal; no masses,  no organomegaly Extremities: extremities normal, atraumatic, no cyanosis or edema Neurologic: Alert and oriented X 3, normal strength and tone. Normal symmetric reflexes. Normal coordination and gait  ECOG PERFORMANCE STATUS: 2 - Symptomatic, <50% confined to bed  Blood pressure (!) 103/56, pulse 77, temperature 98.2 F (36.8 C), temperature source Oral, resp. rate 17, height 5' 5" (1.651 m), SpO2 93 %.  LABORATORY DATA: Lab Results  Component Value Date   WBC 9.5 10/07/2017   HGB 14.7 10/07/2017   HCT 45 10/07/2017   MCV 88.3 09/30/2017   PLT 148 (A) 10/07/2017      Chemistry      Component Value Date/Time   NA 137 10/07/2017   K 4.7 10/07/2017   CL 104 09/30/2017 0414   CO2 23 09/30/2017 0414   BUN 31 (A) 10/07/2017   CREATININE 0.4 (A) 10/07/2017   CREATININE 0.58 09/30/2017 0414   GLU 84 10/07/2017      Component Value Date/Time   CALCIUM 9.0 09/30/2017 0414   ALKPHOS 69 10/07/2017   AST 6 (A) 10/07/2017   ALT 27 10/07/2017   BILITOT 0.6  09/23/2017 0833       RADIOGRAPHIC STUDIES: Ct Chest W Contrast  Result Date: 09/18/2017 CLINICAL DATA:  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 Evaluate for primary malignancy. EXAM: CT CHEST, ABDOMEN, AND PELVIS WITH CONTRAST TECHNIQUE: Multidetector CT imaging of the chest, abdomen and pelvis was performed following the standard protocol during bolus administration of intravenous contrast. CONTRAST:  100mL ISOVUE-300 IOPAMIDOL (ISOVUE-300) INJECTION 61% COMPARISON:  Lumbar spine MRI 09/15/2017 FINDINGS: CT CHEST FINDINGS Cardiovascular: The heart size is normal. No pericardial effusion. Coronary artery calcification is evident. Atherosclerotic calcification is noted in the wall of the thoracic aorta. Mediastinum/Nodes: 4.2 x 2.9 cm necrotic nodal mass identified in the precarinal station. 13 mm short axis subcarinal lymph node evident. Necrotic right hilar lymphadenopathy measures 2 cm in short axis. 9 mm short axis right hilar lymph node evident. Tiny hiatal hernia. The esophagus has normal imaging features. There is no axillary lymphadenopathy. Multiple thyroid nodules are evident bilaterally. Lungs/Pleura: Moderate changes of emphysema noted with upper lobe predominance. 10 x 9 mm spiculated nodule identified in the right upper lobe (image 29/series 4). Additional (approximately 20) smaller nodules are identified in the lungs bilaterally, ranging in size from   several mm up to about 5 mm. No focal airspace consolidation. No pleural effusion. Musculoskeletal: No lytic or sclerotic osseous abnormality identified in the thorax. CT ABDOMEN PELVIS FINDINGS Hepatobiliary: 10 mm focal area of low attenuation is identified in the medial segment left liver along the falciform ligament. This is in a characteristic location for focal fatty deposition, but more rounded and focal than typically seen in the setting of focal fat. 16 mm hypervascular lesion in the posterior right  liver (61/3) is associated with apparent early filling of a right hepatic vein branch. There is no evidence for gallstones, gallbladder wall thickening, or pericholecystic fluid. No intrahepatic or extrahepatic biliary dilation. Pancreas: No focal mass lesion. No dilatation of the main duct. No intraparenchymal cyst. No peripancreatic edema. Spleen: No splenomegaly. No focal mass lesion. Adrenals/Urinary Tract: Thickening of the adrenal glands noted bilaterally with potential tiny bilateral adrenal nodules measuring in the 7-8 mm size range. 12 mm lesion upper pole right kidney (6/8) has attenuation too high to be a simple cyst and has a suggestion of enhancement. 2 cm lesion in the lower pole the left kidney (70/3) has irregular margins and apparent irregular peripheral low level enhancement (see image 21/delayed series 8). No evidence for hydroureter. The urinary bladder appears normal for the degree of distention. Stomach/Bowel: Tiny hiatal hernia. Stomach otherwise unremarkable. Duodenum is normally positioned as is the ligament of Treitz. No small bowel wall thickening. No small bowel dilatation. The terminal ileum is normal. The appendix is normal. Cecal tip is directed cranially in the right lower quadrant with the tip of the cecum up under the right liver. 2.0 x 2.6 x 2.2 cm rim enhancing mass is identified in the wall of the cecum proximal to the ileocecal valve (although positioned cranially), see image 79/3 and image 43/coronal series 6). Diverticular changes are noted in the left colon without evidence of diverticulitis. Vascular/Lymphatic: There is abdominal aortic atherosclerosis without aneurysm. No gastrohepatic ligament lymphadenopathy. 9 mm short axis lymph node in the hepatoduodenal ligament is upper normal. No para-aortic lymphadenopathy. No pelvic sidewall lymphadenopathy. Lymph nodes are identified adjacent to the cecum (image 81/series 3 and there is an 8 mm short axis lymph node in the  ileocolic mesentery (83/3) worrisome for metastatic disease. Reproductive: Uterus unremarkable. 2.4 cm benign appearing cystic lesion identified in the left ovary. Right ovary unremarkable. Other: No intraperitoneal free fluid. Musculoskeletal: Left groin hernia contains only fat. Compression fracture at L3 again noted. Epidural and paraspinal tumor was better demonstrated on the previous lumbar spine MRI. IMPRESSION: 1. Multiple bilateral pulmonary nodules are associated with necrotic and relatively bulky mediastinal and right hilar lymphadenopathy. These imaging features are consistent with metastatic disease in a patient with recently demonstrated osseous metastatic involvement. 2. 10 mm spiculated nodule in the peripheral right upper lobe is the dominant lung nodule on today's study. This could represent a primary malignancy and account for the metastatic disease in the lungs and mediastinum, but please see below. 3. 2.6 cm irregular peripherally enhancing lesion in the cecum, highly concerning for primary colorectal neoplasm. Small para cecal lymph nodes and borderline enlarged ileocolic lymph node are concerning for metastatic disease. 4. 2 cm lesion in the lower pole of the left kidney appears to have irregular peripheral low level enhancement. A similar but smaller 12 mm lesion is identified in the upper pole the right kidney. These could potentially represent complex cysts, apparent peripheral enhancement raises concern for neoplasm. While metastatic disease to the kidneys is not common,   the bilateral involvement raises this question. Bilateral primary renal neoplasm also consideration. Dedicated abdominal MRI without and with contrast would likely prove helpful to further evaluate. 5. Low-density in the medial segment left liver adjacent to the falciform ligament is in a characteristic location for focal fatty deposition but the appearance is more focal and rounded than typically seen. This could also be  further assessed at the time of MRI. Small hypervascular lesion in the posterior right liver likely vascular malformation, but could also be further assessed at MRI. 6. Probable tiny bilateral adrenal nodules. 7.  Aortic Atherosclerois (ICD10-170.0) 8.  Emphysema. (ICD10-J43.9) The findings of this exam were discussed with the Internal Medicine clinical team at the time of study interpretation. Electronically Signed   By: Eric  Mansell M.D.   On: 09/18/2017 10:50   Mr Lumbar Spine Wo Contrast  Result Date: 09/15/2017 CLINICAL DATA:  Low back pain for 5 weeks, anterior leg pain. EXAM: MRI LUMBAR SPINE WITHOUT CONTRAST TECHNIQUE: Multiplanar, multisequence MR imaging of the lumbar spine was performed. No intravenous contrast was administered. COMPARISON:  Lumbar spine radiographs September 09, 2017 FINDINGS: SEGMENTATION: For the purposes of this report, the last well-formed intervertebral disc is reported as L5-S1. ALIGNMENT: Maintained lumbar lordosis. Minimal grade 1 L1-2 retrolisthesis. No spondylolysis. VERTEBRAE:Moderate L3 fracture approximate 50% central height loss. Diffusely low T1 signal L3 vertebral body extending into bilateral pedicles and LEFT transverse process. Similarly decreased signal 11 mm lesion inferior endplate of L2, 11 mm lesion superior endplate of L4 and 14 mm lesion RIGHT iliac bone. Mild T11-12, mild L4-5 and moderate L5-S1 disc height loss with desiccation and proportional chronic discogenic endplate changes. L2-3 and L3-4 disc edema. CONUS MEDULLARIS AND CAUDA EQUINA: Conus medullaris terminates at L2 and demonstrates normal morphology and signal characteristics. Cauda equina is normal. PARASPINAL AND OTHER SOFT TISSUES: Nonacute. Abnormal low T1, intermediate T2 11 mm mass LEFT paraspinal muscles at L3, 8 mm mass LEFT paraspinal muscles at L1. DISC LEVELS: T12-L1: No disc bulge, canal stenosis nor neural foraminal narrowing. L1-2: Retrolisthesis. Small broad-based disc bulge. No canal  stenosis or neural foraminal narrowing. L2-3: Moderate broad-based disc bulge. Tumoral extension through L3 vertebral body into ventral dural space measuring to 9 mm in AP dimension. Moderate canal stenosis at L3 with lateral recess effacement likely affecting the traversing LEFT greater than RIGHT L3 nerves. No neural foraminal narrowing. L3-4: Small broad-based disc bulge. Tumoral expansion through LEFT vertebral body. Mild canal stenosis with narrowed LEFT lateral recess likely affecting the traversing LEFT L4 nerve. Moderate bilateral neural foraminal narrowing. Exited LEFT L3 nerve impingement. L4-5: Moderate broad-based disc bulge asymmetric to LEFT. Mild facet arthropathy and ligamentum flavum redundancy. Mild canal stenosis. LEFT annular fissure. Mild RIGHT, mild to moderate LEFT neural foraminal narrowing. L5-S1: Moderate broad-based disc bulge asymmetric to LEFT versus LEFT extraforaminal disc protrusion contacting the exited LEFT L5 nerve. Moderate to severe facet arthropathy without canal stenosis. Severe LEFT neural foraminal narrowing. IMPRESSION: 1. 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 2. Moderate L3 pathologic fracture. Tumoral extends into epidural space resulting in moderate canal stenosis. 3. Mild canal stenosis L3-4 and L4-5. Neural foraminal narrowing L3-4 through L5-S1: Severe on the LEFT at L5-S1. 4. These results will be called to the ordering clinician or representative by the Radiologist Assistant, and communication documented in the PACS or zVision Dashboard. Electronically Signed   By: Courtnay  Bloomer M.D.   On: 09/15/2017 19:26   Ct Abdomen Pelvis W Contrast  Result Date: 09/18/2017 CLINICAL   DATA:  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 Evaluate for primary malignancy. EXAM: CT CHEST, ABDOMEN, AND PELVIS WITH CONTRAST TECHNIQUE: Multidetector CT imaging of the chest, abdomen and pelvis was performed following the standard  protocol during bolus administration of intravenous contrast. CONTRAST:  100mL ISOVUE-300 IOPAMIDOL (ISOVUE-300) INJECTION 61% COMPARISON:  Lumbar spine MRI 09/15/2017 FINDINGS: CT CHEST FINDINGS Cardiovascular: The heart size is normal. No pericardial effusion. Coronary artery calcification is evident. Atherosclerotic calcification is noted in the wall of the thoracic aorta. Mediastinum/Nodes: 4.2 x 2.9 cm necrotic nodal mass identified in the precarinal station. 13 mm short axis subcarinal lymph node evident. Necrotic right hilar lymphadenopathy measures 2 cm in short axis. 9 mm short axis right hilar lymph node evident. Tiny hiatal hernia. The esophagus has normal imaging features. There is no axillary lymphadenopathy. Multiple thyroid nodules are evident bilaterally. Lungs/Pleura: Moderate changes of emphysema noted with upper lobe predominance. 10 x 9 mm spiculated nodule identified in the right upper lobe (image 29/series 4). Additional (approximately 20) smaller nodules are identified in the lungs bilaterally, ranging in size from several mm up to about 5 mm. No focal airspace consolidation. No pleural effusion. Musculoskeletal: No lytic or sclerotic osseous abnormality identified in the thorax. CT ABDOMEN PELVIS FINDINGS Hepatobiliary: 10 mm focal area of low attenuation is identified in the medial segment left liver along the falciform ligament. This is in a characteristic location for focal fatty deposition, but more rounded and focal than typically seen in the setting of focal fat. 16 mm hypervascular lesion in the posterior right liver (61/3) is associated with apparent early filling of a right hepatic vein branch. There is no evidence for gallstones, gallbladder wall thickening, or pericholecystic fluid. No intrahepatic or extrahepatic biliary dilation. Pancreas: No focal mass lesion. No dilatation of the main duct. No intraparenchymal cyst. No peripancreatic edema. Spleen: No splenomegaly. No focal  mass lesion. Adrenals/Urinary Tract: Thickening of the adrenal glands noted bilaterally with potential tiny bilateral adrenal nodules measuring in the 7-8 mm size range. 12 mm lesion upper pole right kidney (6/8) has attenuation too high to be a simple cyst and has a suggestion of enhancement. 2 cm lesion in the lower pole the left kidney (70/3) has irregular margins and apparent irregular peripheral low level enhancement (see image 21/delayed series 8). No evidence for hydroureter. The urinary bladder appears normal for the degree of distention. Stomach/Bowel: Tiny hiatal hernia. Stomach otherwise unremarkable. Duodenum is normally positioned as is the ligament of Treitz. No small bowel wall thickening. No small bowel dilatation. The terminal ileum is normal. The appendix is normal. Cecal tip is directed cranially in the right lower quadrant with the tip of the cecum up under the right liver. 2.0 x 2.6 x 2.2 cm rim enhancing mass is identified in the wall of the cecum proximal to the ileocecal valve (although positioned cranially), see image 79/3 and image 43/coronal series 6). Diverticular changes are noted in the left colon without evidence of diverticulitis. Vascular/Lymphatic: There is abdominal aortic atherosclerosis without aneurysm. No gastrohepatic ligament lymphadenopathy. 9 mm short axis lymph node in the hepatoduodenal ligament is upper normal. No para-aortic lymphadenopathy. No pelvic sidewall lymphadenopathy. Lymph nodes are identified adjacent to the cecum (image 81/series 3 and there is an 8 mm short axis lymph node in the ileocolic mesentery (83/3) worrisome for metastatic disease. Reproductive: Uterus unremarkable. 2.4 cm benign appearing cystic lesion identified in the left ovary. Right ovary unremarkable. Other: No intraperitoneal free fluid.   Musculoskeletal: Left groin hernia contains only fat. Compression fracture at L3 again noted. Epidural and paraspinal tumor was better demonstrated on the  previous lumbar spine MRI. IMPRESSION: 1. Multiple bilateral pulmonary nodules are associated with necrotic and relatively bulky mediastinal and right hilar lymphadenopathy. These imaging features are consistent with metastatic disease in a patient with recently demonstrated osseous metastatic involvement. 2. 10 mm spiculated nodule in the peripheral right upper lobe is the dominant lung nodule on today's study. This could represent a primary malignancy and account for the metastatic disease in the lungs and mediastinum, but please see below. 3. 2.6 cm irregular peripherally enhancing lesion in the cecum, highly concerning for primary colorectal neoplasm. Small para cecal lymph nodes and borderline enlarged ileocolic lymph node are concerning for metastatic disease. 4. 2 cm lesion in the lower pole of the left kidney appears to have irregular peripheral low level enhancement. A similar but smaller 12 mm lesion is identified in the upper pole the right kidney. These could potentially represent complex cysts, apparent peripheral enhancement raises concern for neoplasm. While metastatic disease to the kidneys is not common, the bilateral involvement raises this question. Bilateral primary renal neoplasm also consideration. Dedicated abdominal MRI without and with contrast would likely prove helpful to further evaluate. 5. Low-density in the medial segment left liver adjacent to the falciform ligament is in a characteristic location for focal fatty deposition but the appearance is more focal and rounded than typically seen. This could also be further assessed at the time of MRI. Small hypervascular lesion in the posterior right liver likely vascular malformation, but could also be further assessed at MRI. 6. Probable tiny bilateral adrenal nodules. 7.  Aortic Atherosclerois (ICD10-170.0) 8.  Emphysema. ([id number]) The findings of this exam were discussed with the Internal Medicine clinical team at the time of study  interpretation. Electronically Signed   By: Misty Stanley M.D.   On: 09/18/2017 10:50   Nm Pet Image Initial (pi) Skull Base To Thigh  Result Date: 10/14/2017 CLINICAL DATA:  Initial treatment strategy for non-small cell lung cancer, based on L3 vertebral lesion biopsy on 09/19/2017 most compatible with metastatic lung adenocarcinoma. EXAM: NUCLEAR MEDICINE PET SKULL BASE TO THIGH TECHNIQUE: 7.2 mCi F-18 FDG was injected intravenously. Full-ring PET imaging was performed from the skull base to thigh after the radiotracer. CT data was obtained and used for attenuation correction and anatomic localization. Fasting blood glucose: 91 mg/dl COMPARISON:  09/17/2017 CT chest, abdomen and pelvis. FINDINGS: Mediastinal blood pool activity: SUV max 2.4 NECK: No hypermetabolic lymph nodes in the neck. Incidental CT findings: Enlarged heterogeneous thyroid gland without discrete thyroid nodules or thyroid hypermetabolism. CHEST: Spiculated solid peripheral right upper lobe 1.1 cm hypermetabolic nodule with max SUV 4.9 (series 8/image 19). Hypermetabolic right paratracheal adenopathy, with representative 2.6 cm right paratracheal node with max SUV 12.7 (series 4/image 66). Hypermetabolic bilateral hilar adenopathy. Representative 2.0 cm right hilar node with max SUV 20.5 (series 4/image 74). Representative left hilar node with max SUV 9.5, poorly visualized on the noncontrast CT images. Hypermetabolic nonenlarged right prevascular mediastinal nodes. Representative 0.7 cm right prevascular node with max SUV 8.7 (series 4/image 71). Small soft tissue metastasis to the posterior left chest wall musculature between the left scapula and left ribs with max SUV 8.8, not delineated on the noncontrast CT images. Incidental CT findings: At least 8 subcentimeter solid pulmonary nodules scattered in both lungs, largest 6 mm in the left upper lobe (series 8/image 40), below PET resolution. Severe  centrilobular emphysema with diffuse  bronchial wall thickening. Trace dependent left pleural effusion. Coronary atherosclerosis. Atherosclerotic nonaneurysmal thoracic aorta. Dilated main pulmonary artery (3.7 cm diameter). ABDOMEN/PELVIS: At least 4 hypermetabolic masses scattered in the liver. Representative 1.1 cm superior right liver lobe mass with max SUV 8.7 (series 4/image 97). Representative 1.0 cm inferior right liver lobe mass with max SUV 7.5 (series 4/image 118). Hypermetabolic bilateral adrenal masses measuring 1.4 cm with max SUV 16.3 on the right and 1.9 cm with max SUV 19.0 on the left. Previously described indeterminate hypodense renal cortical lesions in the upper right and posterior lower left kidney on 09/17/2017 CT are not accurately assessed on the PET images, although appear to demonstrate hypermetabolism. Hypermetabolic 3.0 cm cecal mass with max SUV 16.6 (series 4/image 137). Hypermetabolic 0.9 cm right mesenteric node with max SUV 11.0 (series 4/image 143). Hypermetabolic small soft tissue metastases in the left posterior paraspinal musculature and bilateral proximal thigh musculature, with representative left posterior paraspinal muscular soft tissue metastasis with max SUV 8.1 and representative muscular soft tissue metastasis medial to the proximal left femur with max SUV 5.8, not discretely visualized on the noncontrast CT images. Incidental CT findings: Atherosclerotic nonaneurysmal abdominal aorta. SKELETON: Numerous hypermetabolic lytic osseous lesions throughout the axial and proximal appendicular skeleton, with representative osseous lesions as follows: -Proximal left femoral metaphysis lesion with max SUV 14.2 -medial right iliac bone lesion with max SUV 17.9 -upper right scapular lesion with max SUV 6.1 -lateral left fourth rib lesion with max SUV 6.8 -posterior L4 vertebral lesion with max SUV 14.8 -posterior T2 vertebral lesion with max SUV 10.1 -right C5-6 facet lesion with max SUV 8.4 Incidental CT findings:  Redemonstration pathologic L3 vertebral compression fracture. IMPRESSION: 1. Hypermetabolic spiculated solid 1.1 cm peripheral right upper lobe pulmonary nodule, compatible with primary bronchogenic carcinoma. 2. Multiple (at least 8) subcentimeter pulmonary nodules scattered in both lungs, below PET resolution, suspicious for bilateral pulmonary metastases. 3. Hypermetabolic bilateral hilar and mediastinal nodal metastases. 4. Multiple small hypermetabolic liver metastases. 5. Hypermetabolic bilateral adrenal metastases. 6. Suggestion of hypermetabolism within the indeterminate upper right and lower left renal cortical lesions described on 09/17/2017 CT study, suggesting malignant lesions, either renal metastases or primary renal cell carcinomas. 7. Hypermetabolic 3.0 cm cecal mass, most compatible with primary colonic carcinoma. Hypermetabolic right mesenteric adenopathy suggest locoregional nodal metastasis from colonic carcinoma. 8. Widespread hypermetabolic lytic osseous metastases throughout the axial and proximal appendicular skeleton as detailed. Redemonstration of pathologic L3 vertebral compression fracture. 9. Multiple small hypermetabolic soft tissue metastases in the posterior chest wall, paraspinal and bilateral proximal thigh musculature. 10. Aortic Atherosclerosis (ICD10-I70.0) and Emphysema (ICD10-J43.9). Electronically Signed   By: Ilona Sorrel M.D.   On: 10/14/2017 15:02   Ct Biopsy  Result Date: 09/20/2017 CLINICAL DATA:  Lung mass, pulmonary nodules, hilar adenopathy. Pathologic fracture of L3 with paravertebral tumor. EXAM: CT GUIDED CORE BIOPSY OF PARAVERTEBRAL MASS ANESTHESIA/SEDATION: Intravenous Fentanyl and Versed were administered as conscious sedation during continuous monitoring of the patient's level of consciousness and physiological / cardiorespiratory status by the radiology RN, with a total moderate sedation time of 14 minutes. PROCEDURE: The procedure risks, benefits, and  alternatives were explained to the patient. Questions regarding the procedure were encouraged and answered. The patient understands and consents to the procedure. Patient placed prone. Select axial scans through L3 were obtained, and the left paravertebral soft tissue component was localized and an appropriate skin entry site determined and marked. The operative field was prepped with chlorhexidinein  a sterile fashion, and a sterile drape was applied covering the operative field. A sterile gown and sterile gloves were used for the procedure. Local anesthesia was provided with 1% Lidocaine. Under CT fluoroscopic guidance, a 17 gauge trocar needle was advanced to the margin of the lesion. Once needle tip position was confirmed, coaxial 18-gauge core biopsy samples were obtained, submitted in formalin to surgical pathology. The guide needle was removed. Postprocedure scans show no hemorrhage or other apparent complication. The patient tolerated the procedure well. COMPLICATIONS: None immediate FINDINGS: Pathologic L3 compression fracture was again demonstrated. Left paravertebral tumor extension was noted. Representative core biopsy samples were obtained. IMPRESSION: 1. Technically successful core biopsy of left L3 paravertebral mass. Electronically Signed   By: D  Hassell M.D.   On: 09/20/2017 14:12    ASSESSMENT AND PLAN: This is a very pleasant 71 years old white female recently diagnosed with a stage IV non-small cell lung cancer, adenocarcinoma with no actionable mutations but PDL 1 expression of 80%. The patient is status post palliative radiotherapy to the L3 vertebral body lesion and epidural tumor extension. She had a PET scan performed recently.  I personally and independently reviewed the scan images and discussed the result and showed the images to the patient today.  Unfortunately the PET scan showed very extensive disease involving the chest, mediastinal lymph nodes, liver, adrenals as well as  extensive bone metastasis.  It also showed the cecal mass with abdominal lymphadenopathy suspicious for another primary colorectal carcinoma. She was unable to tolerate MRI of the brain.  I will arrange for the patient to have CT scan of the head with contrast to rule out brain metastasis. For the cecal mass, she is supposed to have colonoscopy soon for further evaluation of this lesion. I recommended for the patient to proceed with the first cycle of her treatment with Keytruda 200 mg IV every 3 weeks.  She will have a chemoradiation class before the first dose of this treatment today. The patient will come back for follow-up visit in 3 weeks for evaluation before starting cycle #2. For the malnutrition and hypoalbuminemia, we will refer the patient to the dietitian at the cancer center for further evaluation and management. She was advised to call immediately if she has any concerning symptoms in the interval. The patient voices understanding of current disease status and treatment options and is in agreement with the current care plan.  All questions were answered. The patient knows to call the clinic with any problems, questions or concerns. We can certainly see the patient much sooner if necessary.  I spent 15 minutes counseling the patient face to face. The total time spent in the appointment was 25 minutes.  Disclaimer: This note was dictated with voice recognition software. Similar sounding words can inadvertently be transcribed and [place] not be corrected upon review.       

## 2017-10-15 NOTE — Telephone Encounter (Signed)
[place] spoke with Nurse for [place] gave order for decadron to be decreased to 2 mg/day per Dr Julien Nordmann.

## 2017-10-15 NOTE — Progress Notes (Signed)
Dr. Julien Nordmann reviewed all lab results today 10/15/17.   OK to treat per Dr. Julien Nordmann.

## 2017-10-15 NOTE — Progress Notes (Signed)
Proceed with treatment today. MD plans to decrease Dex dose from 3mg  daily to 1.5mg  daily. MD to notify nursing home of the change in dexamethasone dose.  B.Oland Arquette, PharmD, BCPS, BCOP

## 2017-10-22 ENCOUNTER — Encounter: Payer: Self-pay | Admitting: Adult Health

## 2017-10-22 ENCOUNTER — Other Ambulatory Visit: Payer: Self-pay | Admitting: Medical Oncology

## 2017-10-22 ENCOUNTER — Non-Acute Institutional Stay (SKILLED_NURSING_FACILITY): Payer: Medicare Other | Admitting: Adult Health

## 2017-10-22 DIAGNOSIS — K5909 Other constipation: Secondary | ICD-10-CM | POA: Diagnosis not present

## 2017-10-22 DIAGNOSIS — M150 Primary generalized (osteo)arthritis: Secondary | ICD-10-CM | POA: Diagnosis not present

## 2017-10-22 DIAGNOSIS — C3491 Malignant neoplasm of unspecified part of right bronchus or lung: Secondary | ICD-10-CM | POA: Diagnosis not present

## 2017-10-22 DIAGNOSIS — C7952 Secondary malignant neoplasm of bone marrow: Secondary | ICD-10-CM

## 2017-10-22 DIAGNOSIS — M159 Polyosteoarthritis, unspecified: Secondary | ICD-10-CM

## 2017-10-22 DIAGNOSIS — E43 Unspecified severe protein-calorie malnutrition: Secondary | ICD-10-CM

## 2017-10-22 DIAGNOSIS — C3490 Malignant neoplasm of unspecified part of unspecified bronchus or lung: Secondary | ICD-10-CM

## 2017-10-22 DIAGNOSIS — C7951 Secondary malignant neoplasm of bone: Secondary | ICD-10-CM

## 2017-10-22 NOTE — Progress Notes (Signed)
Location:   [facility] Room Number: 107 A Place of Service:  SNF (31)   CODE STATUS: Full Code  No Active Allergies    Chief Complaint  Patient presents with  . Medical Management of Chronic Issues    Lung cancer; osteoarthritis; constipation malnutrition     HPI:  She is a 72 year old short term rehab patient being seen for the management of her chronic illnesses: lung cancer; osteoarthritis; constipation; malnutrition. She denies any pain; no shortness of breath; is tolerating nicotine patch easily; no constipation; no changes in appetite. No nursing concerns at this time.   Past Medical History:  Diagnosis Date  . Arthritis    "possibly in some joints" (09/19/2017)  . Lower back pain     Past Surgical History:  Procedure Laterality Date  . NO PAST SURGERIES      Social History   Socioeconomic History  . Marital status: Married    Spouse name: Not on file  . Number of children: Not on file  . Years of education: Not on file  . Highest education level: Not on file  Occupational History  . Not on file  Social Needs  . Financial resource strain: Not on file  . Food insecurity:    Worry: Not on file    Inability: Not on file  . Transportation needs:    Medical: Not on file    Non-medical: Not on file  Tobacco Use  . Smoking status: Former Smoker    Packs/day: 2.00    Years: 46.00    Pack years: 92.00    Types: Cigarettes    Last attempt to quit: 09/17/2017    Years since quitting: 0.0  . Smokeless tobacco: Never Used  Substance and Sexual Activity  . Alcohol use: No  . Drug use: No  . Sexual activity: Not Currently  Lifestyle  . Physical activity:    Days per week: Not on file    Minutes per session: Not on file  . Stress: Not on file  Relationships  . Social connections:    Talks on phone: Not on file    Gets together: Not on file    Attends religious service: Not on file    Active member of club or organization: Not on file    Attends meetings of clubs or organizations: Not on file    Relationship status: Not on file  . Intimate partner violence:    Fear of current or ex partner: Not on file    Emotionally abused: Not on file    Physically abused: Not on file    Forced sexual activity: Not on file  Other Topics Concern  . Not on file  Social History Narrative  . Not on file   History reviewed. No pertinent family history.    VITAL SIGNS BP (!) 144/95   Pulse 92   Temp 97.6 F (36.4 C)   Resp 20   Ht 5\' 5"  (1.651 m)   Wt 150 lb (68 kg)   BMI 24.96 kg/m   Outpatient Encounter Medications as of 10/22/2017  Medication Sig  . cyclobenzaprine (FLEXERIL) 10 MG tablet Take 1 tablet (10 mg total) by mouth 3 (three) times daily as needed for muscle spasms.  Marland Kitchen dexamethasone (DECADRON) 2 MG tablet Take 2 mg by mouth daily.   . diclofenac (VOLTAREN) 75 MG EC tablet Take 75 mg by mouth 2 (two) times daily.  Marland Kitchen ENSURE (ENSURE) Take 240 mLs by mouth  2 (two) times daily.  . Meclizine HCl (BONINE) 25 MG CHEW Chew 1 tablet by mouth every 8 (eight) hours as needed (dizziness).   . morphine (MS CONTIN) 15 MG 12 hr tablet Take 1 tablet (15 mg total) by mouth every 12 (twelve) hours.  . nicotine (NICODERM CQ - DOSED IN MG/24 HOURS) 14 mg/24hr patch Place 1 patch (14 mg total) onto the skin daily.  Marland Kitchen oxyCODONE (ROXICODONE) 15 MG immediate release tablet Take 15 mg by mouth every 3 (three) hours as needed for pain.  . pantoprazole (PROTONIX) 40 MG tablet Take 1 tablet (40 mg total) by mouth 2 (two) times daily.  . polyethylene glycol (MIRALAX / GLYCOLAX) packet Take 17 g by mouth daily as needed for mild constipation.  . senna-docusate (SENOKOT-S) 8.6-50 MG tablet Take 1 tablet by mouth 2 (two) times daily.  . traMADol (ULTRAM) 50 MG tablet Take 1 tablet (50 mg total) by mouth every 6 (six) hours as needed.  . [DISCONTINUED] oxyCODONE (ROXICODONE) 15 MG immediate release tablet Take 1 tablet (15 mg total) by mouth every 3  (three) hours as needed. (Patient not taking: Reported on 10/15/2017)   No facility-administered encounter medications on file as of 10/22/2017.      SIGNIFICANT DIAGNOSTIC EXAMS  PREVIOUS:   09-15-17: MRI lumbar spine:  1. 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 2. Moderate L3 pathologic fracture. Tumoral extends into epidural space resulting in moderate canal stenosis. 3. Mild canal stenosis L3-4 and L4-5. Neural foraminal narrowing L3-4 through L5-S1: Severe on the LEFT at L5-S1.  09-17-17: ct of chest abdomen and pelvis:  1. Multiple bilateral pulmonary nodules are associated with necrotic and relatively bulky mediastinal and right hilar lymphadenopathy. These imaging features are consistent with metastatic disease in a patient with recently demonstrated osseous metastatic involvement. 2. 10 mm spiculated nodule in the peripheral right upper lobe is the dominant lung nodule on today's study. This could represent a primary malignancy and account for the metastatic disease in the lungs and mediastinum, but please see below. 3. 2.6 cm irregular peripherally enhancing lesion in the cecum, highly concerning for primary colorectal neoplasm. Small para cecal lymph nodes and borderline enlarged ileocolic lymph node are concerning for metastatic disease. 4. 2 cm lesion in the lower pole of the left kidney appears to have irregular peripheral low level enhancement. A similar but smaller 12 mm lesion is identified in the upper pole the right kidney. These could potentially represent complex cysts, apparent peripheral enhancement raises concern for neoplasm. While metastatic disease to the kidneys is not common, the bilateral involvement raises this question. Bilateral primary renal neoplasm also consideration. Dedicated abdominal MRI without and with contrast would likely prove helpful to further evaluate. 5. Low-density in the medial segment left liver adjacent to the falciform ligament is  in a characteristic location for focal fatty deposition but the appearance is more focal and rounded than typically seen. This could also be further assessed at the time of MRI. Small hypervascular lesion in the posterior right liver likely vascular malformation, but could also be further assessed at MRI. 6. Probable tiny bilateral adrenal nodules. 7.  Aortic Atherosclerois (ICD10-170.0) 8.  Emphysema. (ICD10-J43.9)  09-20-17: ct guided biopsy of paravertebral mass: Technically successful core biopsy of left L3 paravertebral mass.  NO NEW EXAMS     LABS REVIEWED; PREVIOUS:   09-17-17: wbc 14.3; hgb 15.8; hct 46.4; mcv 88.9 ;plt 257; glucose 140; bun 16; creat 0.76; k+ 4.2; na++ 136; ca 9.3; liver  normal albumin 3.1 09-23-17: wbc 19.8; hgb 16.9; hct 506; mcv 89.9; plt 305; glucose 164; bun 26; creat 0.70; k+ 4.2; na++ 137; ca 9.4; liver normal albumin 3.0 09-30-17: wbc 15.5; hgb 15.9; hct 47.6; mcv 88.3 plt 225;  TODAY:   10-07-17: wbc 9.5; hgb 14.7; hct 45; plt 148; glucose 84; bun 31; creat 0.4; k+ 4.7; na++ 137; liver normal  10-15-17: wbc 8.5; hgb 13.7; hct 41.4; plt 138; glucose 143; bun 21; creat 0.57; k+ 4.0; na++137 alt 59  albumin 0.517   Review of Systems  Constitutional: Negative for malaise/fatigue.  Respiratory: Negative for cough and shortness of breath.   Cardiovascular: Negative for chest pain, palpitations and leg swelling.  Gastrointestinal: Negative for abdominal pain, constipation and heartburn.  Musculoskeletal: Negative for back pain, joint pain and myalgias.  Skin: Negative.   Neurological: Negative for dizziness.  Psychiatric/Behavioral: The patient is not nervous/anxious.     Physical Exam  Constitutional: She is oriented to person, place, and time. She appears well-developed and well-nourished. No distress.  Neck: No thyromegaly present.  Cardiovascular: Normal rate, regular rhythm and intact distal pulses.  Murmur heard. 1/6  Pulmonary/Chest: Effort normal  and breath sounds normal. No respiratory distress.  Abdominal: Soft. Bowel sounds are normal. She exhibits no distension. There is no tenderness.  Musculoskeletal: Normal range of motion. She exhibits no edema.  Lymphadenopathy:    She has no cervical adenopathy.  Neurological: She is alert and oriented to person, place, and time.  Skin: Skin is warm and dry. She is not diaphoretic.  Psychiatric: She has a normal mood and affect.    ASSESSMENT/ PLAN:  TODAY:   1. Chronic constipation: stable will continue senna s twice daily and miralax daily as needed  2.  Osteoarthritis of multiple joints: is without change will continue voltaren 75 mg twice daily   3. Protein calorie malnutrition; severe: weight is 150 pounds; albumin 1.9 will continue supplements as directed;   4. 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  Will continue decadron 2 mg daily will continue oxycodone 15 mg every 3 hours as needed;  flexeril  10 mg every 8 hours as needed  and will continue robaxin 500 mg three times daily as needed. Will continue MS contin 15 mg twice daily will monitor   PREVIOUS    5. Polycythemia: without change hgb is 13.7; will monitor  6. Current everyday smoker: is presently using nicotine patch 14 mg daily   7.  Gerd without esophagitis: stable will continue protonix 40 mg twice daily     MD is aware of resident's narcotic use and is in agreement with current plan of care. We will attempt to wean resident as apropriate   Ok Edwards NP [facility]  Contact [phone] Monday through Friday 8am- 5pm  After hours call [phone]

## 2017-10-23 ENCOUNTER — Other Ambulatory Visit: Payer: Self-pay | Admitting: Medical Oncology

## 2017-10-23 DIAGNOSIS — C3491 Malignant neoplasm of unspecified part of right bronchus or lung: Secondary | ICD-10-CM

## 2017-10-23 DIAGNOSIS — C3490 Malignant neoplasm of unspecified part of unspecified bronchus or lung: Secondary | ICD-10-CM

## 2017-10-24 ENCOUNTER — Encounter: Payer: Self-pay | Admitting: Gastroenterology

## 2017-10-24 ENCOUNTER — Encounter: Payer: Self-pay | Admitting: Adult Health

## 2017-10-24 ENCOUNTER — Ambulatory Visit (INDEPENDENT_AMBULATORY_CARE_PROVIDER_SITE_OTHER): Payer: Medicare Other | Admitting: Gastroenterology

## 2017-10-24 VITALS — BP 108/58 | HR 102 | Resp 18

## 2017-10-24 DIAGNOSIS — R935 Abnormal findings on diagnostic imaging of other abdominal regions, including retroperitoneum: Secondary | ICD-10-CM

## 2017-10-24 DIAGNOSIS — K639 Disease of intestine, unspecified: Secondary | ICD-10-CM

## 2017-10-24 DIAGNOSIS — K6389 Other specified diseases of intestine: Secondary | ICD-10-CM

## 2017-10-24 NOTE — Progress Notes (Signed)
10/24/2017 Julia Nguyen [id number] 1946/04/23   HISTORY OF PRESENT ILLNESS:  This is a 72 year old female who was just diagnosed with stage IV non-small cell lung cancer with extensive disease.  Imaging including CT scan and PET scan also showed a 3 cm cecal mass.  She was referred here for colonoscopy.  Unfortunately she is here today on a stretcher as she has been unable to walk due to severe back pain from metastatic disease.  She tells me that she has been unable to walk since 4/22.  She received palliative radiotherapy to the L3 vertevral body lesion and epidural tumor extension.  She says that she did get up to the chair for about one hour yesterday without much pain.  Is receiving Keytruda 200 mg IV every 3 weeks with first dose just last week.  Says that she usually moves her bowels without any issues but with pain medication, etc recently she has been experiencing some constipation.  Denies rectal bleeding.  Never had colonoscopy in the past.     Past Medical History:  Diagnosis Date  . Arthritis    "possibly in some joints" (09/19/2017)  . Lower back pain    Past Surgical History:  Procedure Laterality Date  . NO PAST SURGERIES      reports that she quit smoking about 5 weeks ago. Her smoking use included cigarettes. She has a 92.00 pack-year smoking history. She has never used smokeless tobacco. She reports that she does not drink alcohol or use drugs. family history is not on file. Allergies  Allergen Reactions  . Morphine And Related Shortness Of Breath      Outpatient Encounter Medications as of 10/24/2017  Medication Sig  . cyclobenzaprine (FLEXERIL) 10 MG tablet Take 1 tablet (10 mg total) by mouth 3 (three) times daily as needed for muscle spasms.  Marland Kitchen dexamethasone (DECADRON) 2 MG tablet Take 2 mg by mouth daily.   . diclofenac (VOLTAREN) 75 MG EC tablet Take 75 mg by mouth 2 (two) times daily.  Marland Kitchen ENSURE (ENSURE) Take 240 mLs by mouth 2 (two) times daily.  .  Meclizine HCl (BONINE) 25 MG CHEW Chew 1 tablet by mouth every 8 (eight) hours as needed (dizziness).   . morphine (MS CONTIN) 15 MG 12 hr tablet Take 1 tablet (15 mg total) by mouth every 12 (twelve) hours.  . nicotine (NICODERM CQ - DOSED IN MG/24 HOURS) 14 mg/24hr patch Place 1 patch (14 mg total) onto the skin daily.  Marland Kitchen oxyCODONE (ROXICODONE) 15 MG immediate release tablet Take 15 mg by mouth every 3 (three) hours as needed for pain.  . pantoprazole (PROTONIX) 40 MG tablet Take 1 tablet (40 mg total) by mouth 2 (two) times daily.  . polyethylene glycol (MIRALAX / GLYCOLAX) packet Take 17 g by mouth daily as needed for mild constipation.  . senna-docusate (SENOKOT-S) 8.6-50 MG tablet Take 1 tablet by mouth 2 (two) times daily.  . traMADol (ULTRAM) 50 MG tablet Take 1 tablet (50 mg total) by mouth every 6 (six) hours as needed.   No facility-administered encounter medications on file as of 10/24/2017.      REVIEW OF SYSTEMS  : All other systems reviewed and negative except where noted in the History of Present Illness.   PHYSICAL EXAM: There were no vitals taken for this visit. General: Well developed white female in no acute distress; patient on stretcher. Head: Normocephalic and atraumatic Eyes:  Sclerae anicteric, conjunctiva pink. Ears: Normal  auditory acuity. Lungs: Clear throughout to auscultation; no increased WOB. Heart: Regular rate and rhythm; no M/R/G. Abdomen: Soft, non-distended.  BS present.  Non-tender. Musculoskeletal: Symmetrical with no gross deformities  Skin: No lesions on visible extremities Extremities: No edema  Neurological: Alert oriented x 4, grossly non-focal Psychological:  Alert and cooperative. Normal mood and affect  ASSESSMENT AND PLAN: *72 year old female who was just diagnosed with stage IV non-small cell lung cancer with extensive disease.  Imaging including CT scan and PET scan also showed a 3 cm cecal mass.  She was referred here for colonoscopy.   Unfortunately she is here today on a stretcher as she has been unable to walk due to severe back pain from metastatic disease.  She is completely mentally competent and we discussed extensive regarding colonoscopy today and all that is involved.  She has no GI complaints.  We have declined to schedule colonoscopy for now, which she is in agreement with.  If her pain and mobility improve then we will be glad to see her back and reconsider, but at this point she certainly is in no position to undergo colonoscopy.   CC:  No ref. provider found

## 2017-10-24 NOTE — Patient Instructions (Signed)
Return to office when mobility has improved to the point that you can withstand prep for colonoscopy.

## 2017-10-25 ENCOUNTER — Ambulatory Visit (HOSPITAL_COMMUNITY)
Admission: RE | Admit: 2017-10-25 | Discharge: 2017-10-25 | Disposition: A | Discharge status: Home / Self Care | Payer: Medicare Other | Source: Ambulatory Visit | Attending: Internal Medicine | Admitting: Internal Medicine

## 2017-10-25 ENCOUNTER — Telehealth: Payer: Self-pay | Admitting: *Deleted

## 2017-10-25 DIAGNOSIS — G939 Disorder of brain, unspecified: Secondary | ICD-10-CM | POA: Diagnosis not present

## 2017-10-25 DIAGNOSIS — C3490 Malignant neoplasm of unspecified part of unspecified bronchus or lung: Secondary | ICD-10-CM | POA: Diagnosis not present

## 2017-10-25 DIAGNOSIS — C3491 Malignant neoplasm of unspecified part of right bronchus or lung: Secondary | ICD-10-CM | POA: Insufficient documentation

## 2017-10-25 NOTE — Telephone Encounter (Signed)
"  [facility] calling to confirm receipt of today's CT head has crossed over and visible in Epic read by Dr. Pascal Lux.  Nothing of the impression requires intervention, just want to ensure receipt and notify provider."

## 2017-10-28 ENCOUNTER — Other Ambulatory Visit: Payer: Self-pay

## 2017-10-28 MED ORDER — MORPHINE SULFATE 15 MG PO TABS: 15.0000 mg | ORAL_TABLET | Freq: Two times a day (BID) | ORAL | 0 refills | Status: DC

## 2017-10-28 MED ORDER — OXYCODONE HCL 15 MG PO TABS: 15.0000 mg | ORAL_TABLET | ORAL | 0 refills | Status: AC | PRN

## 2017-10-28 NOTE — Telephone Encounter (Signed)
Rx faxed to [facility] (P) [phone], (F) [phone] 

## 2017-10-28 NOTE — Telephone Encounter (Signed)
She has some suspicious lesion in the brain concerning for early metastasis.  I will discuss with her when I see her next appointment.

## 2017-10-29 ENCOUNTER — Other Ambulatory Visit: Payer: Self-pay | Admitting: Neurosurgery

## 2017-10-29 ENCOUNTER — Encounter: Payer: Self-pay | Admitting: Gastroenterology

## 2017-10-29 ENCOUNTER — Encounter: Payer: Self-pay | Admitting: Radiation Oncology

## 2017-10-29 DIAGNOSIS — R935 Abnormal findings on diagnostic imaging of other abdominal regions, including retroperitoneum: Secondary | ICD-10-CM | POA: Insufficient documentation

## 2017-10-29 DIAGNOSIS — C7951 Secondary malignant neoplasm of bone: Secondary | ICD-10-CM

## 2017-10-29 NOTE — Progress Notes (Signed)
  Radiation Oncology         [phone]) [phone] ________________________________  Name: Julia Nguyen MRN: [id number]  Date: 10/29/2017  DOB: 1945-10-05  End of Treatment Note  Diagnosis:   Bone metastasis      Indication for treatment:  Palliative        Radiation treatment dates:   09/23/17 - 10/07/17  Site/dose:   Lumbar spine treated to 30 Gy with 10 fx of 3 Gy  Beams/energy:   Isodose / 15X  Narrative: The patient tolerated radiation treatment relatively well.   She did continue to endorse some discomfort in her back throughout treatment.  Plan: The patient has completed radiation treatment. The patient will return to radiation oncology clinic for routine followup in one month. I advised them to call or return sooner if they have any questions or concerns related to their recovery or treatment.  ------------------------------------------------  Jodelle Gross, MD, PhD  This document serves as a record of services personally performed by Kyung Rudd, MD. It was created on his behalf by Linward Natal, a trained medical scribe. The creation of this record is based on the scribe's personal observations and the provider's statements to them. This document has been checked and approved by the attending provider.

## 2017-10-30 ENCOUNTER — Encounter: Payer: Self-pay | Admitting: Adult Health

## 2017-10-30 ENCOUNTER — Telehealth: Payer: Self-pay | Admitting: Internal Medicine

## 2017-10-30 ENCOUNTER — Non-Acute Institutional Stay (SKILLED_NURSING_FACILITY): Payer: Medicare Other | Admitting: Adult Health

## 2017-10-30 DIAGNOSIS — C3491 Malignant neoplasm of unspecified part of right bronchus or lung: Secondary | ICD-10-CM

## 2017-10-30 DIAGNOSIS — C7951 Secondary malignant neoplasm of bone: Secondary | ICD-10-CM

## 2017-10-30 DIAGNOSIS — C7952 Secondary malignant neoplasm of bone marrow: Secondary | ICD-10-CM | POA: Diagnosis not present

## 2017-10-30 DIAGNOSIS — D751 Secondary polycythemia: Secondary | ICD-10-CM

## 2017-10-30 NOTE — Telephone Encounter (Signed)
Spoke w/ patient regarding change in time for her appt on 7/2.  Confirmed 6/11 appt.  She will stop by scheduling to pick up a new calendar on 6/11.

## 2017-10-30 NOTE — Progress Notes (Signed)
Reviewed and agree with documentation and assessment and plan. If patient has no specific lower GI complaints, colonoscopy is unlikely to benefit. Colorectal cancer screening is not indicated based on co-morbidities.  Damaris Hippo , MD

## 2017-10-30 NOTE — Progress Notes (Signed)
Location:   [facility] Room Number: 107 A Place of Service:  SNF (31)   CODE STATUS: Full Code  No Known Allergies  Chief Complaint  Patient presents with  . Medical Management of Chronic Issues    Lung cancer with mets. Polycythemia current smoker.     HPI:  She is a 72 year old long term resident of this facility being seen for the management of her chronic illnesses: metastatic lung cancer; polycythemia; smoker. She is not smoking and is doing well with her 14 mg nicotine patch. Her pain is presently being managed. She does have a poor appetite. She denies any anxiety or insomnia. She is complaining of some difficulty swallowing; feels as though food cannot get down easily.    Past Medical History:  Diagnosis Date  . Adenocarcinoma of right lung, stage 4 ([place]) 10/04/2017  . Arthritis    "possibly in some joints" (09/19/2017)  . Lower back pain     Past Surgical History:  Procedure Laterality Date  . NO PAST SURGERIES      Social History   Socioeconomic History  . Marital status: Married    Spouse name: Not on file  . Number of children: 3  . Years of education: Not on file  . Highest education level: Not on file  Occupational History  . Occupation: Retired  Scientific laboratory technician  . Financial resource strain: Not on file  . Food insecurity:    Worry: Not on file    Inability: Not on file  . Transportation needs:    Medical: Not on file    Non-medical: Not on file  Tobacco Use  . Smoking status: Former Smoker    Packs/day: 2.00    Years: 46.00    Pack years: 92.00    Types: Cigarettes    Last attempt to quit: 09/17/2017    Years since quitting: 0.1  . Smokeless tobacco: Never Used  Substance and Sexual Activity  . Alcohol use: No  . Drug use: No  . Sexual activity: Not Currently  Lifestyle  . Physical activity:    Days per week: Not on file    Minutes per session: Not on file  . Stress: Not on file  Relationships  . Social connections:   Talks on phone: Not on file    Gets together: Not on file    Attends religious service: Not on file    Active member of club or organization: Not on file    Attends meetings of clubs or organizations: Not on file    Relationship status: Not on file  . Intimate partner violence:    Fear of current or ex partner: Not on file    Emotionally abused: Not on file    Physically abused: Not on file    Forced sexual activity: Not on file  Other Topics Concern  . Not on file  Social History Narrative  . Not on file   Family History  Problem Relation Age of Onset  . Heart disease Mother   . Lung cancer Father       VITAL SIGNS BP 112/62   Pulse 92   Temp 97.6 F (36.4 C)   Resp 20   Ht 5\' 5"  (1.651 m)   Wt 150 lb (68 kg)   SpO2 97%   BMI 24.96 kg/m   Outpatient Encounter Medications as of 10/30/2017  Medication Sig  . cyclobenzaprine (FLEXERIL) 10 MG tablet Take 10 mg by mouth 3 (three)  times daily.  Marland Kitchen dexamethasone (DECADRON) 2 MG tablet Take 2 mg by mouth daily.   . diclofenac (VOLTAREN) 75 MG EC tablet Take 75 mg by mouth 2 (two) times daily.  Marland Kitchen ENSURE (ENSURE) Take 240 mLs by mouth 2 (two) times daily.  . Meclizine HCl (BONINE) 25 MG CHEW Chew 1 tablet by mouth every 8 (eight) hours as needed (dizziness).   . morphine (MSIR) 15 MG tablet Take 1 tablet (15 mg total) by mouth every 12 (twelve) hours.  . nicotine (NICODERM CQ - DOSED IN MG/24 HOURS) 14 mg/24hr patch Place 1 patch (14 mg total) onto the skin daily.  Marland Kitchen oxyCODONE (ROXICODONE) 15 MG immediate release tablet Take 1 tablet (15 mg total) by mouth every 3 (three) hours as needed for pain.  . pantoprazole (PROTONIX) 40 MG tablet Take 1 tablet (40 mg total) by mouth 2 (two) times daily.  . polyethylene glycol (MIRALAX / GLYCOLAX) packet Take 17 g by mouth daily as needed for mild constipation.  . senna-docusate (SENOKOT-S) 8.6-50 MG tablet Take 1 tablet by mouth 2 (two) times daily.  . traMADol (ULTRAM) 50 MG tablet Take 1  tablet (50 mg total) by mouth every 6 (six) hours as needed.  . [DISCONTINUED] cyclobenzaprine (FLEXERIL) 10 MG tablet Take 1 tablet (10 mg total) by mouth 3 (three) times daily as needed for muscle spasms. (Patient not taking: Reported on 10/30/2017)   No facility-administered encounter medications on file as of 10/30/2017.      SIGNIFICANT DIAGNOSTIC EXAMS  PREVIOUS:   09-15-17: MRI lumbar spine:  1. 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 2. Moderate L3 pathologic fracture. Tumoral extends into epidural space resulting in moderate canal stenosis. 3. Mild canal stenosis L3-4 and L4-5. Neural foraminal narrowing L3-4 through L5-S1: Severe on the LEFT at L5-S1.  09-17-17: ct of chest abdomen and pelvis:  1. Multiple bilateral pulmonary nodules are associated with necrotic and relatively bulky mediastinal and right hilar lymphadenopathy. These imaging features are consistent with metastatic disease in a patient with recently demonstrated osseous metastatic involvement. 2. 10 mm spiculated nodule in the peripheral right upper lobe is the dominant lung nodule on today's study. This could represent a primary malignancy and account for the metastatic disease in the lungs and mediastinum, but please see below. 3. 2.6 cm irregular peripherally enhancing lesion in the cecum, highly concerning for primary colorectal neoplasm. Small para cecal lymph nodes and borderline enlarged ileocolic lymph node are concerning for metastatic disease. 4. 2 cm lesion in the lower pole of the left kidney appears to have irregular peripheral low level enhancement. A similar but smaller 12 mm lesion is identified in the upper pole the right kidney. These could potentially represent complex cysts, apparent peripheral enhancement raises concern for neoplasm. While metastatic disease to the kidneys is not common, the bilateral involvement raises this question. Bilateral primary renal neoplasm also consideration.  Dedicated abdominal MRI without and with contrast would likely prove helpful to further evaluate. 5. Low-density in the medial segment left liver adjacent to the falciform ligament is in a characteristic location for focal fatty deposition but the appearance is more focal and rounded than typically seen. This could also be further assessed at the time of MRI. Small hypervascular lesion in the posterior right liver likely vascular malformation, but could also be further assessed at MRI. 6. Probable tiny bilateral adrenal nodules. 7.  Aortic Atherosclerois (ICD10-170.0) 8.  Emphysema. (ICD10-J43.9)  09-20-17: ct guided biopsy of paravertebral mass: Technically successful core  biopsy of left L3 paravertebral mass.  NO NEW EXAMS     LABS REVIEWED; PREVIOUS:   09-17-17: wbc 14.3; hgb 15.8; hct 46.4; mcv 88.9 ;plt 257; glucose 140; bun 16; creat 0.76; k+ 4.2; na++ 136; ca 9.3; liver normal albumin 3.1 09-23-17: wbc 19.8; hgb 16.9; hct 506; mcv 89.9; plt 305; glucose 164; bun 26; creat 0.70; k+ 4.2; na++ 137; ca 9.4; liver normal albumin 3.0 09-30-17: wbc 15.5; hgb 15.9; hct 47.6; mcv 88.3 plt 225; 10-07-17: wbc 9.5; hgb 14.7; hct 45; plt 148; glucose 84; bun 31; creat 0.4; k+ 4.7; na++ 137; liver normal  10-15-17: wbc 8.5; hgb 13.7; hct 41.4; plt 138; glucose 143; bun 21; creat 0.57; k+ 4.0; na++137 alt 59  albumin 1.9  NO NEW LABS    Review of Systems  Constitutional: Negative for malaise/fatigue.  Respiratory: Negative for cough and shortness of breath.   Cardiovascular: Negative for chest pain, palpitations and leg swelling.  Gastrointestinal: Negative for abdominal pain, constipation and heartburn.  Musculoskeletal: Negative for back pain, joint pain and myalgias.  Skin: Negative.   Neurological: Negative for dizziness.  Psychiatric/Behavioral: The patient is not nervous/anxious.     Physical Exam  Constitutional: She is oriented to person, place, and time. She appears well-developed and  well-nourished. No distress.  Neck: No thyromegaly present.  Cardiovascular: Normal rate, regular rhythm and intact distal pulses.  Murmur heard. 1/6  Pulmonary/Chest: Effort normal. No respiratory distress.  Breath sounds slightly diminished   Abdominal: Soft. Bowel sounds are normal. She exhibits no distension. There is no tenderness.  Musculoskeletal: Normal range of motion. She exhibits no edema.  Lymphadenopathy:    She has no cervical adenopathy.  Neurological: She is alert and oriented to person, place, and time.  Skin: Skin is warm and dry. She is not diaphoretic.  Psychiatric: She has a normal mood and affect.    ASSESSMENT/ PLAN:  TODAY:   1. Polycythemia: without change hgb is 13.7; will monitor  2. Current everyday smoker: is presently using nicotine patch 14 mg daily   3. 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  Will continue decadron 2 mg daily will continue oxycodone 15 mg every 3 hours as needed;  flexeril  10 mg every 8 hours   and will continue robaxin 500 mg three times daily as needed. Will continue MS contin 15 mg twice daily will monitor   PREVIOUS    4.  Gerd without esophagitis: stable will continue protonix 40 mg twice daily   5. Chronic constipation: stable will continue senna s twice daily and miralax daily as needed  6.  Osteoarthritis of multiple joints: is without change will continue voltaren 75 mg twice daily   7. Protein calorie malnutrition; severe: weight is 150 pounds; albumin 1.9 will continue supplements as directed;    Will have speech therapy see her for possible dysphagia.    MD is aware of resident's narcotic use and is in agreement with current plan of care. We will attempt to  wean resident as apropriate   Ok Edwards NP [facility]  Contact [phone] Monday through Friday 8am- 5pm  After hours call [phone]

## 2017-11-01 ENCOUNTER — Other Ambulatory Visit: Payer: Self-pay | Admitting: Neurosurgery

## 2017-11-01 DIAGNOSIS — C7931 Secondary malignant neoplasm of brain: Secondary | ICD-10-CM

## 2017-11-04 ENCOUNTER — Telehealth: Payer: Self-pay | Admitting: Medical Oncology

## 2017-11-04 NOTE — Telephone Encounter (Signed)
appt confirmed

## 2017-11-05 ENCOUNTER — Telehealth: Payer: Self-pay | Admitting: Oncology

## 2017-11-05 ENCOUNTER — Inpatient Hospital Stay: Payer: Medicare Other

## 2017-11-05 ENCOUNTER — Encounter: Payer: Self-pay | Admitting: Internal Medicine

## 2017-11-05 ENCOUNTER — Telehealth: Payer: Self-pay | Admitting: *Deleted

## 2017-11-05 ENCOUNTER — Inpatient Hospital Stay (HOSPITAL_BASED_OUTPATIENT_CLINIC_OR_DEPARTMENT_OTHER): Payer: Medicare Other | Admitting: Internal Medicine

## 2017-11-05 ENCOUNTER — Inpatient Hospital Stay: Payer: Medicare Other | Attending: Internal Medicine

## 2017-11-05 ENCOUNTER — Encounter: Payer: Self-pay | Admitting: *Deleted

## 2017-11-05 VITALS — BP 113/53 | HR 90 | Temp 97.9°F | Resp 18

## 2017-11-05 DIAGNOSIS — M1990 Unspecified osteoarthritis, unspecified site: Secondary | ICD-10-CM | POA: Diagnosis not present

## 2017-11-05 DIAGNOSIS — C771 Secondary and unspecified malignant neoplasm of intrathoracic lymph nodes: Secondary | ICD-10-CM

## 2017-11-05 DIAGNOSIS — J439 Emphysema, unspecified: Secondary | ICD-10-CM

## 2017-11-05 DIAGNOSIS — C7951 Secondary malignant neoplasm of bone: Secondary | ICD-10-CM | POA: Diagnosis not present

## 2017-11-05 DIAGNOSIS — Z79899 Other long term (current) drug therapy: Secondary | ICD-10-CM | POA: Insufficient documentation

## 2017-11-05 DIAGNOSIS — C7971 Secondary malignant neoplasm of right adrenal gland: Secondary | ICD-10-CM | POA: Diagnosis not present

## 2017-11-05 DIAGNOSIS — Z5112 Encounter for antineoplastic immunotherapy: Secondary | ICD-10-CM | POA: Insufficient documentation

## 2017-11-05 DIAGNOSIS — C3491 Malignant neoplasm of unspecified part of right bronchus or lung: Secondary | ICD-10-CM

## 2017-11-05 DIAGNOSIS — C3411 Malignant neoplasm of upper lobe, right bronchus or lung: Secondary | ICD-10-CM

## 2017-11-05 DIAGNOSIS — C781 Secondary malignant neoplasm of mediastinum: Secondary | ICD-10-CM

## 2017-11-05 DIAGNOSIS — Z923 Personal history of irradiation: Secondary | ICD-10-CM | POA: Insufficient documentation

## 2017-11-05 DIAGNOSIS — E46 Unspecified protein-calorie malnutrition: Secondary | ICD-10-CM

## 2017-11-05 DIAGNOSIS — R5383 Other fatigue: Secondary | ICD-10-CM | POA: Diagnosis not present

## 2017-11-05 DIAGNOSIS — C7972 Secondary malignant neoplasm of left adrenal gland: Secondary | ICD-10-CM | POA: Insufficient documentation

## 2017-11-05 DIAGNOSIS — R5382 Chronic fatigue, unspecified: Secondary | ICD-10-CM

## 2017-11-05 LAB — CBC WITH DIFFERENTIAL (CANCER CENTER ONLY)
Basophils Absolute: 0.1 10*3/uL (ref 0.0–0.1)
Basophils Relative: 1 %
EOS PCT: 2 %
Eosinophils Absolute: 0.2 10*3/uL (ref 0.0–0.5)
HEMATOCRIT: 35.6 % (ref 34.8–46.6)
Hemoglobin: 12 g/dL (ref 11.6–15.9)
LYMPHS PCT: 8 %
Lymphs Abs: 0.7 10*3/uL — ABNORMAL LOW (ref 0.9–3.3)
MCH: 29.2 pg (ref 25.1–34.0)
MCHC: 33.8 g/dL (ref 31.5–36.0)
MCV: 86.5 fL (ref 79.5–101.0)
MONOS PCT: 8 %
Monocytes Absolute: 0.7 10*3/uL (ref 0.1–0.9)
NEUTROS ABS: 7.4 10*3/uL — AB (ref 1.5–6.5)
Neutrophils Relative %: 81 %
Platelet Count: 208 10*3/uL (ref 145–400)
RBC: 4.12 MIL/uL (ref 3.70–5.45)
RDW: 15 % — ABNORMAL HIGH (ref 11.2–14.5)
WBC Count: 9.1 10*3/uL (ref 3.9–10.3)

## 2017-11-05 LAB — CMP (CANCER CENTER ONLY)
ALT: 62 U/L — ABNORMAL HIGH (ref 0–55)
ANION GAP: 8 (ref 3–11)
AST: 27 U/L (ref 5–34)
Albumin: 1.9 g/dL — ABNORMAL LOW (ref 3.5–5.0)
Alkaline Phosphatase: 138 U/L (ref 40–150)
BILIRUBIN TOTAL: 0.4 mg/dL (ref 0.2–1.2)
BUN: 27 mg/dL — AB (ref 7–26)
CO2: 27 mmol/L (ref 22–29)
Calcium: 8.4 mg/dL (ref 8.4–10.4)
Chloride: 105 mmol/L (ref 98–109)
Creatinine: 0.83 mg/dL (ref 0.60–1.10)
Glucose, Bld: 95 mg/dL (ref 70–140)
Potassium: 4.1 mmol/L (ref 3.5–5.1)
Sodium: 140 mmol/L (ref 136–145)
TOTAL PROTEIN: 4.5 g/dL — AB (ref 6.4–8.3)

## 2017-11-05 LAB — TSH: TSH: 0.498 u[IU]/mL (ref 0.308–3.960)

## 2017-11-05 MED ORDER — SODIUM CHLORIDE 0.9 % IV SOLN
200.0000 mg | Freq: Once | INTRAVENOUS | Status: AC
  Administered 2017-11-05: 200 mg via INTRAVENOUS
  Filled 2017-11-05: qty 8

## 2017-11-05 MED ORDER — SODIUM CHLORIDE 0.9 % IV SOLN
Freq: Once | INTRAVENOUS | Status: AC
  Administered 2017-11-05: 10:00:00 via INTRAVENOUS

## 2017-11-05 NOTE — Telephone Encounter (Signed)
Scheduled appt per 6/11 los - pt to get an updated schedule next visit.  

## 2017-11-05 NOTE — Progress Notes (Signed)
Pt presented on stretcher with MAR from [place] to reconcile with pt and unable to determine when last meds were given.

## 2017-11-05 NOTE — Telephone Encounter (Signed)
CALLED PATIENT TO ALTER FU PER ALISON PERKINS, MOVED APPT. TO 3:30 PM PER ALISON PERKINS, LVM FOR A RETURN CALL

## 2017-11-05 NOTE — Progress Notes (Signed)
Nutrition Assessment  72 year old female with stage IV lung cancer with metastatic disease to lumbar spine, L3 fracture. Patient also with suspicious lesion in cecum concerning for primary colorectal carcinoma. Past medical history of osteoartritis, GERD. Patient receiving Beryle Flock, s/p radiotherapy to L3 vertebral lesion.   Met with patient during infusion.  Patient reports that she does not like food at [facility].  "They serve food that I wouldn't feed a dog."  Reports that for breakfast she normally eats oatmeal or cereal with eggs and either bacon or sausage, coffee and whole milk.  Lunch is hit or miss if she likes what they are preparing (sometimes spaghetti, meat and vegetables, soup and sandwich).  Supper her husband generally gets take out and they eat together.  Reports that she has snacks in her room (cheese and crackers, peanut butter).  Reports that she gets a shake on her tray but she does not drink it, "taste like chalk." Does like ensure.    Reports that she is participating in PT and walked recently for the first time since 4/22  Nutrition Focused Physical Exam: deferred  Medications: decadron, protonix, miralax, senokot  Labs: reviewed  Anthropometrics:   Height: 65 inches Weight: 150 lb per chart.  Patient reports has not been weighed recently UBW: does not think she has lost weight BMI: 24   Estimated Energy Needs  Kcals: 1700-2040 calories Protein: 85-102 g Fluid: 2 L/d  NUTRITION DIAGNOSIS: Inadequate oral intake related to dislike of food at rehab facility as evidenced by patient unable to drink nutrition shake provided and often skipping lunch.    MALNUTRITION DIAGNOSIS: continue to monitor   INTERVENTION:  Encouraged patient to reach out to Dietitian at rehab regarding switching nutrition shake.  Gave coupons for ensure to stock in room Encouraged good sources of protein. Fact sheet given to patient    MONITORING, EVALUATION, GOAL: weight trends,  intake   NEXT VISIT: July 23 during infusion  Jamile Rekowski B. Zenia Resides, [place], [place] Registered Dietitian [phone] (pager)

## 2017-11-05 NOTE — Patient Instructions (Addendum)
[facility] Discharge Instructions for Patients Receiving Chemotherapy  PER DR [facility] PATIENT NOT TO TAKE STEROID (DECADRON) ANYMORE  Today you received the following chemotherapy agents :  Keytruda.  To help prevent nausea and vomiting after your treatment, we encourage you to take your nausea medication as prescribed.   If you develop nausea and vomiting that is not controlled by your nausea medication, call the clinic.   BELOW ARE SYMPTOMS THAT SHOULD BE REPORTED IMMEDIATELY:  *FEVER GREATER THAN 100.5 F  *CHILLS WITH OR WITHOUT FEVER  NAUSEA AND VOMITING THAT IS NOT CONTROLLED WITH YOUR NAUSEA MEDICATION  *UNUSUAL SHORTNESS OF BREATH  *UNUSUAL BRUISING OR BLEEDING  TENDERNESS IN MOUTH AND THROAT WITH OR WITHOUT PRESENCE OF ULCERS  *URINARY PROBLEMS  *BOWEL PROBLEMS  UNUSUAL RASH Items with * indicate a potential emergency and should be followed up as soon as possible.  Feel free to call the clinic should you have any questions or concerns. The clinic phone number is [phone].  Please show the [place] at check-in to the Emergency Department and triage nurse.  Pembrolizumab injection Beryle Flock) What is this medicine? PEMBROLIZUMAB (pem broe liz ue mab) is a monoclonal antibody. It is used to treat melanoma, head and neck cancer, Hodgkin lymphoma, non-small cell lung cancer, urothelial cancer, stomach cancer, and cancers that have a certain genetic condition. This medicine Elster be used for other purposes; ask your health care provider or pharmacist if you have questions. COMMON BRAND NAME(S): Keytruda What should I tell my health care provider before I take this medicine? They need to know if you have any of these conditions: -diabetes -immune system problems -inflammatory bowel disease -liver disease -lung or breathing disease -lupus -organ transplant -an unusual or allergic reaction to pembrolizumab, other medicines, foods, dyes, or  preservatives -pregnant or trying to get pregnant -breast-feeding How should I use this medicine? This medicine is for infusion into a vein. It is given by a health care professional in a hospital or clinic setting. A special MedGuide will be given to you before each treatment. Be sure to read this information carefully each time. Talk to your pediatrician regarding the use of this medicine in children. While this drug Neises be prescribed for selected conditions, precautions do apply. Overdosage: If you think you have taken too much of this medicine contact a poison control center or emergency room at once. NOTE: This medicine is only for you. Do not share this medicine with others. What if I miss a dose? It is important not to miss your dose. Call your doctor or health care professional if you are unable to keep an appointment. What Neary interact with this medicine? Interactions have not been studied. Give your health care provider a list of all the medicines, herbs, non-prescription drugs, or dietary supplements you use. Also tell them if you smoke, drink alcohol, or use illegal drugs. Some items [place] interact with your medicine. This list Vandoren not describe all possible interactions. Give your health care provider a list of all the medicines, herbs, non-prescription drugs, or dietary supplements you use. Also tell them if you smoke, drink alcohol, or use illegal drugs. Some items Hlavaty interact with your medicine. What should I watch for while using this medicine? Your condition will be monitored carefully while you are receiving this medicine. You Mcconnon need blood work done while you are taking this medicine. Do not become pregnant while taking this medicine or for 4 months after stopping it. Women  should inform their doctor if they wish to become pregnant or think they might be pregnant. There is a potential for serious side effects to an unborn child. Talk to your health care professional or pharmacist  for more information. Do not breast-feed an infant while taking this medicine or for 4 months after the last dose. What side effects Haley I notice from receiving this medicine? Side effects that you should report to your doctor or health care professional as soon as possible: -allergic reactions like skin rash, itching or hives, swelling of the face, lips, or tongue -bloody or black, tarry -breathing problems -changes in vision -chest pain -chills -constipation -cough -dizziness or feeling faint or lightheaded -fast or irregular heartbeat -fever -flushing -hair loss -low blood counts - this medicine Lim decrease the number of white blood cells, red blood cells and platelets. You Tabron be at increased risk for infections and bleeding. -muscle pain -muscle weakness -persistent headache -signs and symptoms of high blood sugar such as dizziness; dry mouth; dry skin; fruity breath; nausea; stomach pain; increased hunger or thirst; increased urination -signs and symptoms of kidney injury like trouble passing urine or change in the amount of urine -signs and symptoms of liver injury like dark urine, light-colored stools, loss of appetite, nausea, right upper belly pain, yellowing of the eyes or skin -stomach pain -sweating -weight loss Side effects that usually do not require medical attention (report to your doctor or health care professional if they continue or are bothersome): -decreased appetite -diarrhea -tiredness This list Nardelli not describe all possible side effects. Call your doctor for medical advice about side effects. You Seigler report side effects to FDA at 1-800-FDA-1088. Where should I keep my medicine? This drug is given in a hospital or clinic and will not be stored at home. NOTE: This sheet is a summary. It Defrank not cover all possible information. If you have questions about this medicine, talk to your doctor, pharmacist, or health care provider.  2018 Elsevier/Gold Standard  (2016-02-21 12:29:36)

## 2017-11-05 NOTE — Progress Notes (Signed)
Julia [phone]   [phone]  OFFICE PROGRESS NOTE  Julia Nguyen, [place] [address]  DIAGNOSIS: stage IV (T1b, N2, M1c) non-small cell lung cancer, adenocarcinoma presented with right upper lobe lung nodule in addition to extensive mediastinal lymphadenopathy as well as metastatic bone disease including compression fracture of L3 with epidural extension diagnosed in April 2019.  The patient also has suspicious lesion in the cecum concerning for primary colorectal carcinoma.  Biomarker Findings Tumor Mutational Burden - TMB-Intermediate (15 Muts/Mb) Microsatellite status - MS-Stable Genomic Findings For a complete list of the genes assayed, please refer to the Appendix. ERRFI1 truncation exon 4 MYC amplification - equivocal? JYNW2N/F loss AOZ3Y8 splice site 657+8I>O NGE95M splice site 841-3K>G MTAP loss RAD21 amplification - equivocal? TP53 H2102f*48 8 Disease relevant genes with no reportable alterations: EGFR, KRAS, ALK, BRAF, MET, RET, ERBB2, ROS1  PDL1 Expression 80%.   PRIOR THERAPY: Palliative radiotherapy to the metastatic bone disease at L3 with epidural extension under the care of Dr. MLisbeth Renshaw  CURRENT THERAPY: Immunotherapy with Keytruda 200 mg IV every 3 weeks.  First dose [name], 2019.  Status post 1 cycle.  INTERVAL HISTORY: Julia Nguyen 71 y.o. female (72) returns to the clinic today for follow-up visit.  The patient is feeling fine with no specific complaints except for the weakness in her lower extremities and she came on a stretcher.  She denied having any chest pain, shortness of breath, cough or hemoptysis.  She denied having any fever or chills.  She has no nausea, vomiting, diarrhea or constipation.  The patient tolerated the first cycle of her treatment with Keytruda fairly well.  She is here for evaluation before starting cycle #2.   MEDICAL HISTORY: Past Medical History:  Diagnosis  Date  . Adenocarcinoma of right lung, stage 4 ([place] 10/04/2017  . Arthritis    "possibly in some joints" (09/19/2017)  . Lower back pain     ALLERGIES:  has No Known Allergies.  MEDICATIONS:  Current Outpatient Medications  Medication Sig Dispense Refill  . cyclobenzaprine (FLEXERIL) 10 MG tablet Take 10 mg by mouth 3 (three) times daily.    .Marland Kitchendexamethasone (DECADRON) 2 MG tablet Take 2 mg by mouth daily.     . diclofenac (VOLTAREN) 75 MG EC tablet Take 75 mg by mouth 2 (two) times daily.    .Marland KitchenENSURE (ENSURE) Take 240 mLs by mouth 2 (two) times daily.    . Meclizine HCl (BONINE) 25 MG CHEW Chew 1 tablet by mouth every 8 (eight) hours as needed (dizziness).     . morphine (MSIR) 15 MG tablet Take 1 tablet (15 mg total) by mouth every 12 (twelve) hours. 60 tablet 0  . nicotine (NICODERM CQ - DOSED IN MG/24 HOURS) 14 mg/24hr patch Place 1 patch (14 mg total) onto the skin daily. 28 patch 0  . oxyCODONE (ROXICODONE) 15 MG immediate release tablet Take 1 tablet (15 mg total) by mouth every 3 (three) hours as needed for pain. 10 tablet 0  . pantoprazole (PROTONIX) 40 MG tablet Take 1 tablet (40 mg total) by mouth 2 (two) times daily. 60 tablet 0  . polyethylene glycol (MIRALAX / GLYCOLAX) packet Take 17 g by mouth daily as needed for mild constipation. 14 each 0  . senna-docusate (SENOKOT-S) 8.6-50 MG tablet Take 1 tablet by mouth 2 (two) times daily.    . traMADol (ULTRAM) 50 MG tablet Take 1 tablet (50  mg total) by mouth every 6 (six) hours as needed. 120 tablet 0   No current facility-administered medications for this visit.     SURGICAL HISTORY:  Past Surgical History:  Procedure Laterality Date  . NO PAST SURGERIES      REVIEW OF SYSTEMS:  A comprehensive review of systems was negative except for: Musculoskeletal: positive for muscle weakness   PHYSICAL EXAMINATION: General appearance: alert, cooperative, fatigued and no distress Head: Normocephalic, without obvious abnormality,  atraumatic Neck: no adenopathy, no JVD, supple, symmetrical, trachea midline and thyroid not enlarged, symmetric, no tenderness/mass/nodules Lymph nodes: Cervical, supraclavicular, and axillary nodes normal. Resp: clear to auscultation bilaterally Back: symmetric, no curvature. ROM normal. No CVA tenderness. Cardio: regular rate and rhythm, S1, S2 normal, no murmur, click, rub or gallop GI: soft, non-tender; bowel sounds normal; no masses,  no organomegaly Extremities: extremities normal, atraumatic, no cyanosis or edema  ECOG PERFORMANCE STATUS: 2 - Symptomatic, <50% confined to bed  There were no vitals taken for this visit.  LABORATORY DATA: Lab Results  Component Value Date   WBC 9.1 11/05/2017   HGB 12.0 11/05/2017   HCT 35.6 11/05/2017   MCV 86.5 11/05/2017   PLT 208 11/05/2017      Chemistry      Component Value Date/Time   NA 137 10/15/2017 1328   NA 137 10/07/2017   K 4.0 10/15/2017 1328   CL 105 10/15/2017 1328   CO2 25 10/15/2017 1328   BUN 21 10/15/2017 1328   BUN 31 (A) 10/07/2017   CREATININE 0.57 (L) 10/15/2017 1328   GLU 84 10/07/2017      Component Value Date/Time   CALCIUM 8.2 (L) 10/15/2017 1328   ALKPHOS 98 10/15/2017 1328   AST 13 10/15/2017 1328   ALT 59 (H) 10/15/2017 1328   BILITOT 0.3 10/15/2017 1328       RADIOGRAPHIC STUDIES: Ct Head Wo Contrast  Result Date: 10/25/2017 CLINICAL DATA:  Lung cancer staging EXAM: CT HEAD WITHOUT CONTRAST TECHNIQUE: Contiguous axial images were obtained from the base of the skull through the vertex without intravenous contrast. COMPARISON:  None. FINDINGS: Brain: Subtle low-density areas in the central left cerebellum and subcentimeter cystic foci along the right frontal and low left parietal cortex. These are marked on series 2. No significant edema. No infarct, acute hemorrhage, hydrocephalus, or collection. Vascular: Atherosclerotic calcification. Skull: Negative for metastatic focus. Sinuses/Orbits:  Negative Other: These results will be called to the ordering clinician or representative by the Radiologist Assistant, and communication documented in the PACS or zVision Dashboard. IMPRESSION: Small lesions in the left cerebellum, right frontal cortex, and low left parietal cortex are most consistent with metastases in this setting. If brain MRI is not possible, recommend postcontrast head CT. No significant edema or mass effect. Electronically Signed   By: Julia Nguyen M.D.   On: 10/25/2017 14:44   Nm Pet Image Initial (pi) Skull Base To Thigh  Result Date: 10/14/2017 CLINICAL DATA:  Initial treatment strategy for non-small cell lung cancer, based on L3 vertebral lesion biopsy on 09/19/2017 most compatible with metastatic lung adenocarcinoma. EXAM: NUCLEAR MEDICINE PET SKULL BASE TO THIGH TECHNIQUE: 7.2 mCi F-18 FDG was injected intravenously. Full-ring PET imaging was performed from the skull base to thigh after the radiotracer. CT data was obtained and used for attenuation correction and anatomic localization. Fasting blood glucose: 91 mg/dl COMPARISON:  09/17/2017 CT chest, abdomen and pelvis. FINDINGS: Mediastinal blood pool activity: SUV max 2.4 NECK: No hypermetabolic lymph nodes in  the neck. Incidental CT findings: Enlarged heterogeneous thyroid gland without discrete thyroid nodules or thyroid hypermetabolism. CHEST: Spiculated solid peripheral right upper lobe 1.1 cm hypermetabolic nodule with max SUV 4.9 (series 8/image 19). Hypermetabolic right paratracheal adenopathy, with representative 2.6 cm right paratracheal node with max SUV 12.7 (series 4/image 66). Hypermetabolic bilateral hilar adenopathy. Representative 2.0 cm right hilar node with max SUV 20.5 (series 4/image 74). Representative left hilar node with max SUV 9.5, poorly visualized on the noncontrast CT images. Hypermetabolic nonenlarged right prevascular mediastinal nodes. Representative 0.7 cm right prevascular node with max SUV 8.7  (series 4/image 71). Small soft tissue metastasis to the posterior left chest wall musculature between the left scapula and left ribs with max SUV 8.8, not delineated on the noncontrast CT images. Incidental CT findings: At least 8 subcentimeter solid pulmonary nodules scattered in both lungs, largest 6 mm in the left upper lobe (series 8/image 40), below PET resolution. Severe centrilobular emphysema with diffuse bronchial wall thickening. Trace dependent left pleural effusion. Coronary atherosclerosis. Atherosclerotic nonaneurysmal thoracic aorta. Dilated main pulmonary artery (3.7 cm diameter). ABDOMEN/PELVIS: At least 4 hypermetabolic masses scattered in the liver. Representative 1.1 cm superior right liver lobe mass with max SUV 8.7 (series 4/image 97). Representative 1.0 cm inferior right liver lobe mass with max SUV 7.5 (series 4/image 118). Hypermetabolic bilateral adrenal masses measuring 1.4 cm with max SUV 16.3 on the right and 1.9 cm with max SUV 19.0 on the left. Previously described indeterminate hypodense renal cortical lesions in the upper right and posterior lower left kidney on 09/17/2017 CT are not accurately assessed on the PET images, although appear to demonstrate hypermetabolism. Hypermetabolic 3.0 cm cecal mass with max SUV 16.6 (series 4/image 137). Hypermetabolic 0.9 cm right mesenteric node with max SUV 11.0 (series 4/image 143). Hypermetabolic small soft tissue metastases in the left posterior paraspinal musculature and bilateral proximal thigh musculature, with representative left posterior paraspinal muscular soft tissue metastasis with max SUV 8.1 and representative muscular soft tissue metastasis medial to the proximal left femur with max SUV 5.8, not discretely visualized on the noncontrast CT images. Incidental CT findings: Atherosclerotic nonaneurysmal abdominal aorta. SKELETON: Numerous hypermetabolic lytic osseous lesions throughout the axial and proximal appendicular skeleton,  with representative osseous lesions as follows: -Proximal left femoral metaphysis lesion with max SUV 14.2 -medial right iliac bone lesion with max SUV 17.9 -upper right scapular lesion with max SUV 6.1 -lateral left fourth rib lesion with max SUV 6.8 -posterior L4 vertebral lesion with max SUV 14.8 -posterior T2 vertebral lesion with max SUV 10.1 -right C5-6 facet lesion with max SUV 8.4 Incidental CT findings: Redemonstration pathologic L3 vertebral compression fracture. IMPRESSION: 1. Hypermetabolic spiculated solid 1.1 cm peripheral right upper lobe pulmonary nodule, compatible with primary bronchogenic carcinoma. 2. Multiple (at least 8) subcentimeter pulmonary nodules scattered in both lungs, below PET resolution, suspicious for bilateral pulmonary metastases. 3. Hypermetabolic bilateral hilar and mediastinal nodal metastases. 4. Multiple small hypermetabolic liver metastases. 5. Hypermetabolic bilateral adrenal metastases. 6. Suggestion of hypermetabolism within the indeterminate upper right and lower left renal cortical lesions described on 09/17/2017 CT study, suggesting malignant lesions, either renal metastases or primary renal cell carcinomas. 7. Hypermetabolic 3.0 cm cecal mass, most compatible with primary colonic carcinoma. Hypermetabolic right mesenteric adenopathy suggest locoregional nodal metastasis from colonic carcinoma. 8. Widespread hypermetabolic lytic osseous metastases throughout the axial and proximal appendicular skeleton as detailed. Redemonstration of pathologic L3 vertebral compression fracture. 9. Multiple small hypermetabolic soft tissue metastases in the posterior chest  wall, paraspinal and bilateral proximal thigh musculature. 10. Aortic Atherosclerosis (ICD10-I70.0) and Emphysema (ICD10-J43.9). Electronically Signed   By: Julia Nguyen M.D.   On: 10/14/2017 15:02    ASSESSMENT AND PLAN: This is a very pleasant 72 years old white female recently diagnosed with a stage IV  non-small cell lung cancer, adenocarcinoma with no actionable mutations but PDL 1 expression of 80%. The patient is status post palliative radiotherapy to the L3 vertebral body lesion and epidural tumor extension. She is currently on treatment with Keytruda status post 1 cycle.  She tolerated the first cycle of her treatment well. I recommended for the patient to proceed with cycle #2 today as scheduled. She will discontinue her current treatment with Decadron at this point. I will see the patient back for follow-up visit in 3 weeks for evaluation before the next cycle of her treatment. For the malnutrition and hypoalbuminemia, we will refer the patient to the dietitian at the cancer center for further evaluation and management. She was advised to call immediately if she has any concerning symptoms in the interval. The patient voices understanding of current disease status and treatment options and is in agreement with the current care plan.  All questions were answered. The patient knows to call the clinic with any problems, questions or concerns. We can certainly see the patient much sooner if necessary.  I spent 10 minutes counseling the patient face to face. The total time spent in the appointment was 15 minutes.  Disclaimer: This note was dictated with voice recognition software. Similar sounding words can inadvertently be transcribed and [place] not be corrected upon review.

## 2017-11-06 ENCOUNTER — Encounter: Payer: Self-pay | Admitting: Adult Health

## 2017-11-06 ENCOUNTER — Non-Acute Institutional Stay (SKILLED_NURSING_FACILITY): Payer: Medicare Other | Admitting: Adult Health

## 2017-11-06 DIAGNOSIS — C3491 Malignant neoplasm of unspecified part of right bronchus or lung: Secondary | ICD-10-CM

## 2017-11-06 DIAGNOSIS — C7951 Secondary malignant neoplasm of bone: Secondary | ICD-10-CM | POA: Diagnosis not present

## 2017-11-06 DIAGNOSIS — C7952 Secondary malignant neoplasm of bone marrow: Secondary | ICD-10-CM | POA: Diagnosis not present

## 2017-11-06 NOTE — Progress Notes (Signed)
Location:   [facility] Room Number: 107 A Place of Service:  SNF (31)   CODE STATUS: Full Code  No Known Allergies  Chief Complaint  Patient presents with  . Acute Visit    Care Plan Meeting    HPI:  We have come together for her care plan meeting. Her spouse and sister are present as well. We have discussed her overall health status; have reviewed her latest scan results demonstrating mets in numerous areas. She would like to go home; but feels as though she will need 2-3 weeks until after her next imunotherapy dose. She tells me that her back pain and other pain issues are current being managed with her current regimen. Her appetite remains poor; she is now off decadron. She denies any anxiety or insomnia.  If she is able to go home; she will need a semi-electric bed; 3:1 commode; and a high back wheelchair. She will be given scat application for transportation needs upon discharge.   Past Medical History:  Diagnosis Date  . Adenocarcinoma of right lung, stage 4 ([place]) 10/04/2017  . Arthritis    "possibly in some joints" (09/19/2017)  . Lower back pain     Past Surgical History:  Procedure Laterality Date  . NO PAST SURGERIES      Social History   Socioeconomic History  . Marital status: Married    Spouse name: Not on file  . Number of children: 3  . Years of education: Not on file  . Highest education level: Not on file  Occupational History  . Occupation: Retired  Scientific laboratory technician  . Financial resource strain: Not on file  . Food insecurity:    Worry: Not on file    Inability: Not on file  . Transportation needs:    Medical: Not on file    Non-medical: Not on file  Tobacco Use  . Smoking status: Former Smoker    Packs/day: 2.00    Years: 46.00    Pack years: 92.00    Types: Cigarettes    Last attempt to quit: 09/17/2017    Years since quitting: 0.1  . Smokeless tobacco: Never Used  Substance and Sexual Activity  . Alcohol use: No  . Drug  use: No  . Sexual activity: Not Currently  Lifestyle  . Physical activity:    Days per week: Not on file    Minutes per session: Not on file  . Stress: Not on file  Relationships  . Social connections:    Talks on phone: Not on file    Gets together: Not on file    Attends religious service: Not on file    Active member of club or organization: Not on file    Attends meetings of clubs or organizations: Not on file    Relationship status: Not on file  . Intimate partner violence:    Fear of current or ex partner: Not on file    Emotionally abused: Not on file    Physically abused: Not on file    Forced sexual activity: Not on file  Other Topics Concern  . Not on file  Social History Narrative  . Not on file   Family History  Problem Relation Age of Onset  . Heart disease Mother   . Lung cancer Father       VITAL SIGNS BP 140/86   Pulse 87   Temp 97.9 F (36.6 C)   Resp 18   Ht 5\' 5"  (1.651  m)   Wt 150 lb (68 kg)   SpO2 96%   BMI 24.96 kg/m   Outpatient Encounter Medications as of 11/06/2017  Medication Sig  . cyclobenzaprine (FLEXERIL) 10 MG tablet Take 10 mg by mouth 3 (three) times daily.  . diclofenac (VOLTAREN) 75 MG EC tablet Take 75 mg by mouth 2 (two) times daily.  Marland Kitchen ENSURE (ENSURE) Take 240 mLs by mouth 2 (two) times daily.  . Meclizine HCl (BONINE) 25 MG CHEW Chew 1 tablet by mouth every 8 (eight) hours as needed (dizziness).   . morphine (MSIR) 15 MG tablet Take 1 tablet (15 mg total) by mouth every 12 (twelve) hours.  . nicotine (NICODERM CQ - DOSED IN MG/24 HOURS) 14 mg/24hr patch Place 1 patch (14 mg total) onto the skin daily.  Marland Kitchen oxyCODONE (ROXICODONE) 15 MG immediate release tablet Take 1 tablet (15 mg total) by mouth every 3 (three) hours as needed for pain.  . pantoprazole (PROTONIX) 40 MG tablet Take 1 tablet (40 mg total) by mouth 2 (two) times daily.  . polyethylene glycol (MIRALAX / GLYCOLAX) packet Take 17 g by mouth daily as needed for mild  constipation.  . senna-docusate (SENOKOT-S) 8.6-50 MG tablet Take 1 tablet by mouth 2 (two) times daily.  . traMADol (ULTRAM) 50 MG tablet Take 1 tablet (50 mg total) by mouth every 6 (six) hours as needed.  . [DISCONTINUED] dexamethasone (DECADRON) 2 MG tablet Take 2 mg by mouth daily.    No facility-administered encounter medications on file as of 11/06/2017.      SIGNIFICANT DIAGNOSTIC EXAMS  PREVIOUS:   09-15-17: MRI lumbar spine:  1. 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 2. Moderate L3 pathologic fracture. Tumoral extends into epidural space resulting in moderate canal stenosis. 3. Mild canal stenosis L3-4 and L4-5. Neural foraminal narrowing L3-4 through L5-S1: Severe on the LEFT at L5-S1.  09-17-17: ct of chest abdomen and pelvis:  1. Multiple bilateral pulmonary nodules are associated with necrotic and relatively bulky mediastinal and right hilar lymphadenopathy. These imaging features are consistent with metastatic disease in a patient with recently demonstrated osseous metastatic involvement. 2. 10 mm spiculated nodule in the peripheral right upper lobe is the dominant lung nodule on today's study. This could represent a primary malignancy and account for the metastatic disease in the lungs and mediastinum, but please see below. 3. 2.6 cm irregular peripherally enhancing lesion in the cecum, highly concerning for primary colorectal neoplasm. Small para cecal lymph nodes and borderline enlarged ileocolic lymph node are concerning for metastatic disease. 4. 2 cm lesion in the lower pole of the left kidney appears to have irregular peripheral low level enhancement. A similar but smaller 12 mm lesion is identified in the upper pole the right kidney. These could potentially represent complex cysts, apparent peripheral enhancement raises concern for neoplasm. While metastatic disease to the kidneys is not common, the bilateral involvement raises this question. Bilateral  primary renal neoplasm also consideration. Dedicated abdominal MRI without and with contrast would likely prove helpful to further evaluate. 5. Low-density in the medial segment left liver adjacent to the falciform ligament is in a characteristic location for focal fatty deposition but the appearance is more focal and rounded than typically seen. This could also be further assessed at the time of MRI. Small hypervascular lesion in the posterior right liver likely vascular malformation, but could also be further assessed at MRI. 6. Probable tiny bilateral adrenal nodules. 7.  Aortic Atherosclerois (ICD10-170.0) 8.  Emphysema. (  [id number])  09-20-17: ct guided biopsy of paravertebral mass: Technically successful core biopsy of left L3 paravertebral mass.  NO NEW EXAMS     LABS REVIEWED; PREVIOUS:   09-17-17: wbc 14.3; hgb 15.8; hct 46.4; mcv 88.9 ;plt 257; glucose 140; bun 16; creat 0.76; k+ 4.2; na++ 136; ca 9.3; liver normal albumin 3.1 09-23-17: wbc 19.8; hgb 16.9; hct 506; mcv 89.9; plt 305; glucose 164; bun 26; creat 0.70; k+ 4.2; na++ 137; ca 9.4; liver normal albumin 3.0 09-30-17: wbc 15.5; hgb 15.9; hct 47.6; mcv 88.3 plt 225; 10-07-17: wbc 9.5; hgb 14.7; hct 45; plt 148; glucose 84; bun 31; creat 0.4; k+ 4.7; na++ 137; liver normal  10-15-17: wbc 8.5; hgb 13.7; hct 41.4; plt 138; glucose 143; bun 21; creat 0.57; k+ 4.0; na++137 alt 59  albumin 1.9  NO NEW LABS    Review of Systems  Constitutional: Negative for malaise/fatigue.  Respiratory: Negative for cough and shortness of breath.   Cardiovascular: Negative for chest pain, palpitations and leg swelling.  Gastrointestinal: Negative for abdominal pain, constipation and heartburn.  Musculoskeletal: Negative for back pain, joint pain and myalgias.  Skin: Negative.   Neurological: Negative for dizziness.  Psychiatric/Behavioral: The patient is not nervous/anxious.     Physical Exam  Constitutional: She is oriented to person, place,  and time. She appears well-developed and well-nourished. No distress.  Neck: No thyromegaly present.  Cardiovascular: Normal rate, regular rhythm, normal heart sounds and intact distal pulses.  Pulmonary/Chest: Effort normal. No respiratory distress.  Breath sounds diminished bilaterally   Abdominal: Soft. Bowel sounds are normal.  Musculoskeletal: Normal range of motion. She exhibits no edema.  Lymphadenopathy:    She has no cervical adenopathy.  Neurological: She is alert and oriented to person, place, and time.  Skin: Skin is warm and dry. She is not diaphoretic.  Psychiatric: She has a normal mood and affect.    ASSESSMENT/ PLAN:  TODAY:   1. Adenocarcinoma of right lung stage 4 2. Secondary malignant neoplasm of bone and bone marrow   At this time will continue her current therapy goals with her goal to return back home I seriously doubt her ability to return back home She is in denial about her medical prognosis at this time I have spoken with her sister at great length about her overall status;  Will continue her current medication regimen   Time spent with patient and family 47 minutes: discussed her overall medical status; goals of care and therapy; home health needs; expectations upon discharge; dme and medications. Verbalized understanding.   MD is aware of resident's narcotic use and is in agreement with current plan of care. We will attempt to wean resident as apropriate   Ok Edwards NP [facility]  Contact [phone] Monday through Friday 8am- 5pm  After hours call [phone]

## 2017-11-07 ENCOUNTER — Other Ambulatory Visit: Payer: Medicare Other

## 2017-11-12 ENCOUNTER — Telehealth: Payer: Self-pay | Admitting: Medical Oncology

## 2017-11-12 ENCOUNTER — Encounter: Payer: Self-pay | Admitting: Adult Health

## 2017-11-12 ENCOUNTER — Encounter: Payer: Self-pay | Admitting: *Deleted

## 2017-11-12 ENCOUNTER — Non-Acute Institutional Stay (SKILLED_NURSING_FACILITY): Payer: Medicare Other | Admitting: Adult Health

## 2017-11-12 ENCOUNTER — Other Ambulatory Visit: Payer: Self-pay

## 2017-11-12 ENCOUNTER — Ambulatory Visit: Admission: RE | Admit: 2017-11-12 | Payer: Medicare Other | Source: Ambulatory Visit | Admitting: Radiation Oncology

## 2017-11-12 DIAGNOSIS — C3490 Malignant neoplasm of unspecified part of unspecified bronchus or lung: Secondary | ICD-10-CM

## 2017-11-12 DIAGNOSIS — C3491 Malignant neoplasm of unspecified part of right bronchus or lung: Secondary | ICD-10-CM | POA: Diagnosis not present

## 2017-11-12 DIAGNOSIS — C799 Secondary malignant neoplasm of unspecified site: Secondary | ICD-10-CM | POA: Diagnosis not present

## 2017-11-12 MED ORDER — MORPHINE SULFATE 30 MG PO TABS: 30.0000 mg | ORAL_TABLET | Freq: Two times a day (BID) | ORAL | 0 refills | Status: AC

## 2017-11-12 NOTE — Telephone Encounter (Signed)
Son called and asked for information about pt plan of care and status update. I told him [name], NP  has given him the information. " I want to hear it from Dr Julien Nordmann who is treating her , what is her prognosis?" Julia Nguyen gave me permission to talk to her son Pati Gallo.

## 2017-11-12 NOTE — Progress Notes (Signed)
1110 Returned called to [name] at [phone] she wanted to speak with [name], PA to get an update on how she felt Julia Nguyen is doing with her cancer prognosis. The family has a different opinion than what the patient is saying. [name] asked that [name] please give her a call before the 1530 appointment today for Julia Nguyen.  Message left for [name] to call [name] at [phone].

## 2017-11-12 NOTE — Telephone Encounter (Signed)
Pt's NP at facility she's recovering in, [name] called to let me know that she's concerned about the patient's understanding of her disease status and that she's still having quite a bit of pain. She has a palliative care consult tomorrow. I'm seeing her today for 1 month follow up after her palliative radiotherapy to her spine. I will also reach out to Dr. Julien Nordmann since her CT brain revealed several areas of disease on Ct imaging.

## 2017-11-12 NOTE — Telephone Encounter (Signed)
Rx faxed to [facility] (P) [phone], (F) [phone] 

## 2017-11-12 NOTE — Progress Notes (Signed)
Location:   [facility] Room Number: 107 A Place of Service:  SNF (31)   CODE STATUS: Full Code  No Known Allergies  Chief Complaint  Patient presents with  . Acute Visit    Change in status    HPI:  She has had a change in her status. She is having a great deal of back and feet pain which is not being managed with her current regimen. Therapy tells me that she is having difficulty participating in her therapy. Due to her increased pain; she is unable to fully participate in the hpi or ros. I have had a prolonged discussion with her spouse and son Gwyndolyn Saxon regarding her poor prognosis. I have spoken with her PA at the cancer center regarding her status. She is in agreement that she would benefit from a palliative care consult. Her husband would like to start hospice services. Her son is making arrangements to come back to be with his mother. She has lost weight over the past week. She is not eating; will drink fluids. She is more disoriented; her family feels as though she has had a cognitive decline over the past week.     Past Medical History:  Diagnosis Date  . Adenocarcinoma of right lung, stage 4 ([place]) 10/04/2017  . Arthritis    "possibly in some joints" (09/19/2017)  . Lower back pain     Past Surgical History:  Procedure Laterality Date  . NO PAST SURGERIES      Social History   Socioeconomic History  . Marital status: Married    Spouse name: Not on file  . Number of children: 3  . Years of education: Not on file  . Highest education level: Not on file  Occupational History  . Occupation: Retired  Scientific laboratory technician  . Financial resource strain: Not on file  . Food insecurity:    Worry: Not on file    Inability: Not on file  . Transportation needs:    Medical: Not on file    Non-medical: Not on file  Tobacco Use  . Smoking status: Former Smoker    Packs/day: 2.00    Years: 46.00    Pack years: 92.00    Types: Cigarettes    Last attempt to quit:  09/17/2017    Years since quitting: 0.1  . Smokeless tobacco: Never Used  Substance and Sexual Activity  . Alcohol use: No  . Drug use: No  . Sexual activity: Not Currently  Lifestyle  . Physical activity:    Days per week: Not on file    Minutes per session: Not on file  . Stress: Not on file  Relationships  . Social connections:    Talks on phone: Not on file    Gets together: Not on file    Attends religious service: Not on file    Active member of club or organization: Not on file    Attends meetings of clubs or organizations: Not on file    Relationship status: Not on file  . Intimate partner violence:    Fear of current or ex partner: Not on file    Emotionally abused: Not on file    Physically abused: Not on file    Forced sexual activity: Not on file  Other Topics Concern  . Not on file  Social History Narrative  . Not on file   Family History  Problem Relation Age of Onset  . Heart disease Mother   . Lung  cancer Father       VITAL SIGNS BP 136/82   Pulse 82   Temp 97.6 F (36.4 C)   Resp 20   Ht 5\' 5"  (1.651 m)   Wt 129 lb 12.8 oz (58.9 kg)   SpO2 97%   BMI 21.60 kg/m   Outpatient Encounter Medications as of 11/12/2017  Medication Sig  . cyclobenzaprine (FLEXERIL) 10 MG tablet Take 10 mg by mouth 3 (three) times daily.  . diclofenac (VOLTAREN) 75 MG EC tablet Take 75 mg by mouth 2 (two) times daily.  Marland Kitchen ENSURE (ENSURE) Take 237 mLs by mouth 2 (two) times daily.   . Meclizine HCl (BONINE) 25 MG CHEW Chew 1 tablet by mouth every 8 (eight) hours as needed (dizziness).   . morphine (MSIR) 15 MG tablet Take 1 tablet (15 mg total) by mouth every 12 (twelve) hours.  . Multiple Vitamin (MULTIVITAMIN) tablet Take 1 tablet by mouth daily.  . nicotine (NICODERM CQ - DOSED IN MG/24 HOURS) 14 mg/24hr patch Place 1 patch (14 mg total) onto the skin daily.  . Nutritional Supplements (PROMOD) LIQD Give 30 ml by mouth two times daily  . oxyCODONE (ROXICODONE) 15 MG  immediate release tablet Take 1 tablet (15 mg total) by mouth every 3 (three) hours as needed for pain.  . pantoprazole (PROTONIX) 40 MG tablet Take 1 tablet (40 mg total) by mouth 2 (two) times daily.  . polyethylene glycol (MIRALAX / GLYCOLAX) packet Take 17 g by mouth daily as needed for mild constipation.  . senna-docusate (SENOKOT-S) 8.6-50 MG tablet Take 1 tablet by mouth 2 (two) times daily.  . traMADol (ULTRAM) 50 MG tablet Take 1 tablet (50 mg total) by mouth every 6 (six) hours as needed.   No facility-administered encounter medications on file as of 11/12/2017.      SIGNIFICANT DIAGNOSTIC EXAMS  PREVIOUS:   09-15-17: MRI lumbar spine:  1. 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 2. Moderate L3 pathologic fracture. Tumoral extends into epidural space resulting in moderate canal stenosis. 3. Mild canal stenosis L3-4 and L4-5. Neural foraminal narrowing L3-4 through L5-S1: Severe on the LEFT at L5-S1.  09-17-17: ct of chest abdomen and pelvis:  1. Multiple bilateral pulmonary nodules are associated with necrotic and relatively bulky mediastinal and right hilar lymphadenopathy. These imaging features are consistent with metastatic disease in a patient with recently demonstrated osseous metastatic involvement. 2. 10 mm spiculated nodule in the peripheral right upper lobe is the dominant lung nodule on today's study. This could represent a primary malignancy and account for the metastatic disease in the lungs and mediastinum, but please see below. 3. 2.6 cm irregular peripherally enhancing lesion in the cecum, highly concerning for primary colorectal neoplasm. Small para cecal lymph nodes and borderline enlarged ileocolic lymph node are concerning for metastatic disease. 4. 2 cm lesion in the lower pole of the left kidney appears to have irregular peripheral low level enhancement. A similar but smaller 12 mm lesion is identified in the upper pole the right kidney. These could  potentially represent complex cysts, apparent peripheral enhancement raises concern for neoplasm. While metastatic disease to the kidneys is not common, the bilateral involvement raises this question. Bilateral primary renal neoplasm also consideration. Dedicated abdominal MRI without and with contrast would likely prove helpful to further evaluate. 5. Low-density in the medial segment left liver adjacent to the falciform ligament is in a characteristic location for focal fatty deposition but the appearance is more focal and rounded  than typically seen. This could also be further assessed at the time of MRI. Small hypervascular lesion in the posterior right liver likely vascular malformation, but could also be further assessed at MRI. 6. Probable tiny bilateral adrenal nodules. 7.  Aortic Atherosclerois (ICD10-170.0) 8.  Emphysema. (ICD10-J43.9)  09-20-17: ct guided biopsy of paravertebral mass: Technically successful core biopsy of left L3 paravertebral mass.  NO NEW EXAMS     LABS REVIEWED; PREVIOUS:   09-17-17: wbc 14.3; hgb 15.8; hct 46.4; mcv 88.9 ;plt 257; glucose 140; bun 16; creat 0.76; k+ 4.2; na++ 136; ca 9.3; liver normal albumin 3.1 09-23-17: wbc 19.8; hgb 16.9; hct 506; mcv 89.9; plt 305; glucose 164; bun 26; creat 0.70; k+ 4.2; na++ 137; ca 9.4; liver normal albumin 3.0 09-30-17: wbc 15.5; hgb 15.9; hct 47.6; mcv 88.3 plt 225; 10-07-17: wbc 9.5; hgb 14.7; hct 45; plt 148; glucose 84; bun 31; creat 0.4; k+ 4.7; na++ 137; liver normal  10-15-17: wbc 8.5; hgb 13.7; hct 41.4; plt 138; glucose 143; bun 21; creat 0.57; k+ 4.0; na++137 alt 59  albumin 1.9  NO NEW LABS   Review of Systems  Unable to perform ROS: Severity of pain      Physical Exam  Constitutional: No distress.  Frail   Neck: No thyromegaly present.  Cardiovascular: Normal rate, regular rhythm and intact distal pulses.  Murmur heard. 1/6  Pulmonary/Chest: Effort normal. No respiratory distress.  Breath sounds  diminished bilaterally   Abdominal: Soft. Bowel sounds are normal. She exhibits no distension. There is no tenderness.  Musculoskeletal: She exhibits no edema.  Is able to move all extremities   Lymphadenopathy:    She has no cervical adenopathy.  Neurological: She is alert.  Skin: Skin is warm and dry. She is not diaphoretic.  Psychiatric: She has a normal mood and affect.    ASSESSMENT/ PLAN:  TODAY  1. Adenocarcinoma of right lung stage 4 2. 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 Verbalized understanding.     MD is aware of resident's narcotic use and is in agreement with current plan of care. We will attempt to wean resident as apropriate   Ok Edwards NP [facility]  Contact [phone] Monday through Friday 8am- 5pm  After hours call [phone]

## 2017-11-13 ENCOUNTER — Inpatient Hospital Stay (HOSPITAL_COMMUNITY): Payer: Medicare Other

## 2017-11-13 ENCOUNTER — Other Ambulatory Visit: Payer: Self-pay

## 2017-11-13 ENCOUNTER — Other Ambulatory Visit (HOSPITAL_COMMUNITY): Payer: Medicare Other

## 2017-11-13 ENCOUNTER — Inpatient Hospital Stay (HOSPITAL_COMMUNITY)
Admission: EM | Admit: 2017-11-13 | Discharge: 2017-11-25 | DRG: 871 | Disposition: E | Payer: Medicare Other | Attending: Pulmonary Disease | Admitting: Pulmonary Disease

## 2017-11-13 ENCOUNTER — Emergency Department (HOSPITAL_COMMUNITY): Payer: Medicare Other

## 2017-11-13 ENCOUNTER — Other Ambulatory Visit: Payer: Medicare Other

## 2017-11-13 ENCOUNTER — Encounter (HOSPITAL_COMMUNITY): Payer: Self-pay | Admitting: *Deleted

## 2017-11-13 ENCOUNTER — Inpatient Hospital Stay: Admission: RE | Admit: 2017-11-13 | Payer: Medicare Other | Source: Ambulatory Visit

## 2017-11-13 DIAGNOSIS — N179 Acute kidney failure, unspecified: Secondary | ICD-10-CM | POA: Diagnosis present

## 2017-11-13 DIAGNOSIS — C787 Secondary malignant neoplasm of liver and intrahepatic bile duct: Secondary | ICD-10-CM | POA: Diagnosis present

## 2017-11-13 DIAGNOSIS — C7952 Secondary malignant neoplasm of bone marrow: Secondary | ICD-10-CM | POA: Diagnosis present

## 2017-11-13 DIAGNOSIS — Z452 Encounter for adjustment and management of vascular access device: Secondary | ICD-10-CM

## 2017-11-13 DIAGNOSIS — Z515 Encounter for palliative care: Secondary | ICD-10-CM | POA: Diagnosis present

## 2017-11-13 DIAGNOSIS — Z79899 Other long term (current) drug therapy: Secondary | ICD-10-CM

## 2017-11-13 DIAGNOSIS — C7970 Secondary malignant neoplasm of unspecified adrenal gland: Secondary | ICD-10-CM | POA: Diagnosis present

## 2017-11-13 DIAGNOSIS — Z66 Do not resuscitate: Secondary | ICD-10-CM | POA: Diagnosis not present

## 2017-11-13 DIAGNOSIS — R6521 Severe sepsis with septic shock: Secondary | ICD-10-CM | POA: Diagnosis present

## 2017-11-13 DIAGNOSIS — J150 Pneumonia due to Klebsiella pneumoniae: Secondary | ICD-10-CM | POA: Diagnosis present

## 2017-11-13 DIAGNOSIS — Z923 Personal history of irradiation: Secondary | ICD-10-CM

## 2017-11-13 DIAGNOSIS — Z9221 Personal history of antineoplastic chemotherapy: Secondary | ICD-10-CM

## 2017-11-13 DIAGNOSIS — Y95 Nosocomial condition: Secondary | ICD-10-CM | POA: Diagnosis present

## 2017-11-13 DIAGNOSIS — C7931 Secondary malignant neoplasm of brain: Secondary | ICD-10-CM | POA: Diagnosis present

## 2017-11-13 DIAGNOSIS — N1330 Unspecified hydronephrosis: Secondary | ICD-10-CM | POA: Diagnosis present

## 2017-11-13 DIAGNOSIS — A419 Sepsis, unspecified organism: Secondary | ICD-10-CM | POA: Diagnosis present

## 2017-11-13 DIAGNOSIS — Z87891 Personal history of nicotine dependence: Secondary | ICD-10-CM

## 2017-11-13 DIAGNOSIS — J189 Pneumonia, unspecified organism: Secondary | ICD-10-CM

## 2017-11-13 DIAGNOSIS — C3491 Malignant neoplasm of unspecified part of right bronchus or lung: Secondary | ICD-10-CM | POA: Diagnosis present

## 2017-11-13 DIAGNOSIS — J9601 Acute respiratory failure with hypoxia: Secondary | ICD-10-CM | POA: Diagnosis present

## 2017-11-13 DIAGNOSIS — C7951 Secondary malignant neoplasm of bone: Secondary | ICD-10-CM | POA: Diagnosis present

## 2017-11-13 DIAGNOSIS — J9600 Acute respiratory failure, unspecified whether with hypoxia or hypercapnia: Secondary | ICD-10-CM | POA: Diagnosis present

## 2017-11-13 DIAGNOSIS — C7900 Secondary malignant neoplasm of unspecified kidney and renal pelvis: Secondary | ICD-10-CM | POA: Diagnosis present

## 2017-11-13 DIAGNOSIS — J13 Pneumonia due to Streptococcus pneumoniae: Secondary | ICD-10-CM | POA: Diagnosis present

## 2017-11-13 DIAGNOSIS — R579 Shock, unspecified: Secondary | ICD-10-CM

## 2017-11-13 LAB — CORTISOL: Cortisol, Plasma: >100 ug/dL

## 2017-11-13 LAB — POCT I-STAT 3, ART BLOOD GAS (G3+)
ACID-BASE DEFICIT: 14 mmol/L — AB (ref 0.0–2.0)
Bicarbonate: 14.4 mmol/L — ABNORMAL LOW (ref 20.0–28.0)
O2 Saturation: 97 %
PH ART: 7.174 — AB (ref 7.350–7.450)
PO2 ART: 111 mmHg — AB (ref 83.0–108.0)
Patient temperature: 97
TCO2: 16 mmol/L — ABNORMAL LOW (ref 22–32)
pCO2 arterial: 38.5 mmHg (ref 32.0–48.0)

## 2017-11-13 LAB — BRAIN NATRIURETIC PEPTIDE: B Natriuretic Peptide: 169.6 pg/mL — ABNORMAL HIGH (ref 0.0–100.0)

## 2017-11-13 LAB — I-STAT ARTERIAL BLOOD GAS, ED
Acid-base deficit: 12 mmol/L — ABNORMAL HIGH (ref 0.0–2.0)
Bicarbonate: 16.4 mmol/L — ABNORMAL LOW (ref 20.0–28.0)
O2 Saturation: 91 %
PCO2 ART: 45.6 mmHg (ref 32.0–48.0)
PO2 ART: 78 mmHg — AB (ref 83.0–108.0)
Patient temperature: 98.6
TCO2: 18 mmol/L — AB (ref 22–32)
pH, Arterial: 7.163 — CL (ref 7.350–7.450)

## 2017-11-13 LAB — I-STAT CG4 LACTIC ACID, ED
Lactic Acid, Venous: 1.78 mmol/L (ref 0.5–1.9)
Lactic Acid, Venous: 1.89 mmol/L (ref 0.5–1.9)

## 2017-11-13 LAB — COMPREHENSIVE METABOLIC PANEL
ALK PHOS: 141 U/L — AB (ref 38–126)
ALT: 39 U/L (ref 14–54)
AST: 26 U/L (ref 15–41)
Albumin: 1.7 g/dL — ABNORMAL LOW (ref 3.5–5.0)
Anion gap: 15 (ref 5–15)
BILIRUBIN TOTAL: 1.6 mg/dL — AB (ref 0.3–1.2)
BUN: 68 mg/dL — AB (ref 6–20)
CALCIUM: 7.4 mg/dL — AB (ref 8.9–10.3)
CO2: 18 mmol/L — ABNORMAL LOW (ref 22–32)
CREATININE: 3.44 mg/dL — AB (ref 0.44–1.00)
Chloride: 100 mmol/L — ABNORMAL LOW (ref 101–111)
GFR calc Af Amer: 14 mL/min — ABNORMAL LOW (ref >60)
GFR, EST NON AFRICAN AMERICAN: 12 mL/min — AB (ref >60)
Glucose, Bld: 83 mg/dL (ref 65–99)
POTASSIUM: 5.4 mmol/L — AB (ref 3.5–5.1)
Sodium: 133 mmol/L — ABNORMAL LOW (ref 135–145)
TOTAL PROTEIN: 3.9 g/dL — AB (ref 6.5–8.1)

## 2017-11-13 LAB — CBC
HEMATOCRIT: 33.7 % — AB (ref 36.0–46.0)
Hemoglobin: 10.7 g/dL — ABNORMAL LOW (ref 12.0–15.0)
MCH: 28.4 pg (ref 26.0–34.0)
MCHC: 31.8 g/dL (ref 30.0–36.0)
MCV: 89.4 fL (ref 78.0–100.0)
PLATELETS: 212 10*3/uL (ref 150–400)
RBC: 3.77 MIL/uL — AB (ref 3.87–5.11)
RDW: 16.2 % — ABNORMAL HIGH (ref 11.5–15.5)
WBC: 9.4 10*3/uL (ref 4.0–10.5)

## 2017-11-13 LAB — URINALYSIS, ROUTINE W REFLEX MICROSCOPIC
BACTERIA UA: NONE SEEN
GLUCOSE, UA: NEGATIVE mg/dL
KETONES UR: NEGATIVE mg/dL
NITRITE: NEGATIVE
PH: 5 (ref 5.0–8.0)
PROTEIN: 100 mg/dL — AB
Specific Gravity, Urine: 1.017 (ref 1.005–1.030)

## 2017-11-13 LAB — CBC WITH DIFFERENTIAL/PLATELET
BASOS PCT: 1 %
Basophils Absolute: 0.1 10*3/uL (ref 0.0–0.1)
EOS ABS: 0.1 10*3/uL (ref 0.0–0.7)
Eosinophils Relative: 1 %
HCT: 35.7 % — ABNORMAL LOW (ref 36.0–46.0)
Hemoglobin: 11.2 g/dL — ABNORMAL LOW (ref 12.0–15.0)
LYMPHS ABS: 0.7 10*3/uL (ref 0.7–4.0)
LYMPHS PCT: 6 %
MCH: 28 pg (ref 26.0–34.0)
MCHC: 31.4 g/dL (ref 30.0–36.0)
MCV: 89.3 fL (ref 78.0–100.0)
MONO ABS: 0.9 10*3/uL (ref 0.1–1.0)
Monocytes Relative: 8 %
NEUTROS ABS: 9.9 10*3/uL — AB (ref 1.7–7.7)
Neutrophils Relative %: 84 %
PLATELETS: 251 10*3/uL (ref 150–400)
RBC: 4 MIL/uL (ref 3.87–5.11)
RDW: 16.1 % — AB (ref 11.5–15.5)
WBC: 11.7 10*3/uL — ABNORMAL HIGH (ref 4.0–10.5)

## 2017-11-13 LAB — PROCALCITONIN: Procalcitonin: 2.06 ng/mL

## 2017-11-13 LAB — LACTIC ACID, PLASMA
LACTIC ACID, VENOUS: 1.6 mmol/L (ref 0.5–1.9)
LACTIC ACID, VENOUS: 3.1 mmol/L — AB (ref 0.5–1.9)
Lactic Acid, Venous: 2.7 mmol/L (ref 0.5–1.9)

## 2017-11-13 LAB — CREATININE, SERUM
Creatinine, Ser: 2.85 mg/dL — ABNORMAL HIGH (ref 0.44–1.00)
GFR calc Af Amer: 18 mL/min — ABNORMAL LOW (ref >60)
GFR calc non Af Amer: 15 mL/min — ABNORMAL LOW (ref >60)

## 2017-11-13 LAB — MRSA PCR SCREENING: MRSA BY PCR: NEGATIVE

## 2017-11-13 LAB — STREP PNEUMONIAE URINARY ANTIGEN: STREP PNEUMO URINARY ANTIGEN: POSITIVE — AB

## 2017-11-13 LAB — TROPONIN I
Troponin I: 0.07 ng/mL (ref <0.03)
Troponin I: 0.24 ng/mL (ref <0.03)

## 2017-11-13 LAB — PROTIME-INR
INR: 1.27
Prothrombin Time: 15.8 seconds — ABNORMAL HIGH (ref 11.4–15.2)

## 2017-11-13 MED ORDER — SODIUM CHLORIDE 0.9 % IV SOLN
INTRAVENOUS | Status: DC
  Administered 2017-11-13: 11:00:00 via INTRAVENOUS

## 2017-11-13 MED ORDER — CHLORHEXIDINE GLUCONATE 0.12% ORAL RINSE (MEDLINE KIT)
15.0000 mL | Freq: Two times a day (BID) | OROMUCOSAL | Status: DC
  Administered 2017-11-13 – 2017-11-14 (×3): 15 mL via OROMUCOSAL

## 2017-11-13 MED ORDER — NOREPINEPHRINE 16 MG/250ML-% IV SOLN
0.0000 ug/min | INTRAVENOUS | Status: DC
  Administered 2017-11-13: 25 ug/min via INTRAVENOUS
  Administered 2017-11-14: 26 ug/min via INTRAVENOUS
  Filled 2017-11-13 (×2): qty 250

## 2017-11-13 MED ORDER — MIDAZOLAM HCL 2 MG/2ML IJ SOLN
1.0000 mg | INTRAMUSCULAR | Status: AC | PRN
  Administered 2017-11-14 (×3): 1 mg via INTRAVENOUS

## 2017-11-13 MED ORDER — VASOPRESSIN 20 UNIT/ML IV SOLN
0.0300 [IU]/min | INTRAVENOUS | Status: DC
  Administered 2017-11-13: 0.03 [IU]/min via INTRAVENOUS
  Filled 2017-11-13 (×2): qty 2

## 2017-11-13 MED ORDER — ORAL CARE MOUTH RINSE
15.0000 mL | OROMUCOSAL | Status: DC
  Administered 2017-11-13 – 2017-11-14 (×9): 15 mL via OROMUCOSAL

## 2017-11-13 MED ORDER — PIPERACILLIN-TAZOBACTAM 3.375 G IVPB 30 MIN
3.3750 g | Freq: Once | INTRAVENOUS | Status: AC
  Administered 2017-11-13: 3.375 g via INTRAVENOUS
  Filled 2017-11-13: qty 50

## 2017-11-13 MED ORDER — PIPERACILLIN-TAZOBACTAM IN DEX 2-0.25 GM/50ML IV SOLN
2.2500 g | Freq: Three times a day (TID) | INTRAVENOUS | Status: DC
  Administered 2017-11-13 – 2017-11-14 (×3): 2.25 g via INTRAVENOUS
  Filled 2017-11-13 (×4): qty 50

## 2017-11-13 MED ORDER — NOREPINEPHRINE 4 MG/250ML-% IV SOLN
0.0000 ug/min | Freq: Once | INTRAVENOUS | Status: AC
  Administered 2017-11-13: 5 ug/min via INTRAVENOUS
  Filled 2017-11-13: qty 250

## 2017-11-13 MED ORDER — MIDAZOLAM HCL 2 MG/2ML IJ SOLN
2.0000 mg | Freq: Once | INTRAMUSCULAR | Status: AC
  Administered 2017-11-13: 2 mg via INTRAVENOUS

## 2017-11-13 MED ORDER — HEPARIN SODIUM (PORCINE) 5000 UNIT/ML IJ SOLN
5000.0000 [IU] | Freq: Three times a day (TID) | INTRAMUSCULAR | Status: DC
  Administered 2017-11-13 – 2017-11-14 (×3): 5000 [IU] via SUBCUTANEOUS
  Filled 2017-11-13 (×3): qty 1

## 2017-11-13 MED ORDER — SODIUM CHLORIDE 0.9 % IV BOLUS (SEPSIS)
1000.0000 mL | Freq: Once | INTRAVENOUS | Status: AC
  Administered 2017-11-13: 1000 mL via INTRAVENOUS

## 2017-11-13 MED ORDER — SODIUM BICARBONATE 8.4 % IV SOLN
INTRAVENOUS | Status: AC
  Administered 2017-11-13: 100 meq
  Filled 2017-11-13: qty 100

## 2017-11-13 MED ORDER — SODIUM CHLORIDE 0.9 % IV SOLN
Freq: Once | INTRAVENOUS | Status: AC
  Administered 2017-11-13: 13:00:00 via INTRAVENOUS

## 2017-11-13 MED ORDER — VANCOMYCIN HCL IN DEXTROSE 1-5 GM/200ML-% IV SOLN
1000.0000 mg | Freq: Once | INTRAVENOUS | Status: AC
  Administered 2017-11-13: 1000 mg via INTRAVENOUS
  Filled 2017-11-13: qty 200

## 2017-11-13 MED ORDER — LACTATED RINGERS IV BOLUS
1000.0000 mL | Freq: Once | INTRAVENOUS | Status: AC
  Administered 2017-11-13: 1000 mL via INTRAVENOUS

## 2017-11-13 MED ORDER — FENTANYL CITRATE (PF) 100 MCG/2ML IJ SOLN
50.0000 ug | INTRAMUSCULAR | Status: DC | PRN
  Filled 2017-11-13: qty 2

## 2017-11-13 MED ORDER — FAMOTIDINE IN NACL 20-0.9 MG/50ML-% IV SOLN
20.0000 mg | Freq: Every day | INTRAVENOUS | Status: DC
  Administered 2017-11-13 – 2017-11-14 (×2): 20 mg via INTRAVENOUS
  Filled 2017-11-13 (×2): qty 50

## 2017-11-13 MED ORDER — HYDROCORTISONE NA SUCCINATE PF 100 MG IJ SOLR
100.0000 mg | Freq: Three times a day (TID) | INTRAMUSCULAR | Status: DC
  Administered 2017-11-13 – 2017-11-14 (×4): 100 mg via INTRAVENOUS
  Filled 2017-11-13 (×4): qty 2

## 2017-11-13 MED ORDER — MIDAZOLAM HCL 2 MG/2ML IJ SOLN
INTRAMUSCULAR | Status: AC
  Filled 2017-11-13: qty 6

## 2017-11-13 MED ORDER — VANCOMYCIN HCL IN DEXTROSE 750-5 MG/150ML-% IV SOLN: 750.0000 mg | INTRAVENOUS | Status: DC

## 2017-11-13 MED ORDER — NOREPINEPHRINE 4 MG/250ML-% IV SOLN
0.0000 ug/min | INTRAVENOUS | Status: DC
  Administered 2017-11-13: 25 ug/min via INTRAVENOUS
  Administered 2017-11-13: 24 ug/min via INTRAVENOUS
  Administered 2017-11-13: 31 ug/min via INTRAVENOUS
  Filled 2017-11-13 (×4): qty 250

## 2017-11-13 MED ORDER — LORAZEPAM 2 MG/ML IJ SOLN
0.5000 mg | Freq: Once | INTRAMUSCULAR | Status: AC
  Administered 2017-11-13: 0.5 mg via INTRAVENOUS
  Filled 2017-11-13: qty 1

## 2017-11-13 MED ORDER — FENTANYL CITRATE (PF) 100 MCG/2ML IJ SOLN
INTRAMUSCULAR | Status: AC
  Filled 2017-11-13: qty 4

## 2017-11-13 MED ORDER — FENTANYL CITRATE (PF) 100 MCG/2ML IJ SOLN
50.0000 ug | INTRAMUSCULAR | Status: DC | PRN
  Administered 2017-11-13: 50 ug via INTRAVENOUS

## 2017-11-13 MED ORDER — ETOMIDATE 2 MG/ML IV SOLN
INTRAVENOUS | Status: AC
  Administered 2017-11-13: 20 mg
  Filled 2017-11-13: qty 10

## 2017-11-13 MED ORDER — MIDAZOLAM HCL 2 MG/2ML IJ SOLN
1.0000 mg | INTRAMUSCULAR | Status: DC | PRN
  Administered 2017-11-14: 1 mg via INTRAVENOUS
  Filled 2017-11-13: qty 2

## 2017-11-13 MED ORDER — FENTANYL CITRATE (PF) 100 MCG/2ML IJ SOLN
50.0000 ug | Freq: Once | INTRAMUSCULAR | Status: AC
  Administered 2017-11-13: 50 ug via INTRAVENOUS

## 2017-11-13 MED ORDER — FAMOTIDINE IN NACL 20-0.9 MG/50ML-% IV SOLN: 20.0000 mg | Freq: Two times a day (BID) | INTRAVENOUS | Status: DC

## 2017-11-13 MED ORDER — FENTANYL 2500MCG IN NS 250ML (10MCG/ML) PREMIX INFUSION
25.0000 ug/h | INTRAVENOUS | Status: DC
  Administered 2017-11-13: 50 ug/h via INTRAVENOUS
  Filled 2017-11-13: qty 250

## 2017-11-13 MED ORDER — MIDAZOLAM HCL 2 MG/2ML IJ SOLN
1.0000 mg | INTRAMUSCULAR | Status: DC | PRN
  Filled 2017-11-13 (×3): qty 2

## 2017-11-13 MED ORDER — ETOMIDATE 2 MG/ML IV SOLN
INTRAVENOUS | Status: AC | PRN
  Administered 2017-11-13: 18 mg via INTRAVENOUS

## 2017-11-13 MED ORDER — PANTOPRAZOLE SODIUM 40 MG PO PACK: 40.0000 mg | PACK | Freq: Every day | ORAL | Status: DC

## 2017-11-13 MED ORDER — ROCURONIUM BROMIDE 50 MG/5ML IV SOLN
INTRAVENOUS | Status: AC | PRN
  Administered 2017-11-13: 60 mg via INTRAVENOUS

## 2017-11-13 MED ORDER — MIDAZOLAM HCL 2 MG/2ML IJ SOLN
1.0000 mg | INTRAMUSCULAR | Status: DC | PRN
  Administered 2017-11-13 (×2): 1 mg via INTRAVENOUS
  Filled 2017-11-13 (×2): qty 2

## 2017-11-13 MED ORDER — FENTANYL BOLUS VIA INFUSION
25.0000 ug | INTRAVENOUS | Status: DC | PRN
  Administered 2017-11-14: 25 ug via INTRAVENOUS
  Filled 2017-11-13: qty 25

## 2017-11-13 NOTE — Progress Notes (Signed)
RT notified Dr. Vaughan Browner of critical ABG values.

## 2017-11-13 NOTE — ED Triage Notes (Signed)
Pt arrives via EMS from [facility] facility. Called out for AMS. Pt husband says that the patient slept all day and she woke up around 2000 with  AMS. Initial pressure 68/46, 450NS given, 86/46 last BP. Normally a/o x4, now only alert to self. Lungs clear, RA 92%, cbg 98, hr 112.

## 2017-11-13 NOTE — Telephone Encounter (Signed)
It would be nice if he can attend her next visit if he lives locally.  He can ask all the questions and knows about her diagnosis in the presence of his mom.

## 2017-11-13 NOTE — Procedures (Signed)
Arterial Catheter Insertion Procedure Note Julia Nguyen [id number] 08/17/1945  Procedure: Insertion of Arterial Catheter  Indications: Blood pressure monitoring and Frequent blood sampling  Procedure Details Consent: Unable to obtain consent because of emergent medical necessity. Time Out: Verified patient identification, verified procedure, site/side was marked, verified correct patient position, special equipment/implants available, medications/allergies/relevent history reviewed, required imaging and test results available.  Performed  Maximum sterile technique was used including antiseptics, cap, gloves, gown, hand hygiene, mask and sheet. Skin prep: Chlorhexidine; local anesthetic administered 20 gauge catheter was inserted into right radial artery using the Seldinger technique. ULTRASOUND GUIDANCE USED: NO Evaluation Blood flow good; BP tracing good. Complications: No apparent complications.   Julia Nguyen 11/06/2017

## 2017-11-13 NOTE — ED Provider Notes (Signed)
Pt began to decline, mental status worsening, less responsive to verbal stimuli and more tachypneic Decision to intubate patient  INTUBATION Performed by: Sharyon Cable Required items: required , devices, and special equipment available Patient identity confirmed: provided demographic data and hospital-assigned identification number Time out: emergent procedure Indications: respiratory failure Intubation method: Glidescope Laryngoscopy  Preoxygenation: BVM Sedatives: Etomidate Paralytic: rocuronium Tube Size: 7.5 cuffed Post-procedure assessment: chest rise and ETCO2 monitor Breath sounds: equal and absent over the epigastrium Tube secured with: ETT holder Chest x-ray pending at this time  As soon as I intubated patient, I gave report to dr Vaughan Browner with critical care       Ripley Fraise, MD 10/31/2017 [phone]

## 2017-11-13 NOTE — ED Notes (Signed)
PCXR done and patient prepared to transport to ICU

## 2017-11-13 NOTE — Significant Event (Signed)
PCCM Interval Note   Spoke with husband at bedside concerning patient's condition and updated him concerning her instability and multisystem organ failure.  He last saw her in ER, prior to her respiratory decompensation, needing intubation.  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  He states that they had prior discussions regarding life support and that she would not want life support.  She has three sons, two of which are driving down from [place], [place].  He wishes to continue current therapies for now.  However, if patient was to decline further we would not proceed with CPR or cardioversion, as this would not add any benefit.  We will continue therapies, including mechanical ventilation, vasopressors, IVFs, and antibiotics and regroup when the remainder of the family arrives.  Dr. Vaughan Browner present for conversation.  Code status changed to reflect conversation.  Kennieth Rad, AGACNP-BC [facility] Pgr: [phone] or if no answer [phone] 11/14/2017, 2:55 PM

## 2017-11-13 NOTE — ED Notes (Signed)
Purewick noted to be covered in stool. Purewick was removed, pt cleaned and placed on pad.

## 2017-11-13 NOTE — ED Notes (Signed)
Patient only has a very small amount of dark amber urine output at this time, not enough for urinalysis and culture specimens, will continue to monitor.

## 2017-11-13 NOTE — Progress Notes (Signed)
Wasted 149mcg of fentanyl and 3 mg versed in the sharps container. Witnessed by Riccardo Dubin., RN.

## 2017-11-13 NOTE — Procedures (Signed)
Central Venous Catheter Insertion Procedure Note Clay Menser Bonenberger [id number] 11-08-1945  Procedure: Insertion of Central Venous Catheter Indications: Assessment of intravascular volume, Drug and/or fluid administration and Frequent blood sampling  Procedure Details Consent: Unable to obtain consent because of emergent medical necessity.  Called husband.  No answer.  Time Out: Verified patient identification, verified procedure, site/side was marked, verified correct patient position, special equipment/implants available, medications/allergies/relevent history reviewed, required imaging and test results available.  Performed  Maximum sterile technique was used including antiseptics, cap, gloves, gown, hand hygiene, mask and sheet. Skin prep: Chlorhexidine; local anesthetic administered 4 ml 1%. A antimicrobial bonded/coated triple lumen catheter was placed in the right internal jugular vein using the Seldinger technique to 15 cm.  Biopatch placed.  Line sutured.  Sterile dressing applied.   Evaluation Blood flow good Complications: No apparent complications Patient did tolerate procedure well. Chest X-ray ordered to verify placement.  CXR: pending.  .Procedure performed with ultrasound guidance for real time vessel cannulation.     Kennieth Rad, AGACNP-BC [facility] Pgr: [phone] or if no answer [phone] 10/28/2017, 2:17 PM

## 2017-11-13 NOTE — ED Notes (Signed)
Pt given mouthswabs to moisten mouth and cool wash cloth

## 2017-11-13 NOTE — Plan of Care (Signed)
Care plan and education initated. No family at bedside at this time. Will continue to monitor. Lianne Bushy RN BSN.

## 2017-11-13 NOTE — Progress Notes (Signed)
Dr. Vaughan Browner made aware unable to monitor CVP's without central line. Plan at this time is to not place line. Order discontinued. Will continue to monitor. Lianne Bushy RN BSN.

## 2017-11-13 NOTE — H&P (Addendum)
PULMONARY / CRITICAL CARE MEDICINE   Name: Julia Nguyen MRN: [id number] DOB: 03/09/1946    ADMISSION DATE:  10/27/2017 CONSULTATION DATE:  11/12/2017  REFERRING MD:  Dr. 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  Underwent palliative radiotherapy and is currently on Keytruda by Dr. Julien Nordmann since [place] 2019.  [place] status [place] 6[place]18 evening.  Found to be hypotensive, hypoxic in the ED which improved initially with fluids.  Later decompensated with worsening shock requiring Levophed.  She is about to be intubated by EDP. Chest x-ray with bilateral infiltrates.  She has been started on empiric antibiotics.  PAST MEDICAL HISTORY :  She  has a past medical history of Adenocarcinoma of right lung, stage 4 ([place]) (10/04/2017), Arthritis, and Lower back pain.  PAST SURGICAL HISTORY: She  has a past surgical history that includes No past surgeries.  No Known Allergies  No current facility-administered medications on file prior to encounter.    Current Outpatient Medications on File Prior to Encounter  Medication Sig  . cyclobenzaprine (FLEXERIL) 10 MG tablet Take 10 mg by mouth 3 (three) times daily.  . diclofenac (VOLTAREN) 75 MG EC tablet Take 75 mg by mouth 2 (two) times daily.  Marland Kitchen ENSURE (ENSURE) Take 237 mLs by mouth 2 (two) times daily.   . Meclizine HCl (BONINE) 25 MG CHEW Chew 1 tablet by mouth every 8 (eight) hours as needed (dizziness).   . morphine (MSIR) 30 MG tablet Take 1 tablet (30 mg total) by mouth every 12 (twelve) hours.  . Multiple Vitamin (MULTIVITAMIN) tablet Take 1 tablet by mouth daily.  . nicotine (NICODERM CQ - DOSED IN MG/24 HOURS) 14 mg/24hr patch Place 1 patch (14 mg total) onto the skin daily.  . Nutritional Supplements (PROMOD) LIQD Give 30 ml by mouth two  times daily  . oxyCODONE (ROXICODONE) 15 MG immediate release tablet Take 1 tablet (15 mg total) by mouth every 3 (three) hours as needed for pain.  . pantoprazole (PROTONIX) 40 MG tablet Take 1 tablet (40 mg total) by mouth 2 (two) times daily.  . polyethylene glycol (MIRALAX / GLYCOLAX) packet Take 17 g by mouth daily as needed for mild constipation.  . senna-docusate (SENOKOT-S) 8.6-50 MG tablet Take 1 tablet by mouth 2 (two) times daily.  . traMADol (ULTRAM) 50 MG tablet Take 1 tablet (50 mg total) by mouth every 6 (six) hours as needed.    FAMILY HISTORY:  Her indicated that her mother is deceased. She indicated that her father is deceased.   SOCIAL HISTORY: She  reports that she quit smoking about 8 weeks ago. Her smoking use included cigarettes. She has a 92.00 pack-year smoking history. She has never used smokeless tobacco. She reports that she does not drink alcohol or use drugs.  REVIEW OF SYSTEMS:   Unable to obtain as patient is obtunded.  SUBJECTIVE:    VITAL SIGNS: BP (!) 100/58   Pulse (!) 102   Temp (!) 97.3 F (36.3 C) (Temporal)   Resp 19   Wt 129 lb (58.5 kg)   SpO2 100%   BMI 21.47 kg/m   HEMODYNAMICS:    VENTILATOR SETTINGS:    INTAKE / OUTPUT: I/O last 3 completed shifts: In: 1243.1 [IV Piggyback:1243.1] Out: -   PHYSICAL EXAMINATION: Blood pressure (!) 100/58, pulse Marland Kitchen)  102, temperature (!) 97.3 F (36.3 C), temperature source Temporal, resp. rate 19, weight 129 lb (58.5 kg), SpO2 100 %. Gen:      No acute distress HEENT:  EOMI, sclera anicteric Neck:     No masses; no thyromegaly Lungs:   Bilateral crackles, no wheeze CV:         Regular rate and rhythm; no murmurs Abd:      + bowel sounds; soft, non-tender; no palpable masses, no distension Ext:    No edema; adequate peripheral perfusion Skin:      Warm and dry; no rash Neuro: Unresponsive to pain.  LABS:  BMET Recent Labs  Lab 11/23/2017 0402  NA 133*  K 5.4*  CL 100*  CO2 18*   BUN 68*  CREATININE 3.44*  GLUCOSE 83    Electrolytes Recent Labs  Lab 11/02/2017 0402  CALCIUM 7.4*    CBC Recent Labs  Lab 11/22/2017 0402  WBC 11.7*  HGB 11.2*  HCT 35.7*  PLT 251    Coag's Recent Labs  Lab 11/14/2017 0402  INR 1.27    Sepsis Markers Recent Labs  Lab 11/10/2017 0429 11/19/2017 0736  LATICACIDVEN 1.78 1.89    ABG No results for input(s): PHART, PCO2ART, PO2ART in the last 168 hours.  Liver Enzymes Recent Labs  Lab 11/07/2017 0402  AST 26  ALT 39  ALKPHOS 141*  BILITOT 1.6*  ALBUMIN 1.7*    Cardiac Enzymes No results for input(s): TROPONINI, PROBNP in the last 168 hours.  Glucose No results for input(s): GLUCAP in the last 168 hours.  Imaging Dg Chest Port 1 View  Result Date: 11/07/2017 CLINICAL DATA:  72 y/o  F; increased shortness of breath. EXAM: PORTABLE CHEST 1 VIEW COMPARISON:  11/03/2017 chest radiograph.  10/14/2017 PET-CT. FINDINGS: Stable cardiac silhouette given projection and technique. Increased hazy and reticular opacities of the lungs. Peripheral right upper lobe pulmonary nodule as seen on prior PET-CT. No pleural effusion or pneumothorax. Bones are unremarkable. IMPRESSION: Increased hazy and reticular opacities of the lungs. Possibly worsening pneumonia or pulmonary edema. Electronically Signed   By: Kristine Garbe M.D.   On: 10/30/2017 06:36   Dg Chest Portable 1 View  Result Date: 11/01/2017 CLINICAL DATA:  Altered mental status. Decreased blood pressure. History of adenocarcinoma of the right lung. EXAM: PORTABLE CHEST 1 VIEW COMPARISON:  Head CT 10/14/2017.  CT chest 09/17/2017 FINDINGS: Heart size and pulmonary vascularity are normal. Airspace infiltration in the left mid lung likely representing focal pneumonia. No blunting of costophrenic angles. No pneumothorax. Mediastinal contours appear intact. IMPRESSION: Airspace disease in the left mid lung suggesting focal pneumonia. Electronically Signed   By: Lucienne Capers M.D.   On: 11/08/2017 04:17   STUDIES:    CULTURES: Ucx 6/19> Bcx 6/19> Urine strep 6/19 > Urine pneumococcus 6/19>> RVP 6/19>   ANTIBIOTICS: Vanco 6/19> Zosyn 6/19>  SIGNIFICANT EVENTS: 6/19- Admit, intubated  LINES/TUBES: ETT 6/19>   DISCUSSION: 72 year old with metastatic stage IV adenocarcinoma of the right lung with Keytruda admitted with acute respiratory failure, left lung infiltrate.  ASSESSMENT / PLAN:  PULMONARY A: Acute respiratory failure.  Intubated Differential diagnoses include infective process such asHCAP, pneumonitis from immunotherapy  P:   Chest x-ray, ABG post intubation Consider bronchoscopy if she is stable post intubation. Check respiratory virus panel, urine Legionella antigen, urine pneumococcus, procalcitonin Broad antibiotic coverage with Vanco, Zosyn  CARDIOVASCULAR A:  Septic shock P:  On levophed.  Wean if possible. Duc[place] need central line.  Check echo, troponin, BNP, cortisol Start stress dose steroids as she is likely adrenal insufficient due to adrenal mets  RENAL A:   AKI P:   Follow urine output and cr Renal US  GASTROINTESTINAL A:   Mild elevation in LFTs. [place] be from liver mets, shock ? Primary colon ca on PET scan P:   Check RUQ [place]. Follow LFTs Keep NPO, PPI coverage  HEMATOLOGIC A:   Leukocytosis P:  Monitor CBC  INFECTIOUS A:   Sepsis, presumed HCAP P:   Abx coverage as above  ENDOCRINE A:   Stable   P:    NEUROLOGIC A:   Needs sedation while on vent P:   RASS goal: 0 Fentanyl, versed PRN  FAMILY  - Updates: No family at bedside. Husband wants her full code as per ED. Called the number on records and left a voice mail requesting call back.  - Inter-disciplinary family meet or Palliative Care meeting due by:    The patient is critically ill with multiple organ system failure and requires high complexity decision making for assessment and support, frequent evaluation and titration of  therapies, advanced monitoring, review of radiographic studies and interpretation of complex data.   Critical Care Time devoted to patient care services, exclusive of separately billable procedures, described in this note is 35 minutes.   Marshell Garfinkel MD [facility] Pager [phone] If no answer or after 3pm call: [phone] 11/17/2017, 8:29 AM

## 2017-11-13 NOTE — Procedures (Signed)
Bronchoscopy Procedure Note Julia Nguyen [id number] January 18, 1946  Procedure: Bronchoscopy Indications: Diagnostic evaluation of the airways and Obtain specimens for culture and/or other diagnostic studies  Procedure Details Consent: Risks of procedure as well as the alternatives and risks of each were explained to the (patient/caregiver).  Consent for procedure obtained. Time Out: Verified patient identification, verified procedure, site/side was marked, verified correct patient position, special equipment/implants available, medications/allergies/relevent history reviewed, required imaging and test results available.  Performed  In preparation for procedure, patient was given 100% FiO2 and bronchoscope lubricated. Sedation: Benzodiazepines and Etomidate  Airway entered and the following bronchi were examined: RUL, RML, RLL, LUL, LLL and Bronchi.   Right side was clear, LLL was filled with purulent secretions which was suctioned out and cleared Procedures performed: BAL and bronchial washings LLL  Evaluation Hemodynamic Status: BP stable throughout; O2 sats: stable throughout Patient's Current Condition: stable Specimens:  Sent purulent fluid Complications: No apparent complications Patient did tolerate procedure well.   Narek Kniss 11/17/2017

## 2017-11-13 NOTE — Progress Notes (Signed)
CRITICAL VALUE ALERT  Critical Value:  Troponin   Date & Time Notied:  11/13/2016 1523  Provider Notified: Dr. Wonda Amis  Orders Received/Actions taken: No new orders received

## 2017-11-13 NOTE — ED Notes (Signed)
All blood cultures and labs have been drawn. PCXR at bedside.

## 2017-11-13 NOTE — Code Documentation (Signed)
See RT note for intubation details

## 2017-11-13 NOTE — Progress Notes (Signed)
Pharmacy Antibiotic Note  Julia Nguyen is a 72 y.o. female admitted on 11/02/2017 with sepsis.  Pharmacy has been consulted for Vancomycin/Zosyn dosing. From nursing facility with altered mental status. WBC mildly elevated. Acute renal failure.   Plan: Vancomycin 750 mg IV q48h Zosyn 2.25g IV q8h Trend WBC, temp, renal function  F/U infectious work-up Drug levels as indicated   Weight: 129 lb (58.5 kg)  Temp (24hrs), Avg:97.5 F (36.4 C), Min:97.3 F (36.3 C), Max:97.6 F (36.4 C)  Recent Labs  Lab 11/08/2017 0402 11/24/2017 0429  WBC 11.7*  --   CREATININE 3.44*  --   LATICACIDVEN  --  1.78    Estimated Creatinine Clearance: 13.3 mL/min (A) (by C-G formula based on SCr of 3.44 mg/dL (H)).    No Known Allergies   Narda Bonds 11/10/2017 5:57 AM

## 2017-11-13 NOTE — ED Provider Notes (Signed)
[facility] EMERGENCY DEPARTMENT Provider Note   CSN: [id number] Arrival date & time: 10/28/2017  0345     History   Chief Complaint Chief Complaint  Patient presents with  . Altered Mental Status   Level 5 caveat due to altered mental status HPI Julia Nguyen is a 72 y.o. female.  The history is provided by the patient and the EMS personnel. The history is limited by the condition of the patient.  Altered Mental Status   This is a new problem. Episode onset: Unknown. The problem has been gradually worsening. Associated symptoms include confusion.  Patient with history of lung cancer with bony mets, presents with nursing facility for altered mental status. Per report, patient slept all day, and woke up in the evening with altered mental status.  EMS had been called and on their arrival her blood pressure was 68 systolic.  She was given IV fluids with some improvement in her blood pressure.  Patient appears to be only alert to self.  She was hypoxic in route.  No other details are known at this time.  Past Medical History:  Diagnosis Date  . Adenocarcinoma of right lung, stage 4 ([place]) 10/04/2017  . Arthritis    "possibly in some joints" (09/19/2017)  . Lower back pain     Patient Active Problem List   Diagnosis Date Noted  . Abnormal CT of the abdomen 10/29/2017  . GERD without esophagitis 10/15/2017  . Polycythemia 10/15/2017  . Protein-calorie malnutrition, severe ([place]) 10/15/2017  . Adenocarcinoma of right lung, stage 4 ([place]) 10/04/2017  . Goals of care, counseling/discussion 10/04/2017  . Encounter for antineoplastic immunotherapy 10/04/2017  . Osteoarthritis of multiple joints 10/01/2017  . Current every day smoker 10/01/2017  . Cancer associated pain   . Metastatic cancer ([place])   . Primary adenocarcinoma of right lung ([place])   . Pathological fracture of lumbar vertebra 09/25/2017  . 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  Chronic constipation   . Leukocytosis   . Colonic mass   . Kidney lesion   . Pressure injury of skin 09/18/2017  . Lumbar burst fracture ([place]) 09/17/2017    Past Surgical History:  Procedure Laterality Date  . NO PAST SURGERIES       OB History   None      Home Medications    Prior to Admission medications   Medication Sig Start Date End Date Taking? Authorizing Provider  cyclobenzaprine (FLEXERIL) 10 MG tablet Take 10 mg by mouth 3 (three) times daily.    [provider]  diclofenac (VOLTAREN) 75 MG EC tablet Take 75 mg by mouth 2 (two) times daily.    [provider]  ENSURE (ENSURE) Take 237 mLs by mouth 2 (two) times daily.  10/07/17   [provider]  Meclizine HCl (BONINE) 25 MG CHEW Chew 1 tablet by mouth every 8 (eight) hours as needed (dizziness).     [provider]  morphine (MSIR) 30 MG tablet Take 1 tablet (30 mg total) by mouth every 12 (twelve) hours. 11/12/17   Gerlene Fee, NP  Multiple Vitamin (MULTIVITAMIN) tablet Take 1 tablet by mouth daily. 11/08/17   [provider]  nicotine (NICODERM CQ - DOSED IN MG/24 HOURS) 14 mg/24hr patch Place 1 patch (14 mg total) onto the skin daily. 10/01/17   Eugenie Filler, MD  Nutritional Supplements (PROMOD) LIQD Give 30 ml by mouth two times daily 11/07/17  [provider]  oxyCODONE (ROXICODONE) 15 MG immediate release tablet Take 1 tablet (15 mg total) by mouth every 3 (three) hours as needed for pain. 10/28/17   Gerlene Fee, NP  pantoprazole (PROTONIX) 40 MG tablet Take 1 tablet (40 mg total) by mouth 2 (two) times daily. 09/30/17   Eugenie Filler, MD  polyethylene glycol [facility] / Floria Raveling) packet Take 17 g by mouth daily as needed for mild constipation. 09/30/17   Eugenie Filler, MD  senna-docusate (SENOKOT-S) 8.6-50 MG tablet Take 1 tablet by mouth 2 (two) times daily. 09/30/17   Eugenie Filler, MD  traMADol (ULTRAM) 50 MG tablet Take 1 tablet (50 mg  total) by mouth every 6 (six) hours as needed. 10/01/17   Gerlene Fee, NP    Family History Family History  Problem Relation Age of Onset  . Heart disease Mother   . Lung cancer Father     Social History Social History   Tobacco Use  . Smoking status: Former Smoker    Packs/day: 2.00    Years: 46.00    Pack years: 92.00    Types: Cigarettes    Last attempt to quit: 09/17/2017    Years since quitting: 0.1  . Smokeless tobacco: Never Used  Substance Use Topics  . Alcohol use: No  . Drug use: No     Allergies   Patient has no known allergies.   Review of Systems Review of Systems  Unable to perform ROS: Mental status change  Psychiatric/Behavioral: Positive for confusion.     Physical Exam Updated Vital Signs BP (!) 83/45   Pulse (!) 108   Temp (!) 97.3 F (36.3 C) (Temporal)   Resp (!) 25   Wt 58.5 kg (129 lb)   SpO2 94%   BMI 21.47 kg/m   Physical Exam CONSTITUTIONAL: Elderly, chronically ill-appearing HEAD: Normocephalic/atraumatic EYES: EOMI/PERRL ENMT: Mucous membranes dry NECK: supple no meningeal signs CV: Tachycardic, no loud murmurs LUNGS: Crackles noted right base, mild tachypnea, hypoxia noted that is corrected with oxygen ABDOMEN: soft, nontender NEURO: Pt is awake/alert confused, mildly agitated EXTREMITIES: pulses normal/equal, no deformities Distal pulses intact by Doppler SKIN: Cool to touch, decreased cap refill in both feet PSYCH: Anxious  ED Treatments / Results  Labs (all labs ordered are listed, but only abnormal results are displayed) Labs Reviewed  COMPREHENSIVE METABOLIC PANEL - Abnormal; Notable for the following components:      Result Value   Sodium 133 (*)    Potassium 5.4 (*)    Chloride 100 (*)    CO2 18 (*)    BUN 68 (*)    Creatinine, Ser 3.44 (*)    Calcium 7.4 (*)    Total Protein 3.9 (*)    Albumin 1.7 (*)    Alkaline Phosphatase 141 (*)    Total Bilirubin 1.6 (*)    GFR calc non Af Amer 12 (*)     GFR calc Af Amer 14 (*)    All other components within normal limits  CBC WITH DIFFERENTIAL/PLATELET - Abnormal; Notable for the following components:   WBC 11.7 (*)    Hemoglobin 11.2 (*)    HCT 35.7 (*)    RDW 16.1 (*)    Neutro Abs 9.9 (*)    All other components within normal limits  PROTIME-INR - Abnormal; Notable for the following components:   Prothrombin Time 15.8 (*)    All other components within normal limits  URINALYSIS, ROUTINE W REFLEX MICROSCOPIC -  Abnormal; Notable for the following components:   Color, Urine AMBER (*)    APPearance CLOUDY (*)    Hgb urine dipstick LARGE (*)    Bilirubin Urine SMALL (*)    Protein, ur 100 (*)    Leukocytes, UA SMALL (*)    Trichomonas, UA PRESENT (*)    Non Squamous Epithelial 0-5 (*)    All other components within normal limits  CULTURE, BLOOD (ROUTINE X 2)  CULTURE, BLOOD (ROUTINE X 2)  URINE CULTURE  I-STAT CG4 LACTIC ACID, ED  I-STAT CG4 LACTIC ACID, ED    EKG EKG Interpretation  Date/Time:  Wednesday November 13 2017 03:53:29 EDT Ventricular Rate:  108 PR Interval:    QRS Duration: 90 QT Interval:  329 QTC Calculation: 441 R Axis:   74 Text Interpretation:  Sinus tachycardia Minimal ST elevation, inferior leads No previous ECGs available Interpretation limited secondary to artifact Confirmed by Ripley Fraise ([id number]) on 11/07/2017 4:19:20 AM   Radiology Dg Chest Portable 1 View  Result Date: 11/24/2017 CLINICAL DATA:  Altered mental status. Decreased blood pressure. History of adenocarcinoma of the right lung. EXAM: PORTABLE CHEST 1 VIEW COMPARISON:  Head CT 10/14/2017.  CT chest 09/17/2017 FINDINGS: Heart size and pulmonary vascularity are normal. Airspace infiltration in the left mid lung likely representing focal pneumonia. No blunting of costophrenic angles. No pneumothorax. Mediastinal contours appear intact. IMPRESSION: Airspace disease in the left mid lung suggesting focal pneumonia. Electronically Signed   By:  Lucienne Capers M.D.   On: 11/10/2017 04:17    Procedures Procedures  CRITICAL CARE Performed by: Sharyon Cable Total critical care time: 60 minutes Critical care time was exclusive of separately billable procedures and treating other patients. Critical care was necessary to treat or prevent imminent or life-threatening deterioration. Critical care was time spent personally by me on the following activities: development of treatment plan with patient and/or surrogate as well as nursing, discussions with consultants, evaluation of patient's response to treatment, examination of patient, obtaining history from patient or surrogate, ordering and performing treatments and interventions, ordering and review of laboratory studies, ordering and review of radiographic studies, pulse oximetry and re-evaluation of patient's condition. Patient with sepsis, , pneumonia with new oxygen requirement, hypotension requiring multiple re-evaluations.  Medications Ordered in ED Medications  vancomycin (VANCOCIN) IVPB 750 mg/150 ml premix (has no administration in time range)  piperacillin-tazobactam (ZOSYN) IVPB 2.25 g (has no administration in time range)  lactated ringers bolus 1,000 mL (1,000 mLs Intravenous New Bag/Given 10/31/2017 0640)  sodium chloride 0.9 % bolus 1,000 mL (0 mLs Intravenous Stopped 11/10/2017 0449)    And  sodium chloride 0.9 % bolus 1,000 mL (0 mLs Intravenous Stopped 10/31/2017 0515)  piperacillin-tazobactam (ZOSYN) IVPB 3.375 g (0 g Intravenous Stopped 11/20/2017 0446)  vancomycin (VANCOCIN) IVPB 1000 mg/200 mL premix ( Intravenous Stopped 11/10/2017 0520)  lactated ringers bolus 1,000 mL (0 mLs Intravenous Stopped 10/27/2017 0622)  LORazepam (ATIVAN) injection 0.5 mg (0.5 mg Intravenous Given 11/03/2017 0620)  norepinephrine (LEVOPHED) 4mg  in D5W 266mL premix infusion (5 mcg/min Intravenous New Bag/Given 11/12/2017 0654)     Initial Impression / Assessment and Plan / ED Course  I have reviewed the  triage vital signs and the nursing notes.  Pertinent labs & imaging results that were available during my care of the patient were reviewed by me and considered in my medical decision making (see chart for details).     4:44 AM Patient With altered mental status found to be hypotensive,  she also was hypoxic.  X-ray is consistent with pneumonia.  Code sepsis has been called and IV fluids and IV antibiotics have been ordered.  She will need to be admitted 5:43 AM Patient more awake alert this time, she has been having increasing cough.  She has dark sputum.  I have updated patient and her husband on findings of pneumonia and need to be admitted 6:17 AM Patient did have some improvement in blood pressure, but is now starting to drift down.  She is also having some hypoxia.  We will add on chest x-ray.  We will consult critical care.  Husband confirms that she is a full code. 7:16 AM Patient began to worsen with hypotension.  She received 4 L of normal saline, and Levophed drip.  She also became agitated but has responded to a small dose of Ativan.  Currently her blood pressure is in the 90s, she is resting comfortably and easily responsive. X-ray showing some signs of edema at this time, but her oxygenation is improved  I have reconsulted critical care Dr. Ashby Dawes about this patient  D/w critical care team Final Clinical Impressions(s) / ED Diagnoses   Final diagnoses:  Sepsis, due to unspecified organism ([place])  HCAP (healthcare-associated pneumonia)  AKI (acute kidney injury) [facility])    ED Discharge Orders    None       Ripley Fraise, MD 11/07/2017 [phone]

## 2017-11-14 ENCOUNTER — Inpatient Hospital Stay (HOSPITAL_COMMUNITY): Payer: Medicare Other

## 2017-11-14 LAB — ACID FAST SMEAR (AFB, MYCOBACTERIA): Acid Fast Smear: NEGATIVE

## 2017-11-14 LAB — BLOOD GAS, ARTERIAL
Acid-base deficit: 8.7 mmol/L — ABNORMAL HIGH (ref 0.0–2.0)
BICARBONATE: 15.4 mmol/L — AB (ref 20.0–28.0)
FIO2: 60
LHR: 26 {breaths}/min
MECHVT: 470 mL
O2 Saturation: 97.7 %
PATIENT TEMPERATURE: 100.3
PCO2 ART: 28.3 mmHg — AB (ref 32.0–48.0)
PEEP: 5 cmH2O
PO2 ART: 128 mmHg — AB (ref 83.0–108.0)
pH, Arterial: 7.362 (ref 7.350–7.450)

## 2017-11-14 LAB — BODY FLUID CELL COUNT WITH DIFFERENTIAL
EOS FL: 2 %
Lymphs, Fluid: 9 %
Monocyte-Macrophage-Serous Fluid: 32 % — ABNORMAL LOW (ref 50–90)
NEUTROPHIL FLUID: 57 % — AB (ref 0–25)
WBC FLUID: 1260 uL — AB (ref 0–1000)

## 2017-11-14 LAB — COMPREHENSIVE METABOLIC PANEL
ALK PHOS: 180 U/L — AB (ref 38–126)
ALT: 75 U/L — ABNORMAL HIGH (ref 14–54)
ANION GAP: 14 (ref 5–15)
AST: 117 U/L — ABNORMAL HIGH (ref 15–41)
Albumin: 1.4 g/dL — ABNORMAL LOW (ref 3.5–5.0)
BILIRUBIN TOTAL: 1.7 mg/dL — AB (ref 0.3–1.2)
BUN: 62 mg/dL — ABNORMAL HIGH (ref 6–20)
CALCIUM: 6.4 mg/dL — AB (ref 8.9–10.3)
CO2: 16 mmol/L — ABNORMAL LOW (ref 22–32)
Chloride: 105 mmol/L (ref 101–111)
Creatinine, Ser: 2.86 mg/dL — ABNORMAL HIGH (ref 0.44–1.00)
GFR calc non Af Amer: 15 mL/min — ABNORMAL LOW (ref >60)
GFR, EST AFRICAN AMERICAN: 18 mL/min — AB (ref >60)
Glucose, Bld: 120 mg/dL — ABNORMAL HIGH (ref 65–99)
POTASSIUM: 5.8 mmol/L — AB (ref 3.5–5.1)
SODIUM: 135 mmol/L (ref 135–145)
TOTAL PROTEIN: 3.5 g/dL — AB (ref 6.5–8.1)

## 2017-11-14 LAB — LACTIC ACID, PLASMA
Lactic Acid, Venous: 3.2 mmol/L (ref 0.5–1.9)
Lactic Acid, Venous: 3.6 mmol/L (ref 0.5–1.9)

## 2017-11-14 LAB — CBC
HEMATOCRIT: 34.4 % — AB (ref 36.0–46.0)
HEMOGLOBIN: 11.3 g/dL — AB (ref 12.0–15.0)
MCH: 28 pg (ref 26.0–34.0)
MCHC: 32.8 g/dL (ref 30.0–36.0)
MCV: 85.1 fL (ref 78.0–100.0)
Platelets: 299 10*3/uL (ref 150–400)
RBC: 4.04 MIL/uL (ref 3.87–5.11)
RDW: 16.1 % — AB (ref 11.5–15.5)
WBC: 24.6 10*3/uL — ABNORMAL HIGH (ref 4.0–10.5)

## 2017-11-14 LAB — PHOSPHORUS: Phosphorus: 5.3 mg/dL — ABNORMAL HIGH (ref 2.5–4.6)

## 2017-11-14 LAB — MAGNESIUM: MAGNESIUM: 1.8 mg/dL (ref 1.7–2.4)

## 2017-11-14 LAB — ACID FAST SMEAR (AFB)

## 2017-11-14 LAB — PNEUMOCYSTIS JIROVECI SMEAR BY DFA: Pneumocystis jiroveci Ag: NEGATIVE

## 2017-11-14 MED ORDER — MORPHINE 100MG IN NS 100ML (1MG/ML) PREMIX INFUSION
10.0000 mg/h | INTRAVENOUS | Status: DC
  Administered 2017-11-14 – 2017-11-15 (×4): 10 mg/h via INTRAVENOUS
  Filled 2017-11-14 (×4): qty 100

## 2017-11-14 MED ORDER — SODIUM CHLORIDE 0.9 % IV SOLN
10.0000 mg/h | INTRAVENOUS | Status: DC
  Filled 2017-11-14: qty 10

## 2017-11-14 MED ORDER — LACTATED RINGERS IV BOLUS
1000.0000 mL | Freq: Once | INTRAVENOUS | Status: AC
  Administered 2017-11-14: 1000 mL via INTRAVENOUS

## 2017-11-14 MED ORDER — GLYCOPYRROLATE 0.2 MG/ML IJ SOLN
0.2000 mg | INTRAMUSCULAR | Status: DC | PRN
  Administered 2017-11-14 – 2017-11-15 (×3): 0.2 mg via INTRAVENOUS
  Filled 2017-11-14 (×4): qty 1

## 2017-11-14 MED ORDER — MORPHINE BOLUS VIA INFUSION
5.0000 mg | INTRAVENOUS | Status: DC | PRN
  Administered 2017-11-14: 2 mg via INTRAVENOUS
  Administered 2017-11-14: 5 mg via INTRAVENOUS
  Administered 2017-11-15: 15 mg via INTRAVENOUS
  Administered 2017-11-15 (×2): 5 mg via INTRAVENOUS
  Administered 2017-11-15: 10 mg via INTRAVENOUS
  Administered 2017-11-15 (×2): 5 mg via INTRAVENOUS
  Filled 2017-11-14: qty 20

## 2017-11-14 NOTE — Progress Notes (Signed)
RT note-Awaiting withdrawal medications, no changes at this time.

## 2017-11-14 NOTE — Progress Notes (Signed)
Nutrition Brief Note  Chart reviewed. Per MD notes pt with poor prognosis. Pt is now a DNR and are awaiting the arrival of her sons. Per MD will likely transition to comfort care.  No nutrition interventions warranted at this time.  Please consult as needed.   [place], [place], [place] Pager [phone] After Hours Pager

## 2017-11-14 NOTE — Progress Notes (Signed)
Lactic acid result from around midnight of 3.6 (result that MD was notified of) did not carry over into epic. I called lab to verify that they have that value. They verified that they have that result, but are unsure why it isn't going over into Epic. Later lactic acid from 0354 was 3.2.

## 2017-11-14 NOTE — Progress Notes (Signed)
eLink Physician-Brief Progress Note Patient Name: Julia Nguyen DOB: May 14, 1946 MRN: [id number]   Date of Service  11/14/2017  HPI/Events of Note  Recent Labs  Lab 10/30/2017 0815 11/18/2017 0817 11/21/2017 1103 11/17/2017 1431  LATICACIDVEN  --  1.6 3.1* 2.7*  PROCALCITON 2.06  --   --   --    risnin lactate 3.6 now per RN  eICU Interventions  1L LR bolus and recheck in AM     Intervention Category Major Interventions: Other:  Brand Males 11/14/2017, 1:03 AM

## 2017-11-14 NOTE — Progress Notes (Signed)
Wasted 10 ml of fentanyl in the sink. Witnessed by Katherine Mantle, RN.

## 2017-11-14 NOTE — Progress Notes (Signed)
PULMONARY / CRITICAL CARE MEDICINE   Name: Julia Nguyen MRN: [id number] DOB: [birth year]    ADMISSION DATE:  11/09/2017 CONSULTATION DATE:  10/27/2017  REFERRING MD:  Dr. 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  Underwent palliative radiotherapy and is currently on Keytruda by Dr. Julien Nordmann since [place] 2019.  [place]/18 evening.  Found to be hypotensive, hypoxic in the ED which improved initially with fluids.  Later decompensated with worsening shock requiring Levophed.  She is about to be intubated by EDP. Chest x-ray with bilateral infiltrates.  She has been started on empiric antibiotics.  PAST MEDICAL HISTORY :  She  has a past medical history of Adenocarcinoma of right lung, stage 4 ([place]) (10/04/2017), Arthritis, and Lower back pain.  PAST SURGICAL HISTORY: She  has a past surgical history that includes No past surgeries.  No Known Allergies  No current facility-administered medications on file prior to encounter.    Current Outpatient Medications on File Prior to Encounter  Medication Sig  . cyclobenzaprine (FLEXERIL) 10 MG tablet Take 10 mg by mouth 3 (three) times daily.  . diclofenac (VOLTAREN) 75 MG EC tablet Take 75 mg by mouth 2 (two) times daily.  Marland Kitchen ENSURE (ENSURE) Take 237 mLs by mouth 2 (two) times daily.   . Meclizine HCl (BONINE) 25 MG CHEW Chew 1 tablet by mouth every 8 (eight) hours as needed (dizziness).   . morphine (MSIR) 30 MG tablet Take 1 tablet (30 mg total) by mouth every 12 (twelve) hours.  . Multiple Vitamin (MULTIVITAMIN) tablet Take 1 tablet by mouth daily.  . nicotine (NICODERM CQ - DOSED IN MG/24 HOURS) 14 mg/24hr patch Place 1 patch (14 mg total) onto the skin daily.  . Nutritional Supplements (PROMOD) LIQD Give 30 ml by mouth two  times daily  . oxyCODONE (ROXICODONE) 15 MG immediate release tablet Take 1 tablet (15 mg total) by mouth every 3 (three) hours as needed for pain.  . pantoprazole (PROTONIX) 40 MG tablet Take 1 tablet (40 mg total) by mouth 2 (two) times daily.  . polyethylene glycol (MIRALAX / GLYCOLAX) packet Take 17 g by mouth daily as needed for mild constipation.  . senna-docusate (SENOKOT-S) 8.6-50 MG tablet Take 1 tablet by mouth 2 (two) times daily.  . traMADol (ULTRAM) 50 MG tablet Take 1 tablet (50 mg total) by mouth every 6 (six) hours as needed.    FAMILY HISTORY:  Her indicated that her mother is deceased. She indicated that her father is deceased.   SOCIAL HISTORY: She  reports that she quit smoking about 8 weeks ago. Her smoking use included cigarettes. She has a 92.00 pack-year smoking history. She has never used smokeless tobacco. She reports that she does not drink alcohol or use drugs.  REVIEW OF SYSTEMS:   Unable to obtain as patient is obtunded.  SUBJECTIVE:  Worsening shock.  Now on Levophed and Neo-Synephrine IV fluids given for increasing lactic acid Underwent bronch with BAL yesterday Urine output continues to be poor.  VITAL SIGNS: BP 115/64   Pulse (!) 116   Temp (!) 102 F (38.9 C) (Axillary)   Resp 18   Ht _0  (1.626 m)   Wt 149 lb 4 oz (67.7 kg)   SpO2 99%   BMI 25.62 kg/m  HEMODYNAMICS: CVP:  [2 mmHg-20 mmHg] 11 mmHg  VENTILATOR SETTINGS: Vent Mode: PRVC FiO2 (%):  [60 %-100 %] 60 % Set Rate:  [20 bmp-26 bmp] 26 bmp Vt Set:  [430 mL-470 mL] 470 mL PEEP:  [5 cmH20] 5 cmH20 Plateau Pressure:  [13 cmH20-20 cmH20] 17 cmH20  INTAKE / OUTPUT: I/O last 3 completed shifts: In: 4942.6 [I.V.:1554; IV Piggyback:3388.6] Out: 720 [Urine:320; Emesis/NG output:400]  PHYSICAL EXAMINATION: Blood pressure 115/64, pulse (!) 116, temperature (!) 102 F (38.9 C), temperature source Axillary, resp. rate 18, height _0  (1.626 m), weight 149 lb 4 oz (67.7 kg), SpO2  99 %. Gen:      No acute distress, chronically ill-appearing HEENT:  EOMI, sclera anicteric Neck:     No masses; no thyromegaly, ET tube Lungs:    Clear to auscultation bilaterally; normal respiratory effort    CV:         Regular rate and rhythm; no murmurs Abd:      + bowel sounds; soft, non-tender; no palpable masses, no distension Ext:    No edema; adequate peripheral perfusion Skin:      Cyanotic extremities Neuro: Sedated, unresponsive  LABS:  BMET Recent Labs  Lab 11/19/2017 0402 11/06/2017 0815 11/14/17 0521  NA 133*  --  135  K 5.4*  --  5.8*  CL 100*  --  105  CO2 18*  --  16*  BUN 68*  --  62*  CREATININE 3.44* 2.85* 2.86*  GLUCOSE 83  --  120*    Electrolytes Recent Labs  Lab 10/31/2017 0402 11/14/17 0521  CALCIUM 7.4* 6.4*  MG  --  1.8  PHOS  --  5.3*    CBC Recent Labs  Lab 11/23/2017 0402 11/24/2017 0815 11/14/17 0521  WBC 11.7* 9.4 24.6*  HGB 11.2* 10.7* 11.3*  HCT 35.7* 33.7* 34.4*  PLT 251 212 299    Coag's Recent Labs  Lab 10/31/2017 0402  INR 1.27    Sepsis Markers Recent Labs  Lab 11/07/2017 0815  11/11/2017 1103 10/27/2017 1431 11/14/17 0354  LATICACIDVEN  --    < > 3.1* 2.7* 3.2*  PROCALCITON 2.06  --   --   --   --    < > = values in this interval not displayed.    ABG Recent Labs  Lab 11/08/2017 0847 11/06/2017 1452 11/14/17 0400  PHART 7.163* 7.174* 7.362  PCO2ART 45.6 38.5 28.3*  PO2ART 78.0* 111.0* 128*    Liver Enzymes Recent Labs  Lab 11/21/2017 0402 11/14/17 0521  AST 26 117*  ALT 39 75*  ALKPHOS 141* 180*  BILITOT 1.6* 1.7*  ALBUMIN 1.7* 1.4*    Cardiac Enzymes Recent Labs  Lab 11/12/2017 0815 11/12/2017 1412 11/18/2017 2038  TROPONINI <0.03 0.07* 0.24*    Glucose No results for input(s): GLUCAP in the last 168 hours.  Imaging US Abdomen Complete  Result Date: 11/08/2017 CLINICAL DATA:  Shock.  History of metastatic lung cancer. EXAM: ABDOMEN ULTRASOUND COMPLETE COMPARISON:  CT abdomen and pelvis September 17, 2017 and PET-CT Wert 20, 2019. FINDINGS: Gallbladder: Distended with layering sludge. No gallbladder wall thickening or pericholecystic fluid. No sonographic Murphy sign elicited. Common bile duct: Diameter: 4 mm Liver: Multiple small hypoechoic nodules throughout the liver measuring to 1.7 cm. Portal vein is patent on color Doppler imaging with normal direction of blood flow towards the liver. IVC: No abnormality visualized. Pancreas: Visualized portion unremarkable. Spleen: Size and appearance within normal limits. Right Kidney: Length: 11.1 cm. Echogenicity  within normal limits. No mass. Mild RIGHT hydronephrosis, increased from prior imaging. Left Kidney: Length: 12.6 cm. Echogenicity within normal limits. No mass or hydronephrosis visualized. Abdominal aorta: No aneurysm visualized. Other findings: Bilateral pleural effusions.  Trace ascites. IMPRESSION: 1. Gallbladder sludge without sonographic findings of acute cholecystitis. 2. Worsening mild RIGHT hydronephrosis. 3. Multiple hepatic metastasis again noted. 4. Bilateral pleural effusions.  Trace ascites. Electronically Signed   By: Elon Alas M.D.   On: 11/05/2017 14:18   Dg Chest Port 1 View  Result Date: 11/16/2017 CLINICAL DATA:  Central line placement.  Metastatic lung cancer. EXAM: PORTABLE CHEST 1 VIEW COMPARISON:  Radiograph of same day. FINDINGS: Stable cardiomediastinal silhouette. Endotracheal tube is unchanged in position. Interval placement of nasogastric tube seen entering stomach. Interval placement of right internal jugular catheter with distal tip in expected position of the SVC. No pneumothorax or significant pleural effusion is noted. Stable bibasilar atelectasis or infiltrates are noted. Bony thorax is unremarkable. IMPRESSION: Endotracheal tube unchanged in position. Interval placement of nasogastric tube. Interval placement of right internal jugular catheter with distal tip in expected position of the SVC. Stable bibasilar  atelectasis or infiltrates are noted. Electronically Signed   By: Marijo Conception, M.D.   On: 11/14/2017 15:12   Dg Chest Port 1 View  Result Date: 11/17/2017 CLINICAL DATA:  Hypoxia EXAM: PORTABLE CHEST 1 VIEW COMPARISON:  Study obtained earlier in the day FINDINGS: Endotracheal tube tip is 2.4 cm above the carina.  No pneumothorax. There is airspace consolidation throughout the left mid and lower lung zones, slightly increased. There is patchy airspace opacity in the right upper lobe as well as in the right base region, slightly increased in the right base region. There is consolidation throughout the medial left base, stable. Heart size normal. Pulmonary vascularity is within normal limits. No adenopathy. Bones are osteoporotic. There is degenerative change in each shoulder. IMPRESSION: Endotracheal tube as described without pneumothorax. Multifocal airspace consolidation, overall slightly increased from earlier in the day. Consolidation is greatest in the left mid and lower lung zones. Heart size within normal limits and stable. Electronically Signed   By: Lowella Grip III M.D.   On: 11/03/2017 09:11   STUDIES:   CULTURES: Ucx 6/19> Bcx 6/19> Urine strep 6/19 > Urine pneumococcus 6/19>> positive RVP 6/19>   ANTIBIOTICS: Vanco 6/19> Zosyn 6/19>  SIGNIFICANT EVENTS: 6/19- Admit, intubated  LINES/TUBES: ETT 6/19>   DISCUSSION: 72 year old with metastatic stage IV adenocarcinoma of the right lung with Keytruda admitted with acute respiratory failure, left lung infiltrate> likely pneumococcus pneumonia.  ASSESSMENT / PLAN:  PULMONARY A: Acute respiratory failure.  Intubated HCAP Urine pneumococcus test is positive. P:   Continue vent support Follow chest x-ray, BAL cultures Continue antibiotic coverage with Vanco, Zosyn.  CARDIOVASCULAR A:  Septic shock P:  On Levophed, vasopressin.  Wean if possible Follow CVP, troponin Bedside echo shows adequate LV  function. Continue stress steroids  RENAL A:   AKI P:   Renal ultrasound reviewed with mild right hydronephrosis.  She has known renal meds We will continue to monitor.  GASTROINTESTINAL A:   Mild elevation in LFTs. Feinberg be from liver mets, shock ? Primary colon ca on PET scan P:   Follow LFTs  HEMATOLOGIC A:   Leukocytosis P:  Monitor CBC  INFECTIOUS A:   Sepsis, presumed HCAP P:   Antibiotic coverage as above.  ENDOCRINE A:   Stable   P:    NEUROLOGIC A:   Needs sedation  while on vent P:   RASS goal: 0 Versed drip, fentanyl as needed.  FAMILY  - Updates: Discussed with husband 6/19.  He has stated that she would not want to be on life support.  They are aware of poor prognosis given her metastatic cancer CODE STATUS changed to DNR.  We are awaiting arrival of sons from [place].  Will likely transition to comfort care after they arrive - Inter-disciplinary family meet or Palliative Care meeting due by:    The patient is critically ill with multiple organ system failure and requires high complexity decision making for assessment and support, frequent evaluation and titration of therapies, advanced monitoring, review of radiographic studies and interpretation of complex data.   Critical Care Time devoted to patient care services, exclusive of separately billable procedures, described in this note is 35 minutes.   Marshell Garfinkel MD [facility] 11/14/2017, 9:05 AM

## 2017-11-14 NOTE — Progress Notes (Signed)
Discussed with husband and 2 sons who arrived today from [place] results to date showing GNR and pneumococcus pneumonia, multiorgan failure  Family made it clear that she did not want to be on life support and requested that we stop all interventions and transition to comfort care. Orders for comfort care placed.  Marshell Garfinkel MD [facility] 11/14/2017, 11:40 AM

## 2017-11-14 NOTE — Progress Notes (Signed)
RT note- per family and MD wishes ETT removed.

## 2017-11-14 NOTE — Progress Notes (Addendum)
CRITICAL VALUE ALERT  Critical Value:  Lactic acid 3.6  Date & Time Notied:  11/14/17 0050  Provider Notified: Yes E-Link MD  Orders Received/Actions taken: New orders for LR bolus.  E-Link MD also made aware of patient's toe color and capillary refill. No new orders received.   E-Link MD also made aware of patient's heart rate, no new orders received. Will continue to monitor.

## 2017-11-14 NOTE — Progress Notes (Signed)
Family wishes to proceed with transitioning to comfort care at this time. All questions were answered by MD and RN. Morphine gtt started and time allowed for medication to become therapeutic. Family wishes to stay at bedside during extubation. Patient extubated to room air at 1540 with no complications and is breathing comfortably. S/P extubation the patient continues to be comfortable and in NAD. Will continue to monitor patient.

## 2017-11-15 LAB — PATHOLOGIST SMEAR REVIEW

## 2017-11-15 LAB — CULTURE, BAL-QUANTITATIVE W GRAM STAIN

## 2017-11-15 LAB — CULTURE, BAL-QUANTITATIVE

## 2017-11-16 LAB — CULTURE, BAL-QUANTITATIVE W GRAM STAIN: Culture: >=100000 — AB

## 2017-11-16 LAB — URINE CULTURE: Culture: >=100000 — AB

## 2017-11-16 LAB — CULTURE, BAL-QUANTITATIVE

## 2017-11-18 ENCOUNTER — Other Ambulatory Visit: Payer: Medicare Other

## 2017-11-18 LAB — CULTURE, BLOOD (ROUTINE X 2)
Culture: NO GROWTH
Culture: NO GROWTH
Special Requests: ADEQUATE

## 2017-11-25 ENCOUNTER — Ambulatory Visit: Payer: Medicare Other | Admitting: Radiation Oncology

## 2017-11-25 NOTE — Progress Notes (Signed)
Narcotic Waste:  Morphine 1:1 gtt remained (50 mL) wasted into sink with Lianne Bushy RN.

## 2017-11-25 NOTE — Discharge Summary (Addendum)
Physician Death Summary  Patient ID: Julia Nguyen MRN: [id number] DOB/AGE: 1945/09/09 72 y.o.  Admit date: 11/24/2017 Discharge date: Nov 29, 2017  Admission Diagnoses:  Discharge Diagnoses:  Active Problems:   Acute respiratory failure (HCC) Septic shock Multiorgan failure Klebsiella pneumonia Stage IV lung cancer.  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  Underwent palliative radiotherapy and is currently on Keytruda by Dr. Julien Nordmann since [place] 2019.  [place]18 evening.  Found to be hypotensive, hypoxic in the ED which improved initially with fluids.  Later decompensated with worsening shock requiring Levophed.    She was intubated in the emergency room and PCCM consulted for admission  In the ICU she was in profound shock requiring Levophed and vasopressin.  Underwent bronchoscopy which showed Klebsiella pneumonia in the left lower lobe.  She was in multiorgan failure with no urine output  Discussed with husband and 2 sons who arrived from [place] results to date showing GNR and pneumococcus pneumonia, multiorgan failure Family made it clear that she did not want to be on life support and requested that we stop all interventions and transition to comfort care. Orders for comfort care placed.  Signed: Quaniya Damas 11-29-17, 9:39 AM

## 2017-11-25 NOTE — Progress Notes (Signed)
Pt passed away at 0835. She was not breathing, no pulse, asystole on EKG and no heart or lung sounds. This was confirmed with Budd Palmer RN. Family at bedside.

## 2017-11-25 DEATH — deceased

## 2017-11-26 ENCOUNTER — Ambulatory Visit: Payer: Medicare Other | Admitting: Oncology

## 2017-11-26 ENCOUNTER — Ambulatory Visit: Payer: Medicare Other

## 2017-11-26 ENCOUNTER — Other Ambulatory Visit: Payer: Medicare Other

## 2017-12-11 LAB — FUNGUS CULTURE WITH STAIN

## 2017-12-11 LAB — FUNGUS CULTURE RESULT

## 2017-12-11 LAB — FUNGAL ORGANISM REFLEX

## 2017-12-17 ENCOUNTER — Ambulatory Visit: Payer: Medicare Other

## 2017-12-17 ENCOUNTER — Other Ambulatory Visit: Payer: Medicare Other

## 2017-12-17 ENCOUNTER — Ambulatory Visit: Payer: Medicare Other | Admitting: Internal Medicine

## 2017-12-27 LAB — ACID FAST CULTURE WITH REFLEXED SENSITIVITIES (MYCOBACTERIA): Acid Fast Culture: NEGATIVE

## 2017-12-27 LAB — ACID FAST CULTURE WITH REFLEXED SENSITIVITIES

## 2018-01-07 ENCOUNTER — Ambulatory Visit: Payer: Medicare Other | Admitting: Internal Medicine

## 2018-01-07 ENCOUNTER — Ambulatory Visit: Payer: Medicare Other

## 2018-01-07 ENCOUNTER — Other Ambulatory Visit: Payer: Medicare Other

## 2018-01-28 ENCOUNTER — Ambulatory Visit: Payer: Medicare Other

## 2018-01-28 ENCOUNTER — Ambulatory Visit: Payer: Medicare Other | Admitting: Internal Medicine

## 2018-01-28 ENCOUNTER — Other Ambulatory Visit: Payer: Medicare Other

## 2018-09-29 IMAGING — PT NM PET TUM IMG INITIAL (PI) SKULL BASE T - THIGH
1 of 8 series · 1 of 25 positions shown · non-contrast
Comparison: 09/17/2017 CT chest, abdomen and pelvis.

CLINICAL DATA: Initial treatment strategy for non-small cell lung
cancer, based on L3 vertebral lesion biopsy on 09/19/2017 most
compatible with metastatic lung adenocarcinoma.

EXAM:
NUCLEAR MEDICINE PET SKULL BASE TO THIGH
TECHNIQUE: 7.2 mCi F-18 FDG was injected intravenously. Full-ring PET imaging
was performed from the skull base to thigh after the radiotracer. CT
data was obtained and used for attenuation correction and anatomic
localization.
Fasting blood glucose: 91 mg/dl

[Series 4: ct sk_thigh 5.0 b31f · axial · 5.0mm · 0.98mm/px · 1 of 238 slices shown]
[im 238/238  brain]
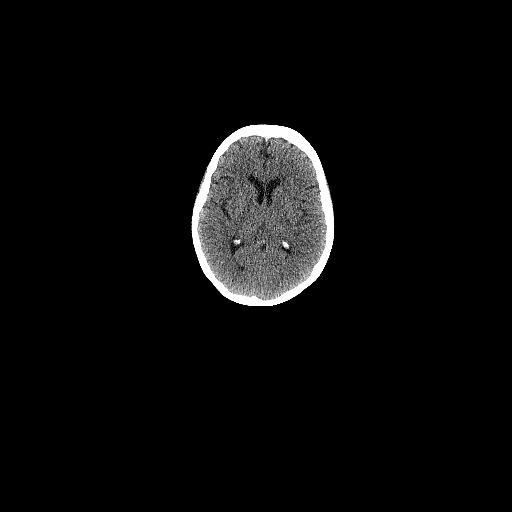

[1 of 25 positions shown; findings below may reference images not displayed]

FINDINGS: Mediastinal blood pool activity: SUV max

NECK: No hypermetabolic lymph nodes in the neck.

Incidental CT findings: Enlarged heterogeneous thyroid gland without
discrete thyroid nodules or thyroid hypermetabolism.

CHEST:

Spiculated solid peripheral right upper lobe 1.1 cm hypermetabolic
nodule with max SUV 4.9 (series 8/image 19).

Hypermetabolic right paratracheal adenopathy, with representative
2.6 cm right paratracheal node with max SUV 12.7 (series 4/image
66).

Hypermetabolic bilateral hilar adenopathy. Representative 2.0 cm
right hilar node with max SUV 20.5 (series 4/image 74).
Representative left hilar node with max SUV 9.5, poorly visualized
on the noncontrast CT images.

Hypermetabolic nonenlarged right prevascular mediastinal nodes.
Representative 0.7 cm right prevascular node with max SUV
(series 4/image 71).

Small soft tissue metastasis to the posterior left chest wall
musculature between the left scapula and left ribs with max SUV 8.8,
not delineated on the noncontrast CT images.

Incidental CT findings: At least 8 subcentimeter solid pulmonary
nodules scattered in both lungs, largest 6 mm in the left upper lobe
(series 8/image 40), below PET resolution. Severe centrilobular
emphysema with diffuse bronchial wall thickening. Trace dependent
left pleural effusion. Coronary atherosclerosis. Atherosclerotic
nonaneurysmal thoracic aorta. Dilated main pulmonary artery (3.7 cm
diameter).

ABDOMEN/PELVIS:

At least 4 hypermetabolic masses scattered in the liver.
Representative 1.1 cm superior right liver lobe mass with max SUV
8.7 (series 4/image 97). Representative 1.0 cm inferior right liver
lobe mass with max SUV 7.5 (series 4/image 118).

Hypermetabolic bilateral adrenal masses measuring 1.4 cm with max
SUV 16.3 on the right and 1.9 cm with max SUV 19.0 on the left.

Previously described indeterminate hypodense renal cortical lesions
in the upper right and posterior lower left kidney on 09/17/2017 CT
are not accurately assessed on the PET images, although appear to
demonstrate hypermetabolism.

Hypermetabolic 3.0 cm cecal mass with max SUV 16.6 (series 4/image
137). Hypermetabolic 0.9 cm right mesenteric node with max SUV
(series 4/image 143).

Hypermetabolic small soft tissue metastases in the left posterior
paraspinal musculature and bilateral proximal thigh musculature,
with representative left posterior paraspinal muscular soft tissue
metastasis with max SUV 8.1 and representative muscular soft tissue
metastasis medial to the proximal left femur with max SUV 5.8, not
discretely visualized on the noncontrast CT images..

Incidental CT findings: Atherosclerotic nonaneurysmal abdominal
aorta.

SKELETON: Numerous hypermetabolic lytic osseous lesions throughout
the axial and proximal appendicular skeleton, with representative
osseous lesions as follows:

-Proximal left femoral metaphysis lesion with max SUV

-medial right iliac bone lesion with max SUV

-upper right scapular lesion with max SUV

-lateral left fourth rib lesion with max SUV

-posterior L4 vertebral lesion with max SUV

-posterior T2 vertebral lesion with max SUV

-right C5-6 facet lesion with max SUV

Incidental CT findings: Redemonstration pathologic L3 vertebral
compression fracture.
IMPRESSION: 1. Hypermetabolic spiculated solid 1.1 cm peripheral right upper
lobe pulmonary nodule, compatible with primary bronchogenic
carcinoma.
2. Multiple (at least 8) subcentimeter pulmonary nodules scattered
in both lungs, below PET resolution, suspicious for bilateral
pulmonary metastases.
3. Hypermetabolic bilateral hilar and mediastinal nodal metastases.
4. Multiple small hypermetabolic liver metastases.
5. Hypermetabolic bilateral adrenal metastases.
6. Suggestion of hypermetabolism within the indeterminate upper
right and lower left renal cortical lesions described on 09/17/2017
CT study, suggesting malignant lesions, either renal metastases or
primary renal cell carcinomas.
7. Hypermetabolic 3.0 cm cecal mass, most compatible with primary
colonic carcinoma. Hypermetabolic right mesenteric adenopathy
suggest locoregional nodal metastasis from colonic carcinoma.
8. Widespread hypermetabolic lytic osseous metastases throughout the
axial and proximal appendicular skeleton as detailed.
Redemonstration of pathologic L3 vertebral compression fracture.
9. Multiple small hypermetabolic soft tissue metastases in the
posterior chest wall, paraspinal and bilateral proximal thigh
musculature.
10. Aortic Atherosclerosis (4HRB4-JF3.3) and Emphysema
([id number].I).

## 2018-10-29 IMAGING — DX DG CHEST 1V PORT
1 series · 1 of 1 positions shown · non-contrast
Comparison: 11/13/2017 chest radiograph.  10/14/2017 PET-CT.

CLINICAL DATA: 71 y/o  F; increased shortness of breath.

EXAM:
PORTABLE CHEST 1 VIEW

[chest ap]
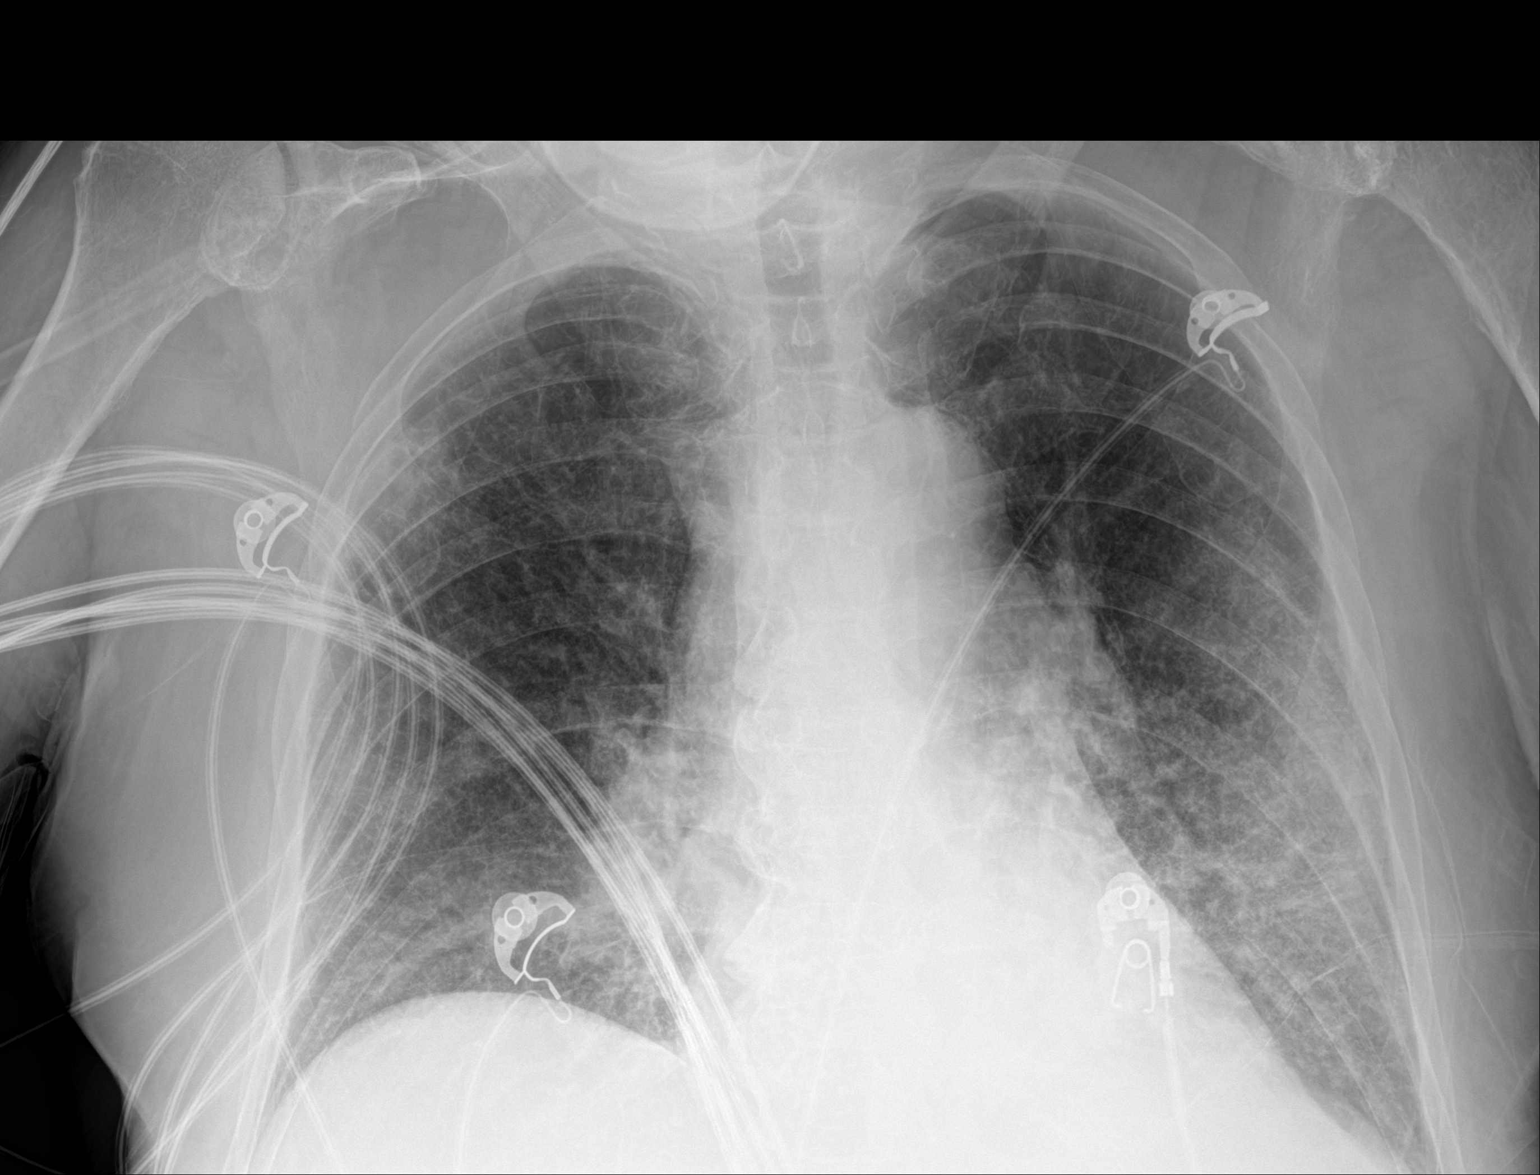

[1 of 1 positions shown; findings below may reference images not displayed]

FINDINGS: Stable cardiac silhouette given projection and technique. Increased
hazy and reticular opacities of the lungs. Peripheral right upper
lobe pulmonary nodule as seen on prior PET-CT. No pleural effusion
or pneumothorax. Bones are unremarkable.
IMPRESSION: Increased hazy and reticular opacities of the lungs. Possibly
worsening pneumonia or pulmonary edema.

By: Salma Ahmed Serry M.D.

## 2018-10-29 IMAGING — DX DG CHEST 1V PORT
1 series · 1 of 1 positions shown · non-contrast
Comparison: Study obtained earlier in the day

CLINICAL DATA: Hypoxia

EXAM:
PORTABLE CHEST 1 VIEW

[chest ap]
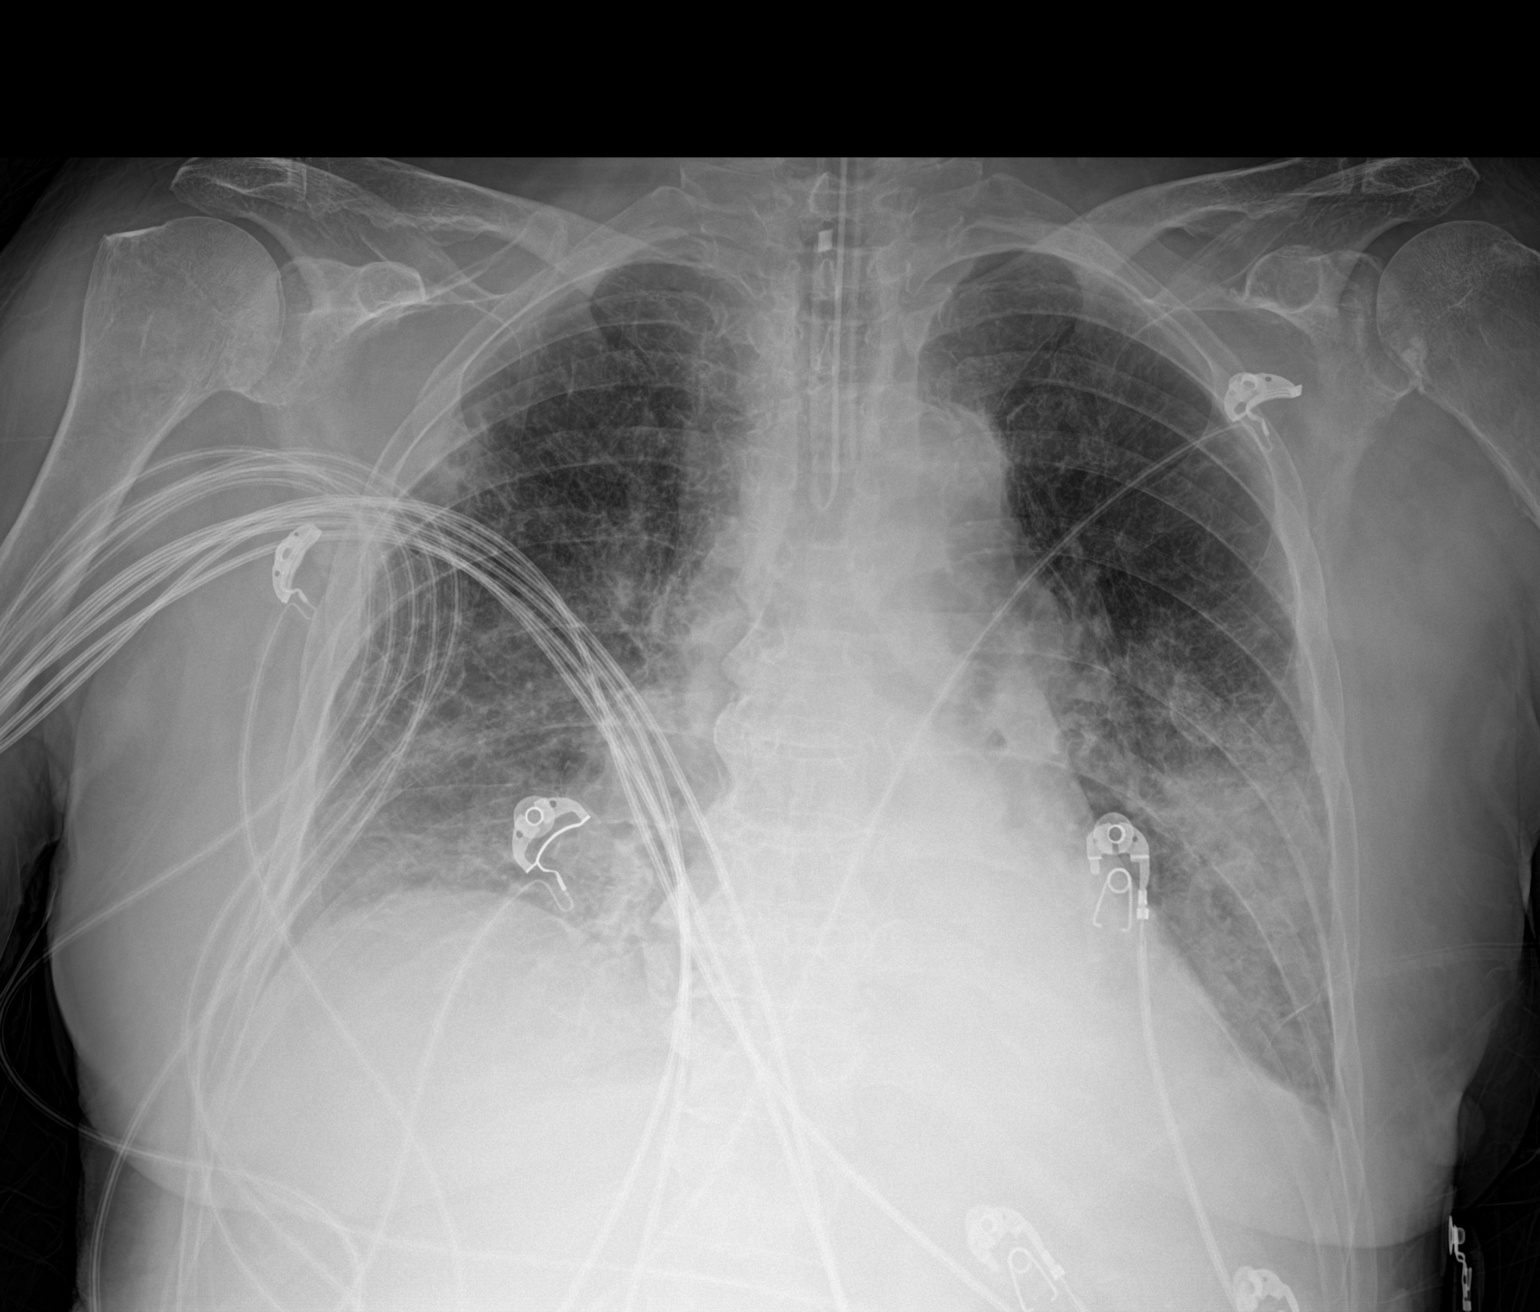

[1 of 1 positions shown; findings below may reference images not displayed]

FINDINGS: Endotracheal tube tip is 2.4 cm above the carina.  No pneumothorax.

There is airspace consolidation throughout the left mid and lower
lung zones, slightly increased. There is patchy airspace opacity in
the right upper lobe as well as in the right base region, slightly
increased in the right base region.

There is consolidation throughout the medial left base, stable.
Heart size normal. Pulmonary vascularity is within normal limits. No
adenopathy. Bones are osteoporotic. There is degenerative change in
each shoulder.
IMPRESSION: Endotracheal tube as described without pneumothorax. Multifocal
airspace consolidation, overall slightly increased from earlier in
the day. Consolidation is greatest in the left mid and lower lung
zones. Heart size within normal limits and stable.

## 2018-10-30 IMAGING — DX DG CHEST 1V PORT
1 series · 1 of 1 positions shown · non-contrast
Comparison: 11/13/2017

CLINICAL DATA: Acute respiratory failure

EXAM:
PORTABLE CHEST 1 VIEW

[chest ap]
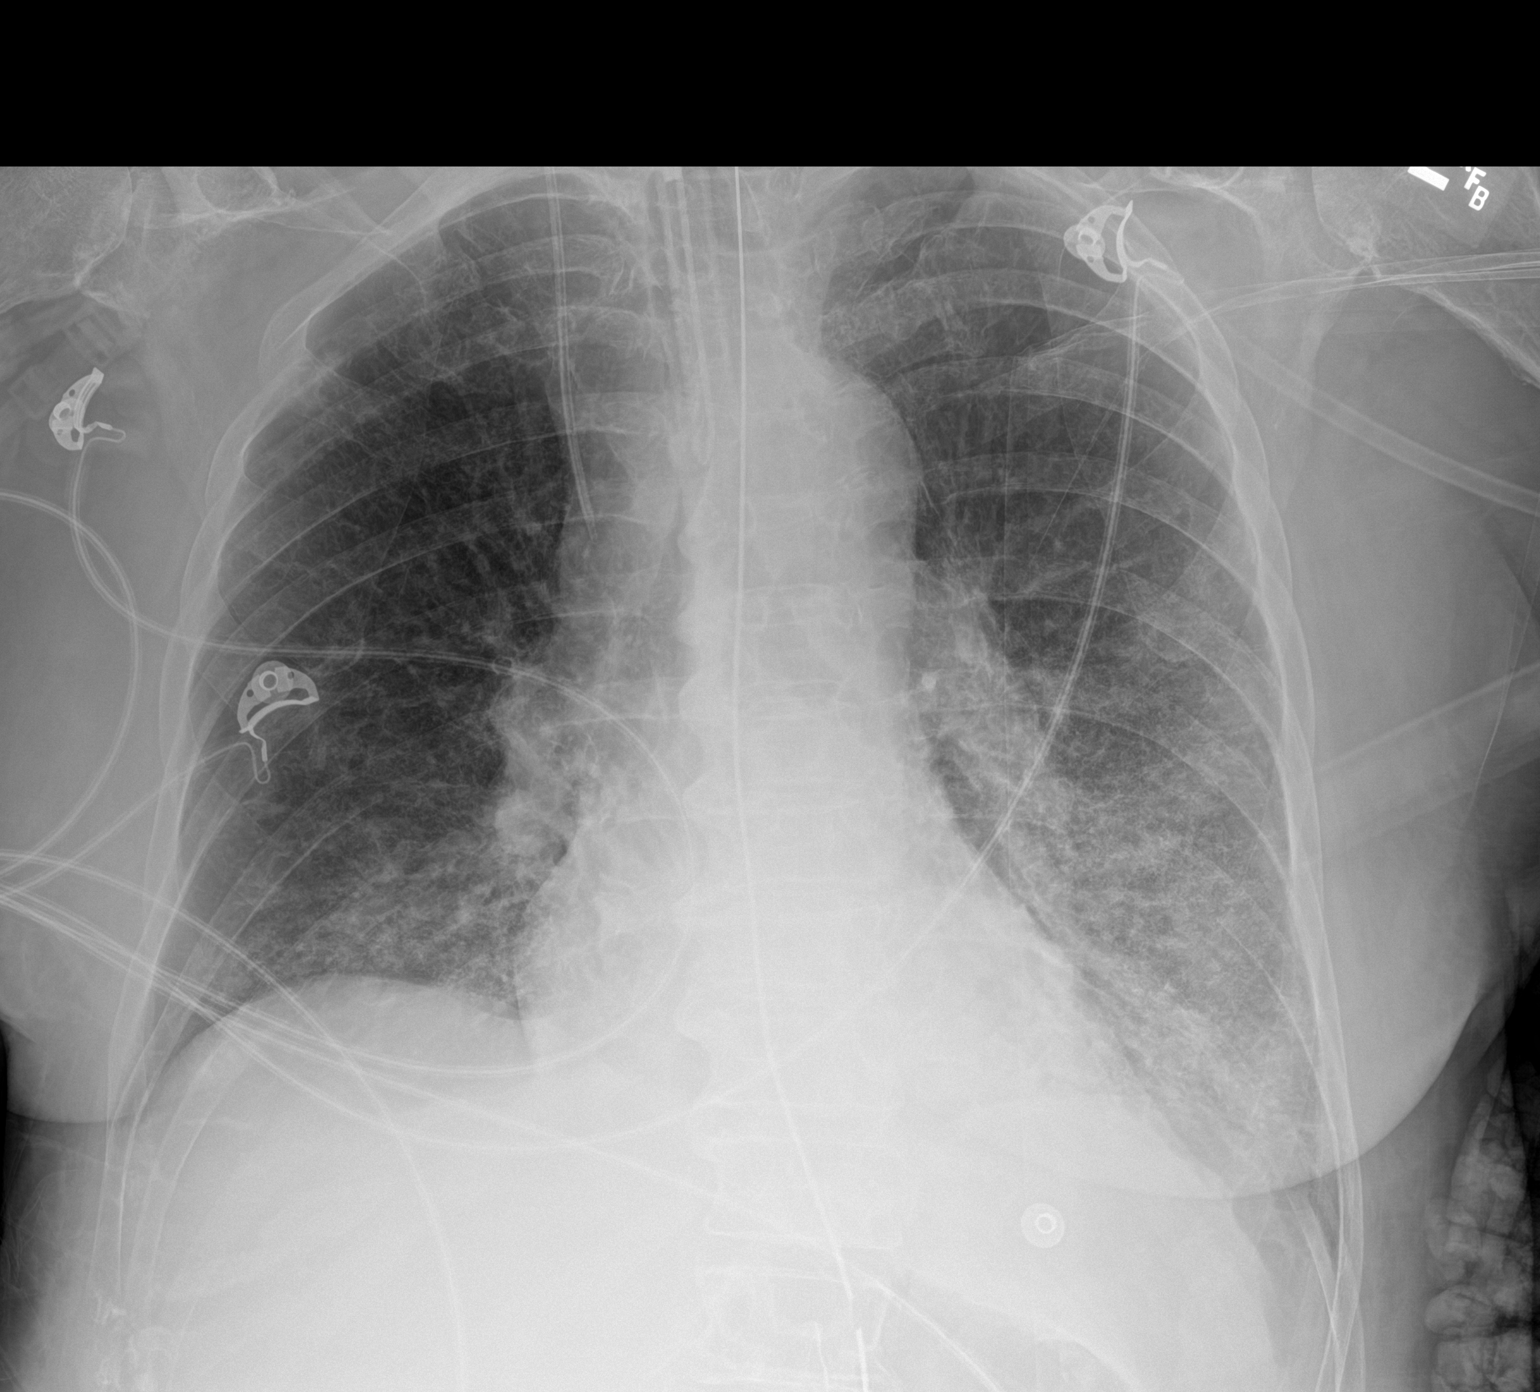

[1 of 1 positions shown; findings below may reference images not displayed]

FINDINGS: Cardiac shadow is stable. Endotracheal tube, nasogastric catheter
and right jugular central line are again seen and stable. Bibasilar
infiltrates are again identified but slightly increased when
compared with the prior exam particularly in the left base. No bony
abnormality is noted.
IMPRESSION: Increasing bibasilar infiltrates.

## 2019-06-26 IMAGING — US US ABDOMEN COMPLETE
1 series · 13 of 25 positions shown · non-contrast
Comparison: CT abdomen and pelvis September 17, 2017 and PET-CT October 14, 2017.

CLINICAL DATA: Shock.  History of metastatic lung cancer.

EXAM:
ABDOMEN ULTRASOUND COMPLETE

[Series 1: us abdomen complete · 0.25mm/px · 13 of 77 slices shown]
[im 1/77]
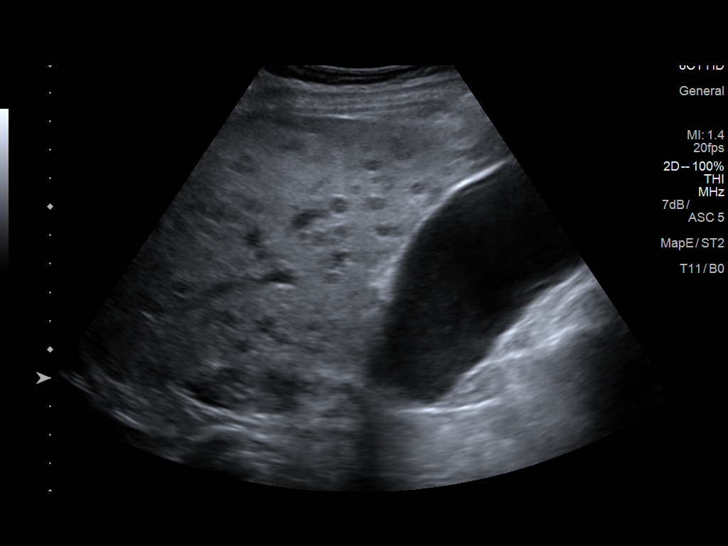
[im 7/77]
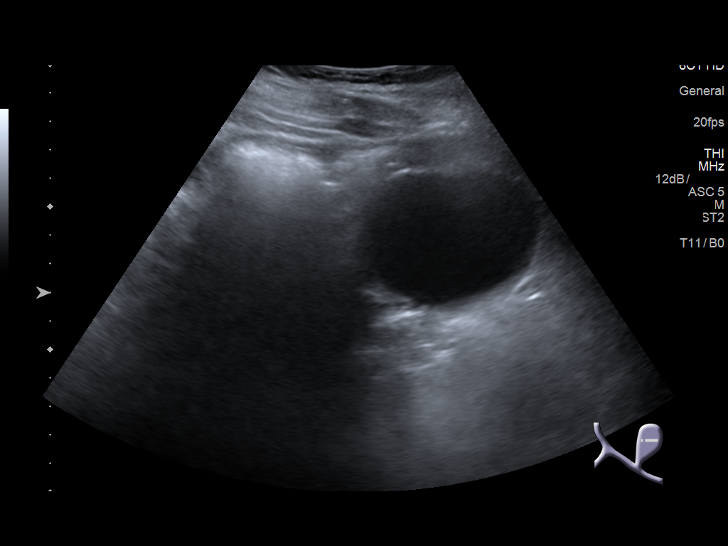
[im 13/77]
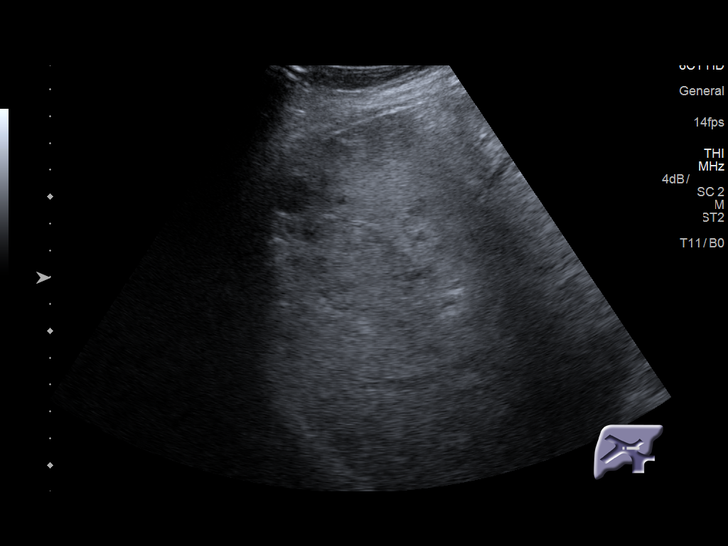
[im 20/77]
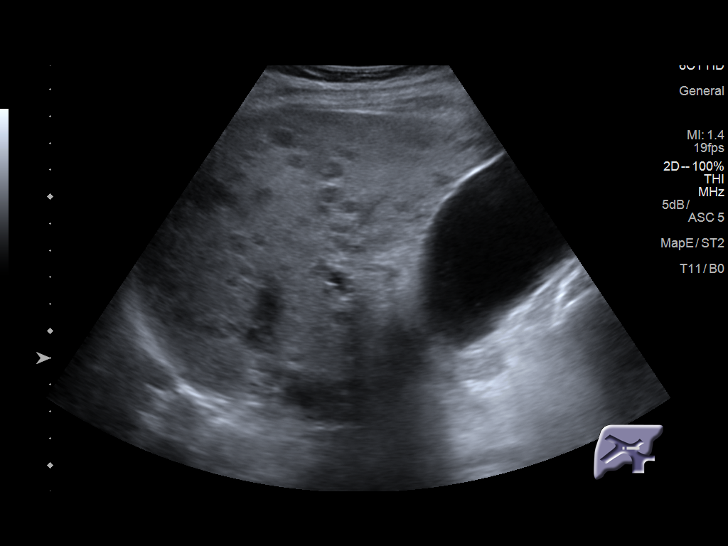
[im 26/77]
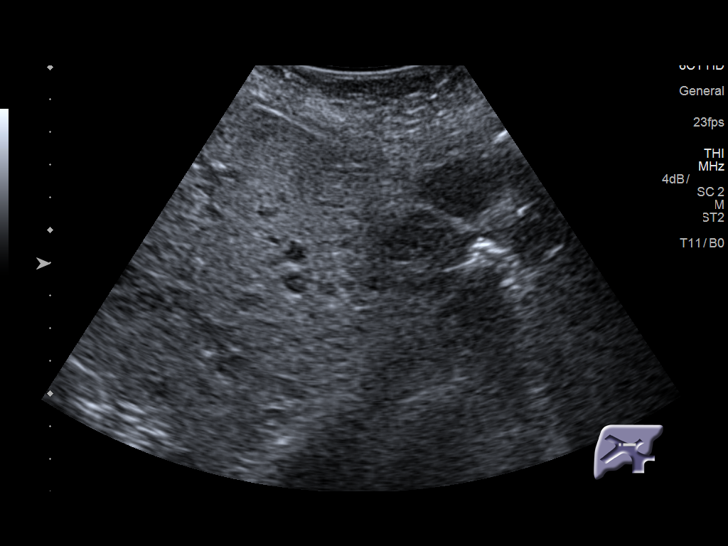
[im 32/77]
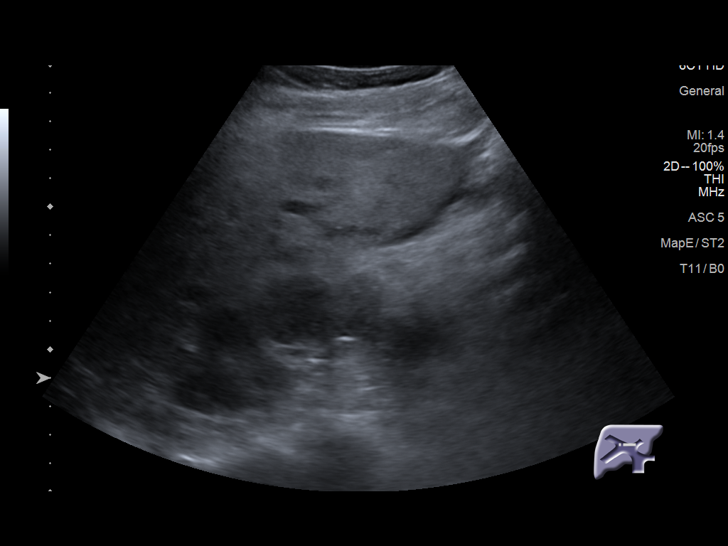
[im 39/77]
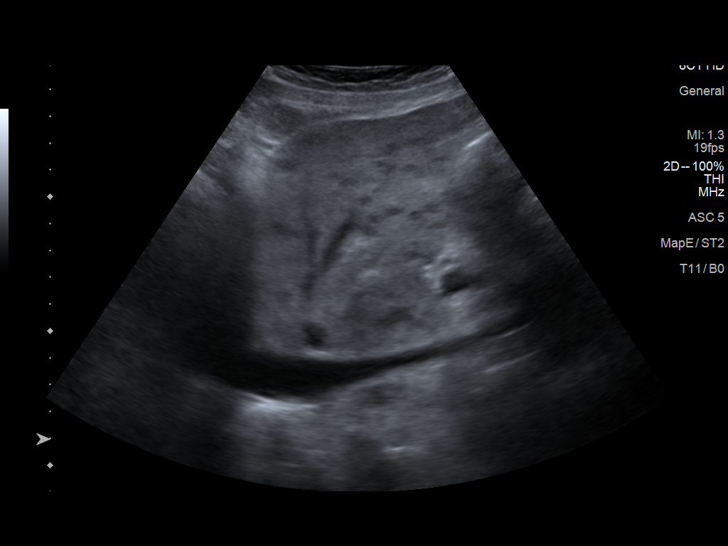
[im 45/77]
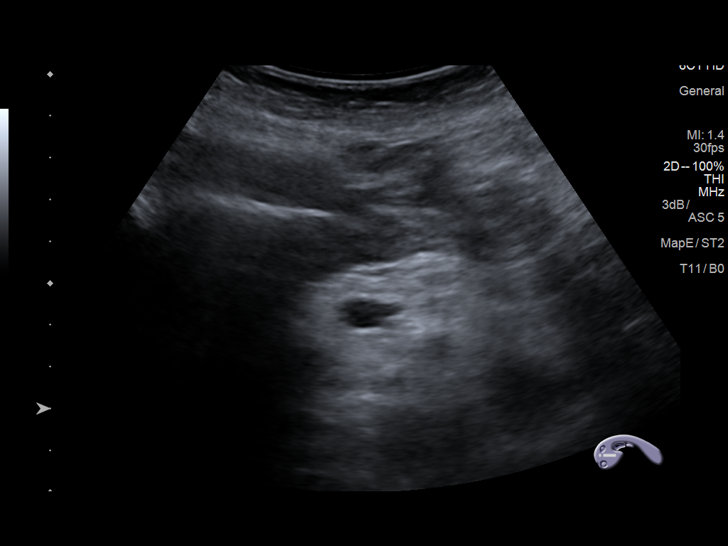
[im 51/77]
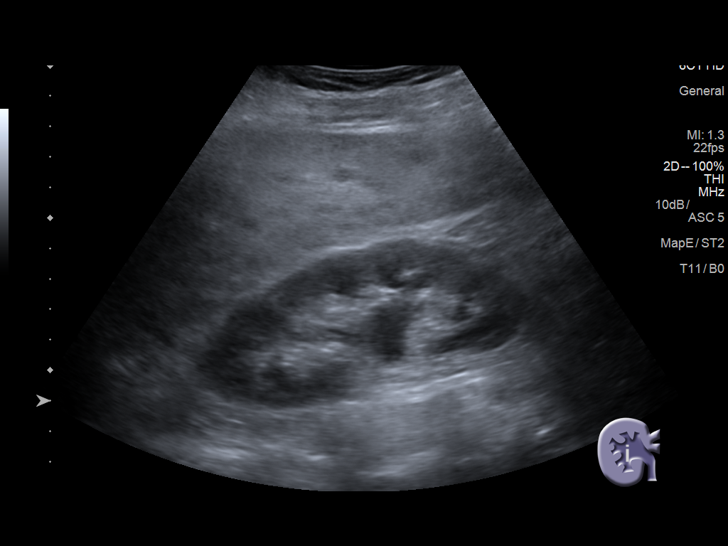
[im 58/77]
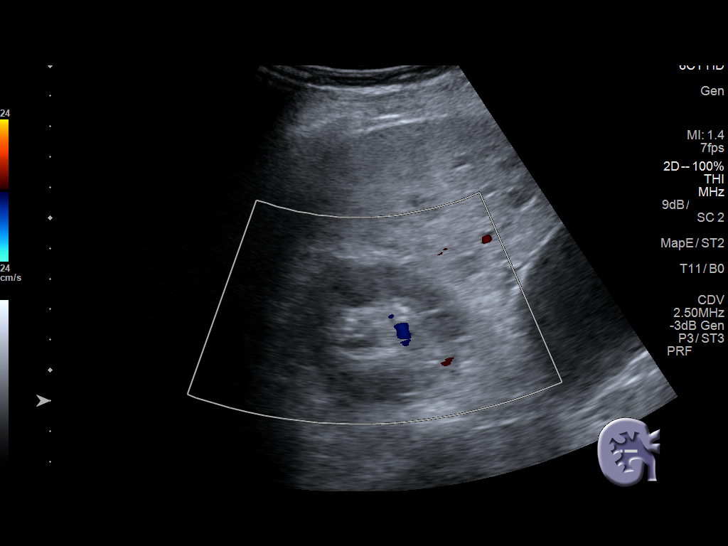
[im 64/77]
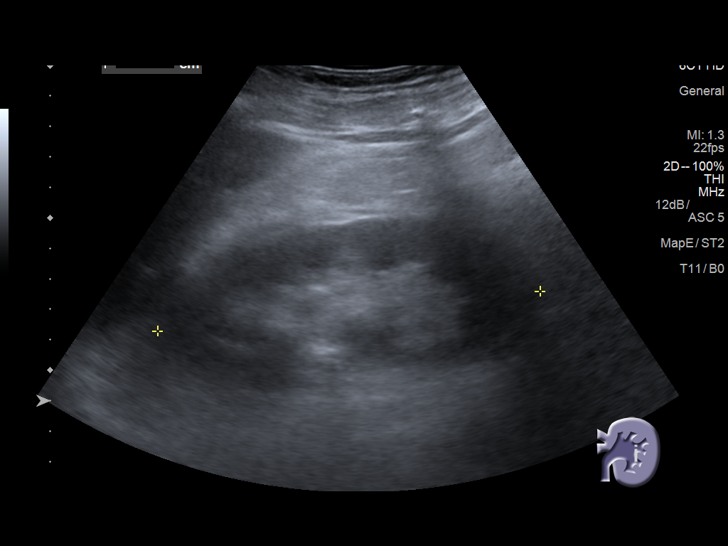
[im 70/77]
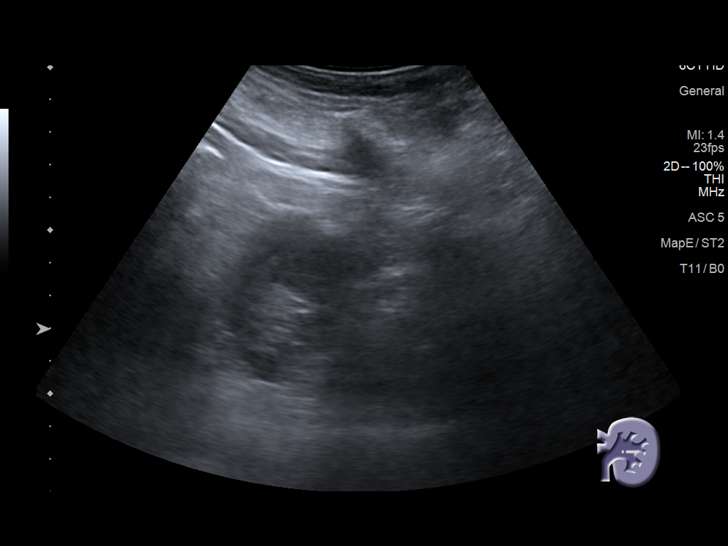
[im 77/77]
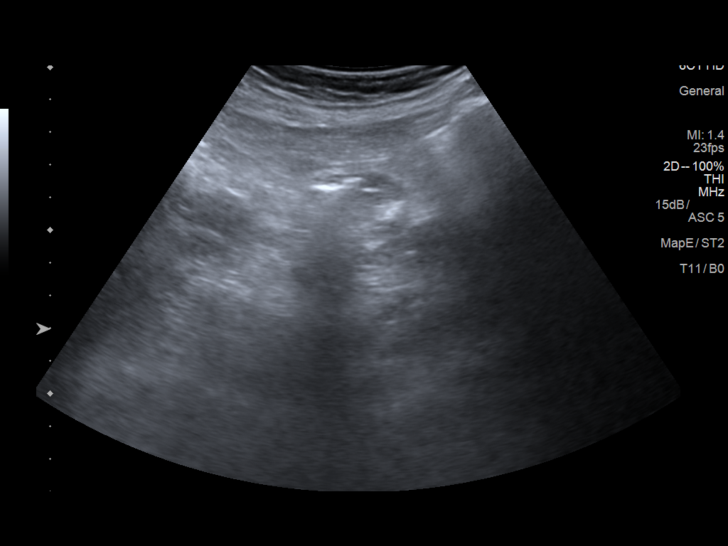

[13 of 25 positions shown; findings below may reference images not displayed]

FINDINGS: Gallbladder: Distended with layering sludge. No gallbladder wall
thickening or pericholecystic fluid. No sonographic Murphy sign
elicited.

Common bile duct: Diameter: 4 mm

Liver: Multiple small hypoechoic nodules throughout the liver
measuring to 1.7 cm. Portal vein is patent on color Doppler imaging
with normal direction of blood flow towards the liver.

IVC: No abnormality visualized.

Pancreas: Visualized portion unremarkable.

Spleen: Size and appearance within normal limits.

Right Kidney: Length: 11.1 cm. Echogenicity within normal limits. No
mass. Mild RIGHT hydronephrosis, increased from prior imaging.

Left Kidney: Length: 12.6 cm. Echogenicity within normal limits. No
mass or hydronephrosis visualized.

Abdominal aorta: No aneurysm visualized.

Other findings: Bilateral pleural effusions.  Trace ascites.
IMPRESSION: 1. Gallbladder sludge without sonographic findings of acute
cholecystitis.
2. Worsening mild RIGHT hydronephrosis.
3. Multiple hepatic metastasis again noted.
4. Bilateral pleural effusions.  Trace ascites.
